# Patient Record
Sex: Female | Born: 1950
Health system: Southern US, Community
[De-identification: ages and names within clinical notes are randomized; demographics above are authoritative.]

## PROBLEM LIST (undated history)

## (undated) DIAGNOSIS — R001 Bradycardia, unspecified: Secondary | ICD-10-CM

## (undated) DIAGNOSIS — E876 Hypokalemia: Secondary | ICD-10-CM

## (undated) DIAGNOSIS — I10 Essential (primary) hypertension: Secondary | ICD-10-CM

## (undated) DIAGNOSIS — Z0000 Encounter for general adult medical examination without abnormal findings: Secondary | ICD-10-CM

## (undated) DIAGNOSIS — E559 Vitamin D deficiency, unspecified: Secondary | ICD-10-CM

## (undated) DIAGNOSIS — M810 Age-related osteoporosis without current pathological fracture: Secondary | ICD-10-CM

## (undated) DIAGNOSIS — D259 Leiomyoma of uterus, unspecified: Secondary | ICD-10-CM

## (undated) DIAGNOSIS — D508 Other iron deficiency anemias: Secondary | ICD-10-CM

## (undated) DIAGNOSIS — D539 Nutritional anemia, unspecified: Secondary | ICD-10-CM

## (undated) DIAGNOSIS — D513 Other dietary vitamin B12 deficiency anemia: Secondary | ICD-10-CM

## (undated) DIAGNOSIS — N950 Postmenopausal bleeding: Secondary | ICD-10-CM

## (undated) DIAGNOSIS — Z1231 Encounter for screening mammogram for malignant neoplasm of breast: Secondary | ICD-10-CM

## (undated) DIAGNOSIS — E785 Hyperlipidemia, unspecified: Secondary | ICD-10-CM

## (undated) DIAGNOSIS — R634 Abnormal weight loss: Secondary | ICD-10-CM

## (undated) HISTORY — DX: Encounter for general adult medical examination without abnormal findings: Z00.00

## (undated) HISTORY — DX: Encounter for screening mammogram for malignant neoplasm of breast: Z12.31

## (undated) HISTORY — DX: Vitamin D deficiency, unspecified: E55.9

## (undated) HISTORY — DX: Nutritional anemia, unspecified: D53.9

## (undated) HISTORY — DX: Postmenopausal bleeding: N95.0

## (undated) HISTORY — DX: Age-related osteoporosis without current pathological fracture: M81.0

## (undated) HISTORY — DX: Hyperlipidemia, unspecified: E78.5

## (undated) HISTORY — DX: Other iron deficiency anemias: D50.8

## (undated) HISTORY — DX: Bradycardia, unspecified: R00.1

## (undated) HISTORY — DX: Essential (primary) hypertension: I10

## (undated) HISTORY — DX: Leiomyoma of uterus, unspecified: D25.9

## (undated) HISTORY — DX: Abnormal weight loss: R63.4

## (undated) HISTORY — DX: Hypokalemia: E87.6

## (undated) HISTORY — DX: Other dietary vitamin B12 deficiency anemia: D51.3

---

## 1999-02-04 ENCOUNTER — Other Ambulatory Visit: Admission: RE | Admit: 1999-02-04 | Discharge: 1999-02-04 | Payer: Self-pay | Admitting: Obstetrics and Gynecology

## 2000-08-06 ENCOUNTER — Other Ambulatory Visit: Admission: RE | Admit: 2000-08-06 | Discharge: 2000-08-06 | Payer: Self-pay | Admitting: Obstetrics and Gynecology

## 2002-10-02 ENCOUNTER — Other Ambulatory Visit: Admission: RE | Admit: 2002-10-02 | Discharge: 2002-10-02 | Payer: Self-pay | Admitting: Obstetrics and Gynecology

## 2003-10-11 ENCOUNTER — Encounter: Admission: RE | Admit: 2003-10-11 | Discharge: 2003-10-11 | Payer: Self-pay | Admitting: Obstetrics and Gynecology

## 2003-10-11 ENCOUNTER — Encounter: Payer: Self-pay | Admitting: Obstetrics and Gynecology

## 2003-10-27 ENCOUNTER — Ambulatory Visit (HOSPITAL_COMMUNITY): Admission: RE | Admit: 2003-10-27 | Discharge: 2003-10-27 | Payer: Self-pay | Admitting: Interventional Radiology

## 2004-03-10 ENCOUNTER — Other Ambulatory Visit: Admission: RE | Admit: 2004-03-10 | Discharge: 2004-03-10 | Payer: Self-pay | Admitting: Obstetrics and Gynecology

## 2004-12-21 HISTORY — PX: BREAST CYST EXCISION: SHX579

## 2005-02-09 ENCOUNTER — Emergency Department (HOSPITAL_COMMUNITY): Admission: EM | Admit: 2005-02-09 | Discharge: 2005-02-09 | Payer: Self-pay | Admitting: Family Medicine

## 2005-05-12 ENCOUNTER — Other Ambulatory Visit: Admission: RE | Admit: 2005-05-12 | Discharge: 2005-05-12 | Payer: Self-pay | Admitting: Obstetrics and Gynecology

## 2006-10-25 ENCOUNTER — Ambulatory Visit: Payer: Self-pay | Admitting: Internal Medicine

## 2006-10-25 LAB — CONVERTED CEMR LAB
ALT: 13 units/L (ref 0–40)
Alkaline Phosphatase: 51 units/L (ref 39–117)
BUN: 14 mg/dL (ref 6–23)
Basophils Relative: 0.9 % (ref 0.0–1.0)
Bilirubin Urine: NEGATIVE
Calcium: 9.4 mg/dL (ref 8.4–10.5)
Chloride: 104 meq/L (ref 96–112)
Cholesterol: 173 mg/dL (ref 0–200)
Eosinophil percent: 3.4 % (ref 0.0–5.0)
Glomerular Filtration Rate, Af Am: 112 mL/min/{1.73_m2}
Glucose, Bld: 91 mg/dL (ref 70–99)
HCT: 40 % (ref 36.0–46.0)
HDL: 61.2 mg/dL (ref >39.0)
Hemoglobin, Urine: NEGATIVE
Hemoglobin: 13.2 g/dL (ref 12.0–15.0)
LDL Cholesterol: 106 mg/dL — ABNORMAL HIGH (ref 0–99)
Leukocytes, UA: NEGATIVE
Lymphocytes Relative: 34.6 % (ref 12.0–46.0)
MCV: 83.2 fL (ref 78.0–100.0)
Monocytes Absolute: 0.4 10*3/uL (ref 0.2–0.7)
Neutrophils Relative %: 48.9 % (ref 43.0–77.0)
Platelets: 235 10*3/uL (ref 150–400)
Potassium: 3.5 meq/L (ref 3.5–5.1)
TSH: 0.62 microintl units/mL (ref 0.35–5.50)
Total Protein: 7.6 g/dL (ref 6.0–8.3)
Triglyceride fasting, serum: 30 mg/dL (ref 0–149)
VLDL: 6 mg/dL (ref 0–40)
WBC: 3.2 10*3/uL — ABNORMAL LOW (ref 4.5–10.5)
pH: 7 (ref 5.0–8.0)

## 2006-11-01 ENCOUNTER — Ambulatory Visit: Payer: Self-pay | Admitting: Internal Medicine

## 2007-05-11 ENCOUNTER — Other Ambulatory Visit: Admission: RE | Admit: 2007-05-11 | Discharge: 2007-05-11 | Payer: Self-pay | Admitting: Obstetrics and Gynecology

## 2008-03-21 ENCOUNTER — Encounter: Payer: Self-pay | Admitting: Internal Medicine

## 2008-04-17 ENCOUNTER — Encounter: Payer: Self-pay | Admitting: Internal Medicine

## 2008-10-02 ENCOUNTER — Ambulatory Visit: Payer: Self-pay | Admitting: Obstetrics and Gynecology

## 2008-10-02 ENCOUNTER — Encounter: Payer: Self-pay | Admitting: Obstetrics and Gynecology

## 2008-10-02 ENCOUNTER — Other Ambulatory Visit: Admission: RE | Admit: 2008-10-02 | Discharge: 2008-10-02 | Payer: Self-pay | Admitting: Obstetrics and Gynecology

## 2009-01-08 ENCOUNTER — Ambulatory Visit: Payer: Self-pay | Admitting: Obstetrics and Gynecology

## 2009-05-24 ENCOUNTER — Emergency Department (HOSPITAL_COMMUNITY): Admission: EM | Admit: 2009-05-24 | Discharge: 2009-05-25 | Payer: Self-pay | Admitting: Emergency Medicine

## 2010-01-22 ENCOUNTER — Encounter: Payer: Self-pay | Admitting: Internal Medicine

## 2010-02-21 IMAGING — CR DG FOOT COMPLETE 3+V*L*
3 series · 3 of 3 positions shown · non-contrast
Comparison: None.

CLINICAL DATA: Lateral left foot pain following a fall.

LEFT FOOT - COMPLETE 3+ VIEW

[t foot ap left]
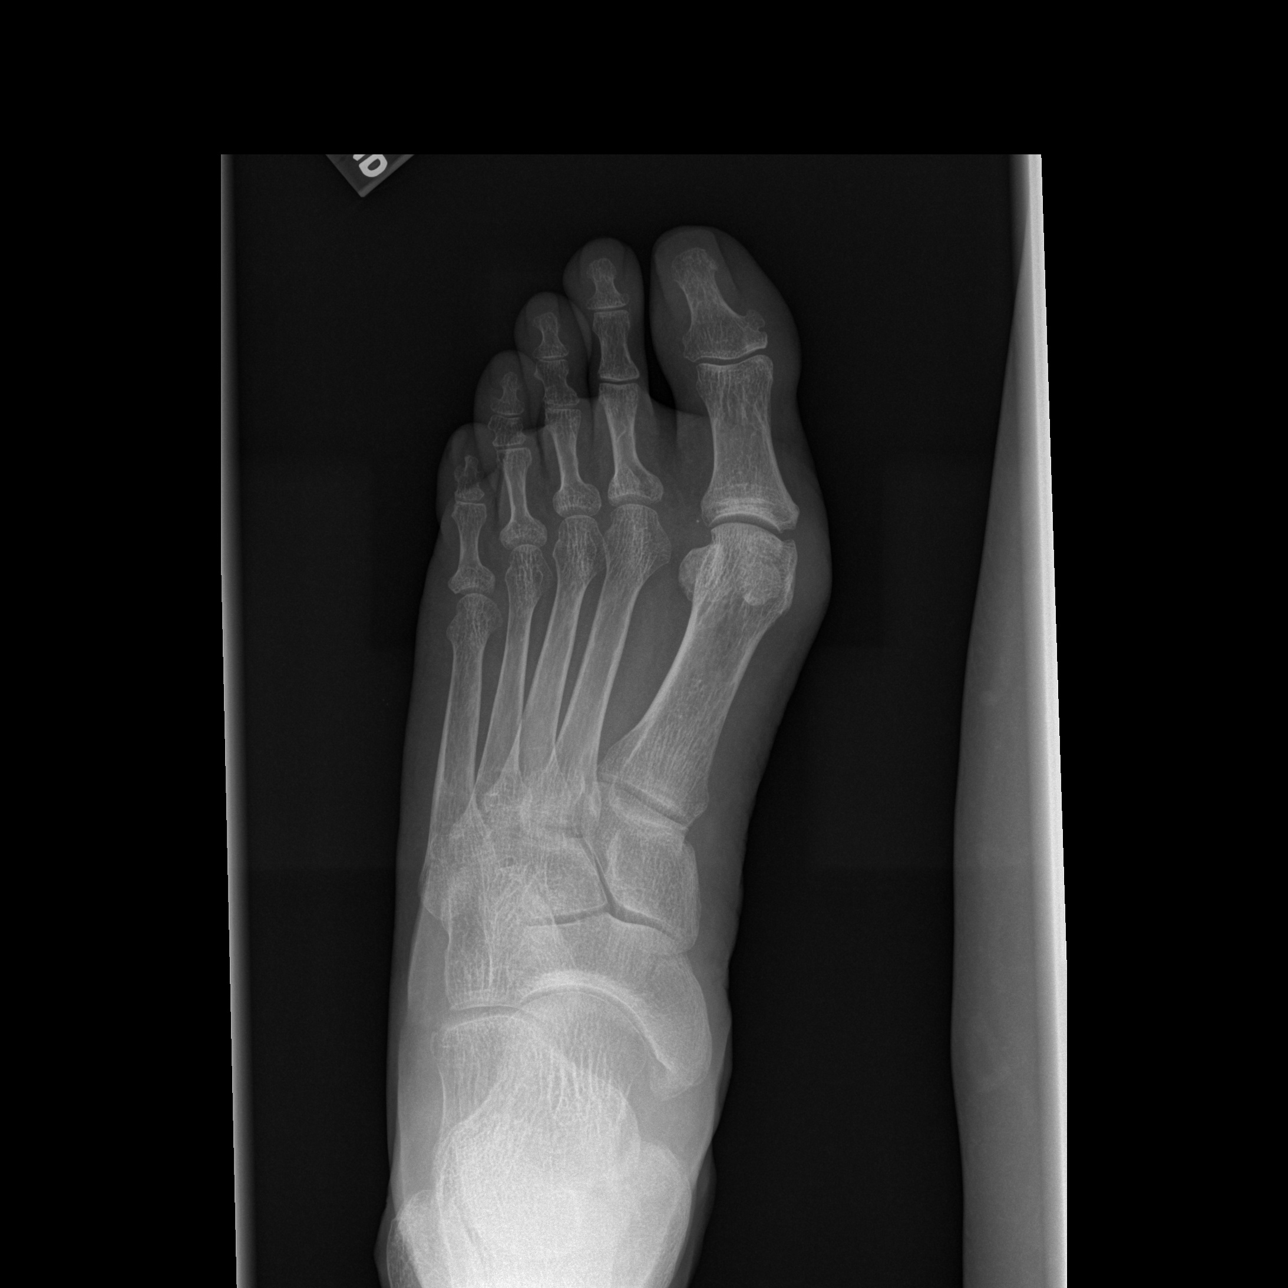

[t foot oblique left]
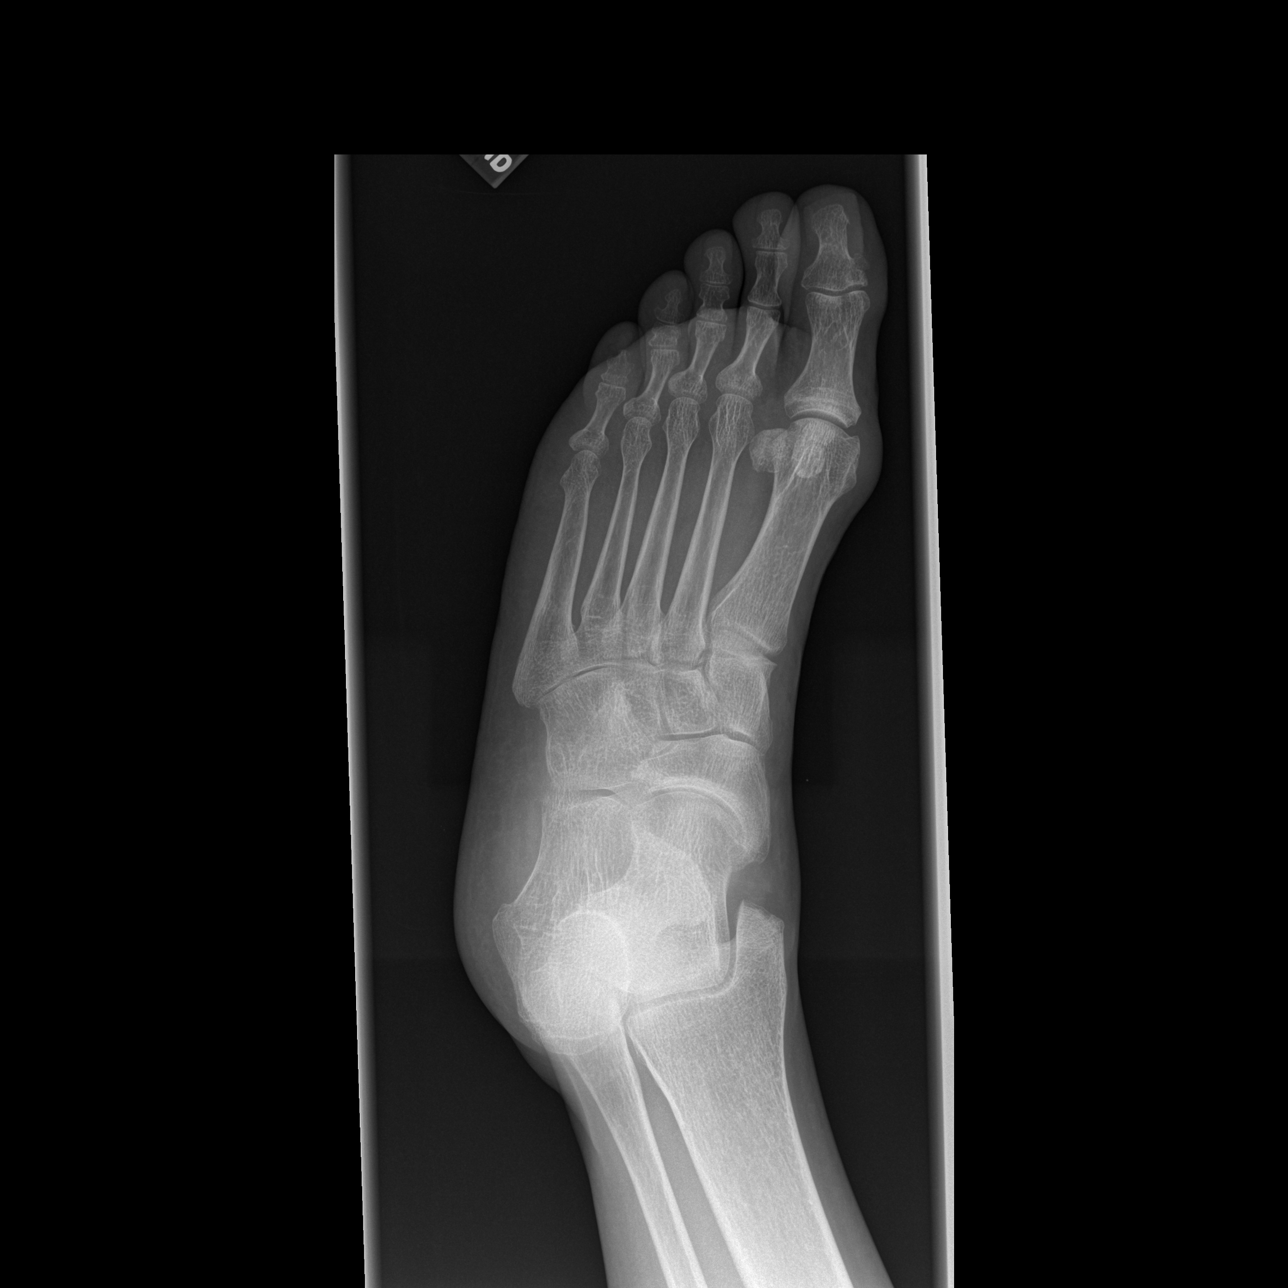

[t foot lat left]
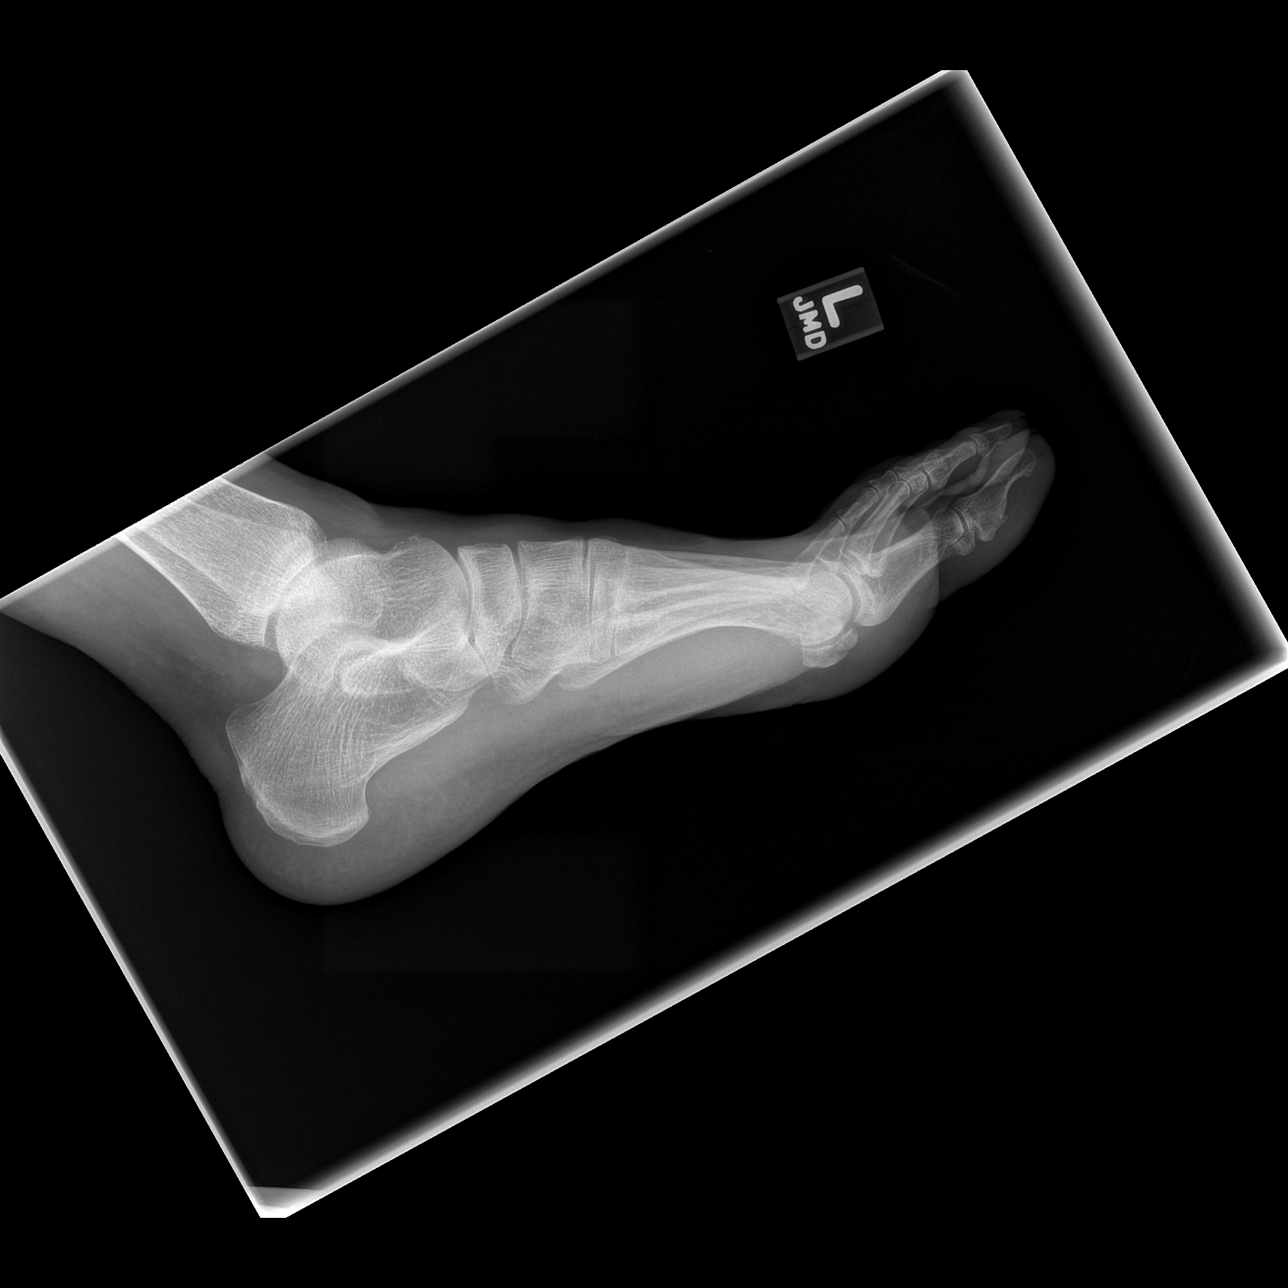

[3 of 3 positions shown; findings below may reference images not displayed]

FINDINGS: Mild hallux valgus deformity and mild spur formation at
the medial aspect of the first MTP joint.  No fracture or
dislocation seen.
IMPRESSION: 1.  No fracture.
2.  Mild hallux valgus deformity and mild degenerative changes of
the first MTP joint.

## 2010-07-02 ENCOUNTER — Emergency Department (HOSPITAL_COMMUNITY): Admission: EM | Admit: 2010-07-02 | Discharge: 2010-07-02 | Payer: Self-pay | Admitting: Family Medicine

## 2010-11-18 ENCOUNTER — Ambulatory Visit: Payer: Self-pay | Admitting: Obstetrics and Gynecology

## 2010-11-18 ENCOUNTER — Other Ambulatory Visit
Admission: RE | Admit: 2010-11-18 | Discharge: 2010-11-18 | Payer: Self-pay | Source: Home / Self Care | Admitting: Obstetrics and Gynecology

## 2011-01-10 ENCOUNTER — Encounter: Payer: Self-pay | Admitting: Interventional Radiology

## 2011-03-03 ENCOUNTER — Other Ambulatory Visit: Payer: Self-pay

## 2011-03-10 ENCOUNTER — Encounter: Payer: Self-pay | Admitting: Internal Medicine

## 2011-05-06 ENCOUNTER — Other Ambulatory Visit: Payer: Self-pay

## 2011-05-06 ENCOUNTER — Other Ambulatory Visit: Payer: Self-pay | Admitting: Internal Medicine

## 2011-05-06 DIAGNOSIS — Z0000 Encounter for general adult medical examination without abnormal findings: Secondary | ICD-10-CM

## 2011-05-08 NOTE — Assessment & Plan Note (Signed)
[facility]                             PRIMARY CARE OFFICE NOTE   NAME:Susan Mccarty, Susan Mccarty                        MRN:          [id number]  DATE:11/01/2006                            DOB:          1951-06-21    Ms. Marcotte is a 60 year old African-American woman who presents for followup  evaluation and exam. She was last seen in the office May 09, 2002. In the  interval, she has been doing very well with no major medical problems.   The patient has been followed by OB/GYN, Dr. Eda Paschal. She does have a  history of fibroid tumors. The patient now is post menopausal and reports  actually that her menorrhagia and discomfort is markedly improved.   HEALTH MAINTENANCE:  The patient had a mammogram August of 2007 which was  normal. The patient had an ophthalmological exam December 2006 which was  unremarkable.   PAST MEDICAL HISTORY:  Surgery:  The patient had an in-office mole excision  with repeated wider excision for a melanocytic nevus. No other surgeries are  reported.   Medical:  1. The patient has a history of anemia secondary to menometrorrhagia. She      has had uterine fibroids.  2. She had suspicious lesion as noted that was excised.  3. She has had mild leukopenia in years past.   FAMILY HISTORY:  Positive for diabetes. Otherwise unremarkable.   SOCIAL HISTORY:  The patient has no children. The patient had worked  previously as a Neurosurgeon. Additional social history was not obtained, as  updated.   REVIEW OF SYSTEMS:  The patient has had no constitutional, ophthalmology,  ENT, cardiovascular, respiratory, GI problems. Genitourinary as noted. No  musculoskeletal or dermatologic problems.   PHYSICAL EXAMINATION:  With the assistance of Stevphen Meuse, F.N.P.  student, temperature is 96.6, blood pressure 112/68, pulse 86, weight 157.  GENERAL APPEARANCE:  A well-nourished, well-groomed woman in no acute  distress. She looks her stated  age in no acute distress.  HEENT:  Normocephalic, atraumatic. EACs and TMs were unremarkable.  Oropharynx with native dentition in good repair. No buccal or palatal  lesions were noted. Posterior pharynx was clear. Conjunctivae and sclerae  were clear. Pupils are equal, round, and reactive to light and  accommodation. Funduscopic exam with handheld instrument was unremarkable.  NECK:  Supple without thyromegaly.  NODES:  No adenopathy was noted in the cervical or supraclavicular regions.  CHEST:  With CVA tenderness.  LUNGS:  Clear with no rales, wheezes, or rhonchi.  CARDIOVASCULAR:  2+ radial pulses. She had no JVD or carotid bruit. She had  a quiet precordium with regular rate and rhythm without murmurs, rubs or  gallops.  BREASTS:  Deferred to gynecology.  ABDOMEN:  Soft. No guarding or rebound. No organosplenomegaly was  appreciated.  PELVIC/RECTAL EXAMS:  Deferred to gynecology.  EXTREMITIES:  Without clubbing, cyanosis, edema or deformity.  NEUROLOGICAL:  Nonfocal.  SKIN:  Clear.   LABORATORY DATA:  Hemoglobin 13.2 g, white count was 3200 with a normal  differential. Cholesterol 173, triglycerides 30, HDL 61.2, LDL 106.  Chemistries were normal with a blood sugar of 91, kidney function with a  creatinine of 0.9 and GFR of 112 mm per minute. Liver functions were normal.  TSH was normal at 0.62. Urinalysis was normal.   ASSESSMENT AND PLAN:  1. Gynecology. The patient with uterine fibroids that currently are      without bleeding or discomfort. Her anemia has resolved. Plan:      Continue follow up with Dr. Eda Paschal.  2. Health maintenance. The patient is up to date with Dr. Eda Paschal. She      is up to date with mammography. She is a candidate for colorectal      carcinoma screening given her age of 27 and will be referred to      gastroenterology for the same. The patient is otherwise asked to return      to see [place] on a p.r.n. basis or in 2 years.   The patient is wished  bon voyage on her trip to [place] coming up in the  near future.     Rosalyn Gess Norins, MD  Electronically Signed    MEN/MedQ  DD: 11/02/2006  DT: 11/02/2006  Job #: [id number]   cc:   America Brown

## 2011-05-13 ENCOUNTER — Encounter: Payer: Self-pay | Admitting: Internal Medicine

## 2011-07-08 ENCOUNTER — Other Ambulatory Visit: Payer: Self-pay

## 2011-07-08 LAB — HM PAP SMEAR: HM PAP: NORMAL

## 2011-07-15 ENCOUNTER — Other Ambulatory Visit: Payer: Self-pay

## 2011-07-15 ENCOUNTER — Encounter: Payer: Self-pay | Admitting: Internal Medicine

## 2011-07-21 ENCOUNTER — Encounter: Payer: Self-pay | Admitting: Internal Medicine

## 2011-09-05 ENCOUNTER — Other Ambulatory Visit: Payer: Self-pay | Admitting: Internal Medicine

## 2011-09-05 DIAGNOSIS — Z0000 Encounter for general adult medical examination without abnormal findings: Secondary | ICD-10-CM

## 2011-09-15 ENCOUNTER — Other Ambulatory Visit: Payer: Self-pay

## 2011-09-16 ENCOUNTER — Other Ambulatory Visit (INDEPENDENT_AMBULATORY_CARE_PROVIDER_SITE_OTHER): Payer: Self-pay

## 2011-09-16 DIAGNOSIS — Z0000 Encounter for general adult medical examination without abnormal findings: Secondary | ICD-10-CM

## 2011-09-16 LAB — URINALYSIS, ROUTINE W REFLEX MICROSCOPIC
Leukocytes, UA: NEGATIVE
Nitrite: NEGATIVE
Specific Gravity, Urine: 1.025 (ref 1.000–1.030)
Urobilinogen, UA: 0.2 (ref 0.0–1.0)

## 2011-09-16 LAB — TSH: TSH: 0.81 u[IU]/mL (ref 0.35–5.50)

## 2011-09-16 LAB — COMPREHENSIVE METABOLIC PANEL
ALT: 13 U/L (ref 0–35)
AST: 21 U/L (ref 0–37)
Albumin: 4.1 g/dL (ref 3.5–5.2)
BUN: 14 mg/dL (ref 6–23)
CO2: 31 mEq/L (ref 19–32)
Calcium: 9.3 mg/dL (ref 8.4–10.5)
Chloride: 104 mEq/L (ref 96–112)
Potassium: 4.4 mEq/L (ref 3.5–5.1)

## 2011-09-16 LAB — CBC WITH DIFFERENTIAL/PLATELET
Eosinophils Relative: 3 % (ref 0.0–5.0)
HCT: 37.8 % (ref 36.0–46.0)
Lymphs Abs: 0.9 10*3/uL (ref 0.7–4.0)
MCV: 83.4 fl (ref 78.0–100.0)
Monocytes Absolute: 0.4 10*3/uL (ref 0.1–1.0)
Platelets: 202 10*3/uL (ref 150.0–400.0)
RDW: 13.4 % (ref 11.5–14.6)
WBC: 2.1 10*3/uL — ABNORMAL LOW (ref 4.5–10.5)

## 2011-09-16 LAB — LIPID PANEL: Total CHOL/HDL Ratio: 3

## 2011-09-22 ENCOUNTER — Ambulatory Visit (INDEPENDENT_AMBULATORY_CARE_PROVIDER_SITE_OTHER): Payer: BC Managed Care – PPO | Admitting: Internal Medicine

## 2011-09-22 VITALS — BP 158/84 | HR 50 | Temp 97.9°F | Wt 148.0 lb

## 2011-09-22 DIAGNOSIS — H409 Unspecified glaucoma: Secondary | ICD-10-CM

## 2011-09-22 DIAGNOSIS — Z136 Encounter for screening for cardiovascular disorders: Secondary | ICD-10-CM

## 2011-09-22 DIAGNOSIS — Z1211 Encounter for screening for malignant neoplasm of colon: Secondary | ICD-10-CM

## 2011-09-22 DIAGNOSIS — Z0000 Encounter for general adult medical examination without abnormal findings: Secondary | ICD-10-CM

## 2011-09-22 NOTE — Patient Instructions (Signed)
Looks like you are headed to be a centurion - in great health. We will work on the colonoscopy. You think about the immunizations. You need another physical exam in 3 years.

## 2011-09-26 ENCOUNTER — Encounter: Payer: Self-pay | Admitting: Internal Medicine

## 2011-09-26 DIAGNOSIS — H409 Unspecified glaucoma: Secondary | ICD-10-CM | POA: Insufficient documentation

## 2011-09-26 DIAGNOSIS — Z0000 Encounter for general adult medical examination without abnormal findings: Secondary | ICD-10-CM

## 2011-09-26 HISTORY — DX: Encounter for general adult medical examination without abnormal findings: Z00.00

## 2011-09-26 NOTE — Progress Notes (Signed)
Subjective:    Patient ID: Susan Mccarty, female    DOB: Nov 22, 1951, 60 y.o.   MRN: [id number]  HPI Susan Mccarty has recently re-enrolled with her insurance and presents for a wellness exam. She is feeling well and has no complaints medically. She does check her BP at home and reports that her BP is usually well controlled.   Past Medical History  Diagnosis Date  . Glaucoma    Past Surgical History  Procedure Date  . None    Family History  Problem Relation Age of Onset  . Diabetes Mother   . Alcohol abuse Mother   . Hypertension Father    History   Social History  . Marital Status: Single    Spouse Name: N/A    Number of Children: 0  . Years of Education: 12   Occupational History  . [employer]    Social History Main Topics  . Smoking status: Never Smoker   . Smokeless tobacco: Never Used  . Alcohol Use: Yes     rare use  . Drug Use: No  . Sexually Active: Yes -- Female partner(s)   Other Topics Concern  . Not on file   Social History Narrative   HSG, no college. Single - in a relationship. No children. Work - Neurosurgeon. Exercises daily. No history of abuse.       Review of Systems Constitutional:  Negative for fever, chills, activity change and unexpected weight change.  HEENT:  Negative for hearing loss, ear pain, congestion, neck stiffness and postnasal drip. Negative for sore throat or swallowing problems. Negative for dental complaints.   Eyes: Negative for vision loss. Blurry vision at night.  Respiratory: Negative for chest tightness and wheezing. Negative for DOE.   Cardiovascular: Negative for chest pain or palpitations. No decreased exercise tolerance Gastrointestinal: No change in bowel habit. No bloating or gas. No reflux or indigestion Genitourinary: Negative for urgency, frequency, flank pain and difficulty urinating.  Musculoskeletal: Negative for myalgias, back pain, arthralgias and gait problem.  Neurological: Negative for dizziness, tremors,  weakness and headaches.  Hematological: Negative for adenopathy.  Psychiatric/Behavioral: Negative for behavioral problems and dysphoric mood.       Objective:   Physical Exam Vitals reviewed. Gen'l: well nourished, well developed AA woman in no distress HEENT - [place]/AT, EACs/TMs normal, oropharynx with native dentition in good condition, no buccal or palatal lesions, posterior pharynx clear, mucous membranes moist. C&S clear, PERRLA, fundi - normal Neck - supple, no thyromegaly Nodes- negative submental, cervical, supraclavicular regions Chest - no deformity, no CVAT Lungs - cleat without rales, wheezes. No increased work of breathing Breast - deferred to gyn Cardiovascular - regular rate and rhythm, quiet precordium, no murmurs, rubs or gallops, 2+ radial, DP and PT pulses Abdomen - BS+ x 4, no HSM, no guarding or rebound or tenderness Pelvic - deferred to gyn Rectal - deferred to gyn Extremities - no clubbing, cyanosis, edema or deformity.  Neuro - A&O x 3, CN II-XII normal, motor strength normal and equal, DTRs 2+ and symmetrical biceps, radial, and patellar tendons. Cerebellar - no tremor, no rigidity, fluid movement and normal gait. Derm - Head, neck, back, abdomen and extremities without suspicious lesions  Lab Results  Component Value Date   WBC 2.1* 09/16/2011   HGB 12.5 09/16/2011   HCT 37.8 09/16/2011   PLT 202.0 09/16/2011   GLUCOSE 88 09/16/2011   CHOL 187 09/16/2011   TRIG 29.0 09/16/2011   HDL 69.00 09/16/2011  LDLCALC 112* 09/16/2011   ALT 13 09/16/2011   AST 21 09/16/2011   NA 140 09/16/2011   K 4.4 09/16/2011   CL 104 09/16/2011   CREATININE 0.6 09/16/2011   BUN 14 09/16/2011   CO2 31 09/16/2011   TSH 0.81 09/16/2011   12 Lead EKG - sinus bradycardia otherwise normal with no sign of ischemia or injury.        Assessment & Plan:

## 2011-09-26 NOTE — Assessment & Plan Note (Signed)
Patient with a very benign medical history. Physical exam, sans pelvic and breast exam, is normal. Lab results reveal a low white blood count, cholesterol values in normal range, normal blood sugar. She is current with gyn for routine well woman care. She has mammograms at [facility] and is current. She is due for colorectal cancer screening and is willing to be referred. She declines flu immunization. She is provided information about flu shots and shingles vaccine and pneumonia vaccine.  In summary - a very nice healthy woman. She has great longevity in her 2nd degree kinship. She will return as needed or in one year for follow-up but will not need labs or a complete exam for 3 years.

## 2011-09-26 NOTE — Assessment & Plan Note (Signed)
Followed by opthalmology and her vision is stable.

## 2011-11-30 ENCOUNTER — Encounter: Payer: Self-pay | Admitting: Gastroenterology

## 2012-03-06 ENCOUNTER — Emergency Department (INDEPENDENT_AMBULATORY_CARE_PROVIDER_SITE_OTHER)
Admission: EM | Admit: 2012-03-06 | Discharge: 2012-03-06 | Disposition: A | Discharge status: Home / Self Care | Payer: Self-pay | Source: Home / Self Care | Attending: Emergency Medicine | Admitting: Emergency Medicine

## 2012-03-06 ENCOUNTER — Encounter (HOSPITAL_COMMUNITY): Payer: Self-pay

## 2012-03-06 DIAGNOSIS — J069 Acute upper respiratory infection, unspecified: Secondary | ICD-10-CM

## 2012-03-06 MED ORDER — GUAIFENESIN-CODEINE 100-10 MG/5ML PO SYRP: 5.0000 mL | ORAL_SOLUTION | Freq: Three times a day (TID) | ORAL | Status: AC | PRN

## 2012-03-06 NOTE — ED Provider Notes (Signed)
History     CSN: [id number]  Arrival date & time 03/06/12  1004   First MD Initiated Contact with Patient 03/06/12 1059      Chief Complaint  Patient presents with  . Influenza    (Consider location/radiation/quality/duration/timing/severity/associated sxs/prior treatment) HPI Comments: Patient presents urgent care complaining of cough, stuffy congested nose with a headache and body aches for 2 days. No shortness of breath been taking Tylenol for her symptoms with mild alleviation of her congestion but feels her cough persist and body aches. Tactile fevers at home  Patient denies any shortness of breath and no gastrointestinal symptoms such as diarrhea or vomiting. Feeling tired  Patient is a 61 y.o. female presenting with flu symptoms. The history is provided by the patient.  Influenza This is a new problem. The current episode started 2 days ago. The problem occurs constantly. The problem has been gradually worsening. Pertinent negatives include no abdominal pain and no shortness of breath. The symptoms are aggravated by nothing. The symptoms are relieved by nothing. She has tried nothing for the symptoms. The treatment provided no relief.    Past Medical History  Diagnosis Date  . Glaucoma   . Glaucoma     Past Surgical History  Procedure Date  . None     Family History  Problem Relation Age of Onset  . Diabetes Mother   . Alcohol abuse Mother   . Hypertension Father     History  Substance Use Topics  . Smoking status: Never Smoker   . Smokeless tobacco: Never Used  . Alcohol Use: Yes     rare use    OB History    Grav Para Term Preterm Abortions TAB SAB Ect Mult Living                  Review of Systems  Constitutional: Positive for fever, appetite change and fatigue.  HENT: Positive for congestion and rhinorrhea.   Respiratory: Positive for cough. Negative for shortness of breath and wheezing.   Gastrointestinal: Negative for nausea, abdominal pain and  diarrhea.  Genitourinary: Negative for dysuria.  Musculoskeletal: Positive for myalgias.  Skin: Negative for rash.  Neurological: Negative for weakness.    Allergies  Review of patient's allergies indicates no known allergies.  Home Medications   Current Outpatient Rx  Name Route Sig Dispense Refill  . DORZOLAMIDE HCL-TIMOLOL MAL 22.3-6.8 MG/ML OP SOLN  1 drop 2 (two) times daily.    Marland Kitchen PSEUDOEPHEDRINE-ACETAMINOPHEN 30-500 MG PO TABS Oral Take 1 tablet by mouth every 4 (four) hours as needed.    . GUAIFENESIN-CODEINE 100-10 MG/5ML PO SYRP Oral Take 5 mLs by mouth 3 (three) times daily as needed for cough. 120 mL 0    BP 142/74  Pulse 68  Temp(Src) 98.4 F (36.9 C) (Oral)  Resp 16  SpO2 99%  Physical Exam  Vitals reviewed. Constitutional: She appears well-developed and well-nourished. No distress.  HENT:  Head: Normocephalic.  Nose: Nose normal.  Mouth/Throat: Uvula is midline and mucous membranes are normal. Posterior oropharyngeal erythema present. No oropharyngeal exudate or posterior oropharyngeal edema.  Eyes: Conjunctivae are normal. No scleral icterus.  Neck: Neck supple. No JVD present.  Cardiovascular: Normal rate and regular rhythm.   No murmur heard. Pulmonary/Chest: No respiratory distress. She has no wheezes. She has no rales. She exhibits no tenderness.  Lymphadenopathy:    She has no cervical adenopathy.  Skin: Skin is warm.    ED Course  Procedures (including critical care  time)  Labs Reviewed - No data to display No results found.   1. Upper respiratory infection       MDM  Patient with upper respiratory symptoms for less than 48 hours normal lung exam comfortable afebrile symptom        Jimmie Molly, MD 03/06/12 1208

## 2012-03-06 NOTE — Discharge Instructions (Signed)
AS. discuss your symptoms and exam were consistent with a virus with the infection take this prescribe suspension as discussed stay well hydrated. If worsening symptoms such as shortness of breath and persistent fevers, persistent bouts of days will need followup with your primary care Dr. or [place] for further evaluation    Antibiotic Nonuse  Your caregiver felt that the infection or problem was not one that would be helped with an antibiotic. Infections may be caused by viruses or bacteria. Only a caregiver can tell which one of these is the likely cause of an illness. A cold is the most common cause of infection in both adults and children. A cold is a virus. Antibiotic treatment will have no effect on a viral infection. Viruses can lead to many lost days of work caring for sick children and many missed days of school. Children may catch as many as 10 "colds" or "flus" per year during which they can be tearful, cranky, and uncomfortable. The goal of treating a virus is aimed at keeping the ill person comfortable. Antibiotics are medications used to help the body fight bacterial infections. There are relatively few types of bacteria that cause infections but there are hundreds of viruses. While both viruses and bacteria cause infection they are very different types of germs. A viral infection will typically go away by itself within 7 to 10 days. Bacterial infections may spread or get worse without antibiotic treatment. Examples of bacterial infections are:  Sore throats (like strep throat or tonsillitis).   Infection in the lung (pneumonia).   Ear and skin infections.  Examples of viral infections are:  Colds or flus.   Most coughs and bronchitis.   Sore throats not caused by Strep.   Runny noses.  It is often best not to take an antibiotic when a viral infection is the cause of the problem. Antibiotics can kill off the helpful bacteria that we have inside our body and allow harmful bacteria to  start growing. Antibiotics can cause side effects such as allergies, nausea, and diarrhea without helping to improve the symptoms of the viral infection. Additionally, repeated uses of antibiotics can cause bacteria inside of our body to become resistant. That resistance can be passed onto harmful bacterial. The next time you have an infection it may be harder to treat if antibiotics are used when they are not needed. Not treating with antibiotics allows our own immune system to develop and take care of infections more efficiently. Also, antibiotics will work better for [place] when they are prescribed for bacterial infections. Treatments for a child that is ill may include:  Give extra fluids throughout the day to stay hydrated.   Get plenty of rest.   Only give your child over-the-counter or prescription medicines for pain, discomfort, or fever as directed by your caregiver.   The use of a cool mist humidifier may help stuffy noses.   Cold medications if suggested by your caregiver.  Your caregiver may decide to start you on an antibiotic if:  The problem you were seen for today continues for a longer length of time than expected.   You develop a secondary bacterial infection.  SEEK MEDICAL CARE IF:  Fever lasts longer than 5 days.   Symptoms continue to get worse after 5 to 7 days or become severe.   Difficulty in breathing develops.   Signs of dehydration develop (poor drinking, rare urinating, dark colored urine).   Changes in behavior or worsening tiredness (listlessness or  lethargy).  Document Released: 02/15/2002 Document Revised: 11/26/2011 Document Reviewed: 08/14/2009 [facility] Patient Information 2012 [place], [place].

## 2012-03-06 NOTE — ED Notes (Signed)
Pt has cough, headache and bodyaches for two days.

## 2013-12-26 ENCOUNTER — Encounter: Payer: Self-pay | Admitting: Internal Medicine

## 2014-05-16 ENCOUNTER — Other Ambulatory Visit (INDEPENDENT_AMBULATORY_CARE_PROVIDER_SITE_OTHER): Payer: No Typology Code available for payment source

## 2014-05-16 ENCOUNTER — Encounter: Payer: Self-pay | Admitting: Physician Assistant

## 2014-05-16 ENCOUNTER — Encounter: Payer: Self-pay | Admitting: Internal Medicine

## 2014-05-16 ENCOUNTER — Ambulatory Visit (INDEPENDENT_AMBULATORY_CARE_PROVIDER_SITE_OTHER): Payer: No Typology Code available for payment source | Admitting: Internal Medicine

## 2014-05-16 VITALS — BP 118/80 | HR 50 | Temp 98.2°F | Resp 16 | Ht 68.5 in | Wt 142.4 lb

## 2014-05-16 DIAGNOSIS — E785 Hyperlipidemia, unspecified: Secondary | ICD-10-CM

## 2014-05-16 DIAGNOSIS — Z0000 Encounter for general adult medical examination without abnormal findings: Secondary | ICD-10-CM

## 2014-05-16 LAB — CBC WITH DIFFERENTIAL/PLATELET
BASOS ABS: 0 10*3/uL (ref 0.0–0.1)
Basophils Relative: 0.8 % (ref 0.0–3.0)
EOS PCT: 3.3 % (ref 0.0–5.0)
Eosinophils Absolute: 0.1 10*3/uL (ref 0.0–0.7)
HEMATOCRIT: 37.6 % (ref 36.0–46.0)
HEMOGLOBIN: 12.4 g/dL (ref 12.0–15.0)
LYMPHS ABS: 1 10*3/uL (ref 0.7–4.0)
LYMPHS PCT: 34.1 % (ref 12.0–46.0)
MCHC: 33 g/dL (ref 30.0–36.0)
MCV: 82.7 fl (ref 78.0–100.0)
MONOS PCT: 13.7 % — AB (ref 3.0–12.0)
Monocytes Absolute: 0.4 10*3/uL (ref 0.1–1.0)
Neutro Abs: 1.4 10*3/uL (ref 1.4–7.7)
Neutrophils Relative %: 48.1 % (ref 43.0–77.0)
PLATELETS: 205 10*3/uL (ref 150.0–400.0)
RBC: 4.55 Mil/uL (ref 3.87–5.11)
RDW: 13.8 % (ref 11.5–15.5)
WBC: 2.9 10*3/uL — AB (ref 4.0–10.5)

## 2014-05-16 LAB — COMPREHENSIVE METABOLIC PANEL
ALT: 13 U/L (ref 0–35)
AST: 19 U/L (ref 0–37)
Albumin: 3.8 g/dL (ref 3.5–5.2)
Alkaline Phosphatase: 53 U/L (ref 39–117)
BILIRUBIN TOTAL: 0.9 mg/dL (ref 0.2–1.2)
BUN: 17 mg/dL (ref 6–23)
CALCIUM: 9.4 mg/dL (ref 8.4–10.5)
CHLORIDE: 104 meq/L (ref 96–112)
CO2: 30 meq/L (ref 19–32)
CREATININE: 0.6 mg/dL (ref 0.4–1.2)
GFR: 120.65 mL/min (ref >60.00)
GLUCOSE: 90 mg/dL (ref 70–99)
Potassium: 4.5 mEq/L (ref 3.5–5.1)
Sodium: 139 mEq/L (ref 135–145)
TOTAL PROTEIN: 7.2 g/dL (ref 6.0–8.3)

## 2014-05-16 LAB — LIPID PANEL
Cholesterol: 182 mg/dL (ref 0–200)
HDL: 62.5 mg/dL (ref >39.00)
LDL Cholesterol: 112 mg/dL — ABNORMAL HIGH (ref 0–99)
TRIGLYCERIDES: 37 mg/dL (ref 0.0–149.0)
Total CHOL/HDL Ratio: 3
VLDL: 7.4 mg/dL (ref 0.0–40.0)

## 2014-05-16 LAB — TSH: TSH: 0.86 u[IU]/mL (ref 0.35–4.50)

## 2014-05-16 NOTE — Progress Notes (Signed)
   Subjective:    Patient ID: Susan Mccarty, female    DOB: 1951/11/22, 63 y.o.   MRN: [id number]  Hyperlipidemia This is a chronic problem. This is a new diagnosis. The problem is controlled. Recent lipid tests were reviewed and are variable. She has no history of chronic renal disease, diabetes, hypothyroidism, liver disease, obesity or nephrotic syndrome. There are no known factors aggravating her hyperlipidemia. Pertinent negatives include no chest pain, focal sensory loss, focal weakness, leg pain, myalgias or shortness of breath. She is currently on no antihyperlipidemic treatment. There are no compliance problems.       Review of Systems  Constitutional: Negative.  Negative for fever, chills, diaphoresis, appetite change and fatigue.  HENT: Negative.   Eyes: Negative.   Respiratory: Negative.  Negative for cough, choking, chest tightness, shortness of breath, wheezing and stridor.   Cardiovascular: Negative for chest pain, palpitations and leg swelling.  Gastrointestinal: Negative.  Negative for nausea, vomiting, abdominal pain, diarrhea, constipation and anal bleeding.  Endocrine: Negative.   Genitourinary: Negative.   Musculoskeletal: Negative.  Negative for arthralgias, back pain, joint swelling, myalgias and neck stiffness.  Skin: Negative.   Allergic/Immunologic: Negative.   Neurological: Negative.  Negative for dizziness, tremors, focal weakness, facial asymmetry, weakness and light-headedness.  Hematological: Negative.  Negative for adenopathy. Does not bruise/bleed easily.  Psychiatric/Behavioral: Negative.        Objective:   Physical Exam  Vitals reviewed. Constitutional: She is oriented to person, place, and time. She appears well-developed and well-nourished. No distress.  HENT:  Head: Normocephalic and atraumatic.  Mouth/Throat: Oropharynx is clear and moist. No oropharyngeal exudate.  Eyes: Conjunctivae are normal. Right eye exhibits no discharge. Left eye  exhibits no discharge. No scleral icterus.  Neck: Normal range of motion. Neck supple. No JVD present. No tracheal deviation present. No thyromegaly present.  Cardiovascular: Normal rate, regular rhythm, normal heart sounds and intact distal pulses.  Exam reveals no gallop and no friction rub.   No murmur heard. Pulmonary/Chest: Effort normal and breath sounds normal. No stridor. No respiratory distress. She has no wheezes. She has no rales. She exhibits no tenderness.  Abdominal: Soft. Bowel sounds are normal. She exhibits no distension and no mass. There is no tenderness. There is no rebound and no guarding.  Musculoskeletal: Normal range of motion. She exhibits no edema and no tenderness.  Lymphadenopathy:    She has no cervical adenopathy.  Neurological: She is oriented to person, place, and time.  Skin: Skin is warm and dry. No rash noted. She is not diaphoretic. No erythema. No pallor.  Psychiatric: She has a normal mood and affect. Her behavior is normal. Judgment and thought content normal.     Lab Results  Component Value Date   WBC 2.1* 09/16/2011   HGB 12.5 09/16/2011   HCT 37.8 09/16/2011   PLT 202.0 09/16/2011   GLUCOSE 88 09/16/2011   CHOL 187 09/16/2011   TRIG 29.0 09/16/2011   HDL 69.00 09/16/2011   LDLCALC 112* 09/16/2011   ALT 13 09/16/2011   AST 21 09/16/2011   NA 140 09/16/2011   K 4.4 09/16/2011   CL 104 09/16/2011   CREATININE 0.6 09/16/2011   BUN 14 09/16/2011   CO2 31 09/16/2011   TSH 0.81 09/16/2011       Assessment & Plan:

## 2014-05-16 NOTE — Progress Notes (Signed)
Pre visit review using our clinic review tool, if applicable. No additional management support is needed unless otherwise documented below in the visit note. 

## 2014-05-16 NOTE — Patient Instructions (Signed)

## 2014-05-17 ENCOUNTER — Encounter: Payer: Self-pay | Admitting: Internal Medicine

## 2014-05-17 DIAGNOSIS — E785 Hyperlipidemia, unspecified: Secondary | ICD-10-CM

## 2014-05-17 HISTORY — DX: Hyperlipidemia, unspecified: E78.5

## 2014-05-17 NOTE — Assessment & Plan Note (Signed)
Exam done Vaccines were reviewed Labs ordered She was referred for a colonoscopy

## 2014-05-17 NOTE — Assessment & Plan Note (Signed)
She has achieved her LDL goal 

## 2014-06-18 ENCOUNTER — Encounter: Payer: Self-pay | Admitting: Internal Medicine

## 2014-06-19 ENCOUNTER — Telehealth: Payer: Self-pay | Admitting: *Deleted

## 2014-06-19 NOTE — Telephone Encounter (Signed)
Left msg on triage stating she saw md bck in May never received lab results. Called pt back inform her md sent lab letter she stated she never received. Gave her md response on labs. Also mail copy of letter to pt wanting it mailed to her shop address @ [address] [place]. Mailed letter...Susan Mccarty

## 2014-09-12 ENCOUNTER — Ambulatory Visit: Payer: No Typology Code available for payment source | Admitting: Internal Medicine

## 2014-09-18 ENCOUNTER — Ambulatory Visit: Payer: No Typology Code available for payment source | Admitting: Internal Medicine

## 2014-11-06 ENCOUNTER — Encounter: Payer: Self-pay | Admitting: Internal Medicine

## 2014-11-06 ENCOUNTER — Other Ambulatory Visit (INDEPENDENT_AMBULATORY_CARE_PROVIDER_SITE_OTHER): Payer: No Typology Code available for payment source

## 2014-11-06 ENCOUNTER — Ambulatory Visit (INDEPENDENT_AMBULATORY_CARE_PROVIDER_SITE_OTHER): Payer: No Typology Code available for payment source | Admitting: Internal Medicine

## 2014-11-06 VITALS — BP 130/74 | HR 57 | Temp 98.6°F | Ht 68.85 in | Wt 141.0 lb

## 2014-11-06 DIAGNOSIS — Z0000 Encounter for general adult medical examination without abnormal findings: Secondary | ICD-10-CM

## 2014-11-06 DIAGNOSIS — E785 Hyperlipidemia, unspecified: Secondary | ICD-10-CM

## 2014-11-06 LAB — COMPREHENSIVE METABOLIC PANEL
ALT: 14 U/L (ref 0–35)
AST: 26 U/L (ref 0–37)
Albumin: 4.2 g/dL (ref 3.5–5.2)
Alkaline Phosphatase: 63 U/L (ref 39–117)
BILIRUBIN TOTAL: 0.8 mg/dL (ref 0.2–1.2)
BUN: 17 mg/dL (ref 6–23)
CALCIUM: 9.4 mg/dL (ref 8.4–10.5)
CHLORIDE: 109 meq/L (ref 96–112)
CO2: 24 meq/L (ref 19–32)
CREATININE: 0.8 mg/dL (ref 0.4–1.2)
GFR: 97.32 mL/min (ref >60.00)
GLUCOSE: 86 mg/dL (ref 70–99)
Potassium: 4.6 mEq/L (ref 3.5–5.1)
Sodium: 142 mEq/L (ref 135–145)
Total Protein: 7.5 g/dL (ref 6.0–8.3)

## 2014-11-06 LAB — LIPID PANEL
Cholesterol: 189 mg/dL (ref 0–200)
HDL: 62.9 mg/dL (ref >39.00)
LDL Cholesterol: 118 mg/dL — ABNORMAL HIGH (ref 0–99)
NONHDL: 126.1
TRIGLYCERIDES: 40 mg/dL (ref 0.0–149.0)
Total CHOL/HDL Ratio: 3
VLDL: 8 mg/dL (ref 0.0–40.0)

## 2014-11-06 LAB — TSH: TSH: 1.3 u[IU]/mL (ref 0.35–4.50)

## 2014-11-06 NOTE — Progress Notes (Signed)
Pre visit review using our clinic review tool, if applicable. No additional management support is needed unless otherwise documented below in the visit note. 

## 2014-11-06 NOTE — Patient Instructions (Signed)

## 2014-11-06 NOTE — Progress Notes (Signed)
   Subjective:    Patient ID: Susan Mccarty, female    DOB: March 08, 1951, 63 y.o.   MRN: [id number]  Hyperlipidemia This is a chronic problem. The problem is controlled. Recent lipid tests were reviewed and are variable. She has no history of chronic renal disease, diabetes, hypothyroidism, liver disease, obesity or nephrotic syndrome. Pertinent negatives include no chest pain, focal sensory loss, focal weakness, leg pain, myalgias or shortness of breath. She is currently on no antihyperlipidemic treatment. The current treatment provides mild improvement of lipids.      Review of Systems  Constitutional: Negative.  Negative for fever, chills, diaphoresis, appetite change and fatigue.  HENT: Negative.   Eyes: Negative.   Respiratory: Negative.  Negative for cough, choking, chest tightness and shortness of breath.   Cardiovascular: Negative.  Negative for chest pain, palpitations and leg swelling.  Gastrointestinal: Negative.  Negative for nausea, vomiting, abdominal pain, diarrhea, constipation and blood in stool.  Endocrine: Negative.   Genitourinary: Negative.   Musculoskeletal: Negative.  Negative for myalgias.  Skin: Negative.   Allergic/Immunologic: Negative.   Neurological: Negative.  Negative for focal weakness.  Hematological: Negative.  Negative for adenopathy. Does not bruise/bleed easily.  Psychiatric/Behavioral: Negative.        Objective:   Physical Exam  Constitutional: She is oriented to person, place, and time. She appears well-developed and well-nourished. No distress.  HENT:  Head: Normocephalic and atraumatic.  Mouth/Throat: Oropharynx is clear and moist. No oropharyngeal exudate.  Eyes: Conjunctivae are normal. Right eye exhibits no discharge. Left eye exhibits no discharge. No scleral icterus.  Neck: Normal range of motion. Neck supple. No JVD present. No tracheal deviation present. No thyromegaly present.  Cardiovascular: Normal rate, regular rhythm, normal heart  sounds and intact distal pulses.  Exam reveals no gallop and no friction rub.   No murmur heard. Pulmonary/Chest: Effort normal and breath sounds normal. No stridor. No respiratory distress. She has no wheezes. She has no rales. She exhibits no tenderness.  Abdominal: Soft. Bowel sounds are normal. She exhibits no distension and no mass. There is no tenderness. There is no rebound and no guarding.  Musculoskeletal: Normal range of motion. She exhibits no edema or tenderness.  Lymphadenopathy:    She has no cervical adenopathy.  Neurological: She is oriented to person, place, and time.  Skin: Skin is warm and dry. No rash noted. She is not diaphoretic. No erythema. No pallor.  Vitals reviewed.    Lab Results  Component Value Date   WBC 2.9* 05/16/2014   HGB 12.4 05/16/2014   HCT 37.6 05/16/2014   PLT 205.0 05/16/2014   GLUCOSE 90 05/16/2014   CHOL 182 05/16/2014   TRIG 37.0 05/16/2014   HDL 62.50 05/16/2014   LDLCALC 112* 05/16/2014   ALT 13 05/16/2014   AST 19 05/16/2014   NA 139 05/16/2014   K 4.5 05/16/2014   CL 104 05/16/2014   CREATININE 0.6 05/16/2014   BUN 17 05/16/2014   CO2 30 05/16/2014   TSH 0.86 05/16/2014       Assessment & Plan:

## 2014-11-07 ENCOUNTER — Encounter: Payer: Self-pay | Admitting: Internal Medicine

## 2014-11-07 NOTE — Assessment & Plan Note (Signed)
She is doing well with her lifestyle modifications Her lipids are well controlled

## 2015-05-23 ENCOUNTER — Telehealth: Payer: Self-pay | Admitting: Internal Medicine

## 2015-05-23 NOTE — Telephone Encounter (Signed)
Error

## 2015-05-27 ENCOUNTER — Other Ambulatory Visit: Payer: Self-pay | Admitting: Internal Medicine

## 2015-05-27 ENCOUNTER — Telehealth: Payer: Self-pay | Admitting: Internal Medicine

## 2015-05-27 DIAGNOSIS — Z0000 Encounter for general adult medical examination without abnormal findings: Secondary | ICD-10-CM | POA: Insufficient documentation

## 2015-05-27 DIAGNOSIS — Z1231 Encounter for screening mammogram for malignant neoplasm of breast: Secondary | ICD-10-CM | POA: Insufficient documentation

## 2015-05-27 DIAGNOSIS — Z1211 Encounter for screening for malignant neoplasm of colon: Secondary | ICD-10-CM

## 2015-05-27 HISTORY — DX: Encounter for screening mammogram for malignant neoplasm of breast: Z12.31

## 2015-05-27 NOTE — Telephone Encounter (Signed)
Sent to [facility]. Once they schedule appt with the patient we will complete [facility] referral.

## 2015-05-27 NOTE — Telephone Encounter (Signed)
done

## 2015-05-27 NOTE — Telephone Encounter (Signed)
Pt request referral for colonoscopy with Dr. Hilarie Fredrickson, [facility] aware of [facility] compass. Pt never see this doctor before and this is for preventative.

## 2015-06-06 ENCOUNTER — Telehealth: Payer: Self-pay | Admitting: Internal Medicine

## 2015-06-06 NOTE — Telephone Encounter (Signed)
Patient states that GI can not get her in for a colonoscopy until August.  Patient is requesting to do colorguard.

## 2015-06-13 NOTE — Telephone Encounter (Signed)
Patient is calling back regarding note below

## 2015-06-16 NOTE — Telephone Encounter (Signed)
Form completed Will get it faxed this week

## 2015-06-17 NOTE — Telephone Encounter (Signed)
Called pt and unable to lvm advising her that the form has been completed and faxed form.

## 2015-07-02 LAB — HM MAMMOGRAPHY: HM MAMMO: NORMAL

## 2015-07-11 ENCOUNTER — Encounter: Payer: Self-pay | Admitting: Internal Medicine

## 2015-07-15 NOTE — Addendum Note (Signed)
Addended by: Janith Lima on: 07/15/2015 08:03 AM   Modules accepted: Miquel Dunn

## 2015-08-06 ENCOUNTER — Encounter: Payer: Self-pay | Admitting: Internal Medicine

## 2015-09-30 ENCOUNTER — Ambulatory Visit (AMBULATORY_SURGERY_CENTER): Payer: Self-pay

## 2015-09-30 VITALS — Ht 68.5 in | Wt 143.0 lb

## 2015-09-30 DIAGNOSIS — Z1211 Encounter for screening for malignant neoplasm of colon: Secondary | ICD-10-CM

## 2015-09-30 MED ORDER — NA SULFATE-K SULFATE-MG SULF 17.5-3.13-1.6 GM/177ML PO SOLN: 1.0000 | Freq: Once | ORAL | Status: DC

## 2015-09-30 NOTE — Progress Notes (Signed)
No egg or soy allergies Not on home 02 No previous anesthesia complications No diet or weight loss meds 

## 2015-10-14 ENCOUNTER — Encounter: Payer: Self-pay | Admitting: Internal Medicine

## 2015-10-14 ENCOUNTER — Ambulatory Visit (AMBULATORY_SURGERY_CENTER): Payer: 59 | Admitting: Internal Medicine

## 2015-10-14 VITALS — BP 127/74 | HR 52 | Temp 97.0°F | Resp 16 | Ht 68.5 in | Wt 143.0 lb

## 2015-10-14 DIAGNOSIS — D123 Benign neoplasm of transverse colon: Secondary | ICD-10-CM

## 2015-10-14 DIAGNOSIS — D125 Benign neoplasm of sigmoid colon: Secondary | ICD-10-CM | POA: Diagnosis not present

## 2015-10-14 DIAGNOSIS — Z1211 Encounter for screening for malignant neoplasm of colon: Secondary | ICD-10-CM

## 2015-10-14 DIAGNOSIS — K635 Polyp of colon: Secondary | ICD-10-CM

## 2015-10-14 MED ORDER — SODIUM CHLORIDE 0.9 % IV SOLN
500.0000 mL | INTRAVENOUS | Status: DC
Start: 2015-10-14 — End: 2015-10-14

## 2015-10-14 NOTE — Op Note (Signed)
[facility]  [address] [place], [address]   COLONOSCOPY PROCEDURE REPORT  PATIENT: Susan Mccarty, Susan Mccarty  MR#: [id number] BIRTHDATE: [birth year] , 10  yrs. old GENDER: female ENDOSCOPIST: Jerene Bears, MD REFERRED CN:[id number] Evalina Field, M.D. PROCEDURE DATE:  10/14/2015 PROCEDURE:   Colonoscopy, screening and Colonoscopy with snare polypectomy First Screening Colonoscopy - Avg.  risk and is 50 yrs.  old or older Yes.  Prior Negative Screening - Now for repeat screening. N/A  History of Adenoma - Now for follow-up colonoscopy & has been > or = to 3 yrs.  N/A  Polyps removed today? Yes ASA CLASS:   Class II INDICATIONS:Screening for colonic neoplasia, Colorectal Neoplasm Risk Assessment for this procedure is average risk, and 1st colonoscopy. MEDICATIONS: Monitored anesthesia care and Propofol 200 mg IV  DESCRIPTION OF PROCEDURE:   After the risks benefits and alternatives of the procedure were thoroughly explained, informed consent was obtained.  The digital rectal exam revealed no rectal mass.   The LB [id number]  endoscope was introduced through the anus and advanced to the cecum, which was identified by both the appendix and ileocecal valve. No adverse events experienced. The quality of the prep was good.  (Suprep was used)  The instrument was then slowly withdrawn as the colon was fully examined. Estimated blood loss is zero unless otherwise noted in this procedure report.   COLON FINDINGS: Three sessile polyps ranging between 3-64mm in size were found in the transverse colon.  Polypectomies were performed with a cold snare.  The resection was complete, the polyp tissue was completely retrieved and sent to histology.   A pedunculated polyp measuring 7 mm in size was found in the sigmoid colon.  A polypectomy was performed using snare cautery.  The resection was complete, the polyp tissue was completely retrieved and sent to histology.   There was mild  diverticulosis noted in the sigmoid colon.  Retroflexed views revealed internal hemorrhoids. The time to cecum = 4.9 Withdrawal time = 15.6   The scope was withdrawn and the procedure completed.   COMPLICATIONS: There were no immediate complications.   ENDOSCOPIC IMPRESSION: 1.   Three sessile polyps ranging between 3-47mm in size were found in the transverse colon; polypectomies were performed with a cold snare 2.   Pedunculated polyp was found in the sigmoid colon; polypectomy was performed using snare cautery 3.   Mild diverticulosis was noted in the sigmoid colon  RECOMMENDATIONS: 1.  Avoid all NSAIDs for the next 2 weeks. 2.  High fiber diet 3.  If the polyps removed today are proven to be adenomatous (pre-cancerous) polyps, you will need a colonoscopy in 3 years. Otherwise you should continue to follow colorectal cancer screening guidelines for "routine risk" patients with a colonoscopy in 10 years.  You will receive a letter within 1-2 weeks with the results of your biopsy as well as final recommendations.  Please call my office if you have not received a letter after 3 weeks.  eSigned:  Jerene Bears, MD 10/14/2015 2:34 PM   cc: Janith Lima, MD and The Patient   PATIENT NAME:  Susan Mccarty, Susan Mccarty MR#: [id number]

## 2015-10-14 NOTE — Progress Notes (Signed)
Called to room to assist during endoscopic procedure.  Patient ID and intended procedure confirmed with present staff. Received instructions for my participation in the procedure from the performing physician.  

## 2015-10-14 NOTE — Patient Instructions (Signed)
YOU HAD AN ENDOSCOPIC PROCEDURE TODAY AT [facility]:   Refer to the procedure report that was given to you for any specific questions about what was found during the examination.  If the procedure report does not answer your questions, please call your gastroenterologist to clarify.  If you requested that your care partner not be given the details of your procedure findings, then the procedure report has been included in a sealed envelope for you to review at your convenience later.  YOU SHOULD EXPECT: Some feelings of bloating in the abdomen. Passage of more gas than usual.  Walking can help get rid of the air that was put into your GI tract during the procedure and reduce the bloating. If you had a lower endoscopy (such as a colonoscopy or flexible sigmoidoscopy) you may notice spotting of blood in your stool or on the toilet paper. If you underwent a bowel prep for your procedure, you may not have a normal bowel movement for a few days.  Please Note:  You might notice some irritation and congestion in your nose or some drainage.  This is from the oxygen used during your procedure.  There is no need for concern and it should clear up in a day or so.  SYMPTOMS TO REPORT IMMEDIATELY:   Following lower endoscopy (colonoscopy or flexible sigmoidoscopy):  Excessive amounts of blood in the stool  Significant tenderness or worsening of abdominal pains  Swelling of the abdomen that is new, acute  Fever of 100F or higher  For urgent or emergent issues, a gastroenterologist can be reached at any hour by calling [phone].   DIET: Your first meal following the procedure should be a small meal and then it is ok to progress to your normal diet. Heavy or fried foods are harder to digest and may make you feel nauseous or bloated.  Likewise, meals heavy in dairy and vegetables can increase bloating.  Drink plenty of fluids but you should avoid alcoholic beverages for 24  hours.  ACTIVITY:  You should plan to take it easy for the rest of today and you should NOT DRIVE or use heavy machinery until tomorrow (because of the sedation medicines used during the test).    FOLLOW UP: Our staff will call the number listed on your records the next business day following your procedure to check on you and address any questions or concerns that you may have regarding the information given to you following your procedure. If we do not reach you, we will leave a message.  However, if you are feeling well and you are not experiencing any problems, there is no need to return our call.  We will assume that you have returned to your regular daily activities without incident.  If any biopsies were taken you will be contacted by phone or by letter within the next 1-3 weeks.  Please call us at [phone] if you have not heard about the biopsies in 3 weeks.    SIGNATURES/CONFIDENTIALITY: You and/or your care partner have signed paperwork which will be entered into your electronic medical record.  These signatures attest to the fact that that the information above on your After Visit Summary has been reviewed and is understood.  Full responsibility of the confidentiality of this discharge information lies with you and/or your care-partner.  Please review polyp, diverticulosis, and high fiber diet handouts provided. Avoid all NSAID'S (aspirin, aspirin products, and anti-inflammatory drugs) for two weeks.

## 2015-10-14 NOTE — Progress Notes (Signed)
  [facility] Post-op Note  Patient: Susan Mccarty  Procedure(s) Performed: colonoscopy  Patient Location: [facility] - Recovery Area  Anesthesia Type: Deep Sedation/Propofol  Level of Consciousness: awake, oriented and patient cooperative  Airway and Oxygen Therapy: Patient Spontanous Breathing  Post-op Pain: none  Post-op Assessment:  Post-op Vital signs reviewed, Patient's Cardiovascular Status Stable, Respiratory Function Stable, Patent Airway, No signs of Nausea or vomiting and Pain level controlled  Post-op Vital Signs: Reviewed and stable  Complications: No apparent anesthesia complications  Venba Zenner E 2:36 PM

## 2015-10-15 ENCOUNTER — Telehealth: Payer: Self-pay | Admitting: *Deleted

## 2015-10-15 NOTE — Telephone Encounter (Signed)
  Follow up Call-  Call back number 10/14/2015  Post procedure Call Back phone  # [phone] hm  Permission to leave phone message Yes     Patient questions:  Do you have a fever, pain , or abdominal swelling? No. Pain Score  0 *  Have you tolerated food without any problems? Yes.    Have you been able to return to your normal activities?yes  Do you have any questions about your discharge instructions: Diet   No. Medications  No. Follow up visit  No.  Do you have questions or concerns about your Care? No.  Actions: * If pain score is 4 or above: No action needed, pain <4.

## 2015-10-23 ENCOUNTER — Encounter: Payer: Self-pay | Admitting: Internal Medicine

## 2015-10-23 LAB — HM COLONOSCOPY

## 2015-10-23 NOTE — Addendum Note (Signed)
Addended by: Janith Lima on: 10/23/2015 12:49 PM   Modules accepted: Miquel Dunn

## 2016-05-26 ENCOUNTER — Ambulatory Visit: Payer: Self-pay | Admitting: Internal Medicine

## 2016-06-02 ENCOUNTER — Encounter: Payer: Self-pay | Admitting: Internal Medicine

## 2016-06-02 ENCOUNTER — Ambulatory Visit (INDEPENDENT_AMBULATORY_CARE_PROVIDER_SITE_OTHER): Payer: BLUE CROSS/BLUE SHIELD | Admitting: Internal Medicine

## 2016-06-02 ENCOUNTER — Other Ambulatory Visit (INDEPENDENT_AMBULATORY_CARE_PROVIDER_SITE_OTHER): Payer: BLUE CROSS/BLUE SHIELD

## 2016-06-02 VITALS — BP 170/96 | Ht 68.5 in | Wt 143.0 lb

## 2016-06-02 DIAGNOSIS — R001 Bradycardia, unspecified: Secondary | ICD-10-CM

## 2016-06-02 DIAGNOSIS — R002 Palpitations: Secondary | ICD-10-CM

## 2016-06-02 DIAGNOSIS — I10 Essential (primary) hypertension: Secondary | ICD-10-CM | POA: Diagnosis not present

## 2016-06-02 HISTORY — DX: Bradycardia, unspecified: R00.1

## 2016-06-02 HISTORY — DX: Essential (primary) hypertension: I10

## 2016-06-02 LAB — COMPREHENSIVE METABOLIC PANEL
ALT: 8 U/L (ref 0–35)
AST: 18 U/L (ref 0–37)
Albumin: 4.2 g/dL (ref 3.5–5.2)
Alkaline Phosphatase: 58 U/L (ref 39–117)
BILIRUBIN TOTAL: 0.8 mg/dL (ref 0.2–1.2)
BUN: 19 mg/dL (ref 6–23)
CO2: 31 meq/L (ref 19–32)
Calcium: 9.5 mg/dL (ref 8.4–10.5)
Chloride: 103 mEq/L (ref 96–112)
Creatinine, Ser: 0.65 mg/dL (ref 0.40–1.20)
GFR: 117.74 mL/min (ref >60.00)
GLUCOSE: 81 mg/dL (ref 70–99)
POTASSIUM: 4.1 meq/L (ref 3.5–5.1)
SODIUM: 139 meq/L (ref 135–145)
TOTAL PROTEIN: 7.6 g/dL (ref 6.0–8.3)

## 2016-06-02 LAB — CBC WITH DIFFERENTIAL/PLATELET
Basophils Absolute: 0 10*3/uL (ref 0.0–0.1)
Basophils Relative: 0.7 % (ref 0.0–3.0)
EOS PCT: 2.2 % (ref 0.0–5.0)
Eosinophils Absolute: 0.1 10*3/uL (ref 0.0–0.7)
HCT: 38.2 % (ref 36.0–46.0)
Hemoglobin: 12.4 g/dL (ref 12.0–15.0)
LYMPHS ABS: 1.1 10*3/uL (ref 0.7–4.0)
Lymphocytes Relative: 34.7 % (ref 12.0–46.0)
MCHC: 32.5 g/dL (ref 30.0–36.0)
MCV: 82.3 fl (ref 78.0–100.0)
MONO ABS: 0.5 10*3/uL (ref 0.1–1.0)
MONOS PCT: 15.8 % — AB (ref 3.0–12.0)
NEUTROS ABS: 1.5 10*3/uL (ref 1.4–7.7)
NEUTROS PCT: 46.6 % (ref 43.0–77.0)
Platelets: 202 10*3/uL (ref 150.0–400.0)
RBC: 4.65 Mil/uL (ref 3.87–5.11)
RDW: 13.4 % (ref 11.5–15.5)
WBC: 3.2 10*3/uL — ABNORMAL LOW (ref 4.0–10.5)

## 2016-06-02 LAB — URINALYSIS, ROUTINE W REFLEX MICROSCOPIC
Bilirubin Urine: NEGATIVE
KETONES UR: NEGATIVE
LEUKOCYTES UA: NEGATIVE
Nitrite: NEGATIVE
PH: 6.5 (ref 5.0–8.0)
SPECIFIC GRAVITY, URINE: 1.02 (ref 1.000–1.030)
Total Protein, Urine: NEGATIVE
Urine Glucose: NEGATIVE
Urobilinogen, UA: 1 (ref 0.0–1.0)
WBC, UA: NONE SEEN (ref 0–?)

## 2016-06-02 LAB — CORTISOL: CORTISOL PLASMA: 9.8 ug/dL

## 2016-06-02 LAB — THYROID PANEL WITH TSH
FREE THYROXINE INDEX: 2.3 (ref 1.4–3.8)
T3 Uptake: 30 % (ref 22–35)
T4, Total: 7.7 ug/dL (ref 4.5–12.0)
TSH: 0.93 m[IU]/L

## 2016-06-02 MED ORDER — AZILSARTAN-CHLORTHALIDONE 40-25 MG PO TABS: 1.0000 | ORAL_TABLET | Freq: Every day | ORAL | Status: DC

## 2016-06-02 NOTE — Progress Notes (Signed)
Subjective:  Patient ID: Susan Mccarty, female    DOB: 1951-07-27  Age: 65 y.o. MRN: [id number]  CC: Hypertension   HPI Susan Mccarty presents for concerns about an elevated blood pressure. She complains that her blood pressure at home has been elevated. She takes decongestants for allergies. She denies headache, blurred vision, chest pain, shortness of breath, palpitations, syncope, edema, abdominal pain, dysuria, or hematuria.  Outpatient Prescriptions Prior to Visit  Medication Sig Dispense Refill  . brimonidine (ALPHAGAN) 0.2 % ophthalmic solution 1 drop 3 (three) times daily.    . Cholecalciferol (VITAMIN D-3) 1000 UNITS CAPS Take 1,000 Units by mouth daily.    . dorzolamide-timolol (COSOPT) 22.3-6.8 MG/ML ophthalmic solution 1 drop 2 (two) times daily.    . Iron-Vitamins (GERITOL) LIQD Take by mouth daily.    Marland Kitchen latanoprost (XALATAN) 0.005 % ophthalmic solution 1 drop at bedtime.    . vitamin A 8000 UNIT capsule Take 8,000 Units by mouth daily.    . vitamin C (ASCORBIC ACID) 500 MG tablet Take 500 mg by mouth daily.    . vitamin E 400 UNIT capsule Take 400 Units by mouth daily.    . pseudoephedrine-acetaminophen (TYLENOL SINUS) 30-500 MG TABS Take 1 tablet by mouth every 4 (four) hours as needed.     No facility-administered medications prior to visit.    ROS Review of Systems  Constitutional: Negative.  Negative for fever, chills, activity change, appetite change, fatigue and unexpected weight change.  HENT: Negative.   Eyes: Negative.  Negative for visual disturbance.  Respiratory: Negative.  Negative for cough, choking, chest tightness, shortness of breath and stridor.   Cardiovascular: Negative.  Negative for chest pain, palpitations and leg swelling.  Gastrointestinal: Negative.  Negative for nausea, vomiting, abdominal pain, diarrhea, constipation and blood in stool.  Endocrine: Negative.   Genitourinary: Negative.  Negative for difficulty urinating.  Musculoskeletal:  Negative.  Negative for myalgias, back pain, joint swelling, arthralgias and neck pain.  Skin: Negative.  Negative for color change and rash.  Allergic/Immunologic: Negative.   Neurological: Negative.  Negative for dizziness, tremors, seizures, facial asymmetry, weakness, light-headedness and headaches.  Hematological: Negative.  Negative for adenopathy. Does not bruise/bleed easily.  Psychiatric/Behavioral: Negative.     Objective:  BP 170/96 mmHg  Ht 5' 8.5" (1.74 m)  Wt 143 lb (64.864 kg)  BMI 21.42 kg/m2  BP Readings from Last 3 Encounters:  06/02/16 170/96  10/14/15 127/74  11/06/14 130/74    Wt Readings from Last 3 Encounters:  06/02/16 143 lb (64.864 kg)  10/14/15 143 lb (64.864 kg)  09/30/15 143 lb (64.864 kg)    Physical Exam  Constitutional: She is oriented to person, place, and time. No distress.  HENT:  Mouth/Throat: Oropharynx is clear and moist. No oropharyngeal exudate.  Eyes: Conjunctivae are normal. Right eye exhibits no discharge. Left eye exhibits no discharge. No scleral icterus.  Neck: Normal range of motion. Neck supple. No JVD present. No tracheal deviation present. No thyromegaly present.  Cardiovascular: Regular rhythm, S1 normal, S2 normal, normal heart sounds and intact distal pulses.  Bradycardia present.  Exam reveals no gallop and no friction rub.   No murmur heard. EKG -  Marked sinus  Bradycardia  BORDERLINE RHYTHM  Pulmonary/Chest: Effort normal and breath sounds normal. No stridor. No respiratory distress. She has no wheezes. She has no rales. She exhibits no tenderness.  Abdominal: Soft. Bowel sounds are normal. She exhibits no distension and no mass. There is no tenderness.  There is no rebound and no guarding.  Musculoskeletal: Normal range of motion. She exhibits no edema or tenderness.  Lymphadenopathy:    She has no cervical adenopathy.  Neurological: She is oriented to person, place, and time.  Skin: Skin is warm and dry. No rash  noted. She is not diaphoretic. No erythema. No pallor.  Vitals reviewed.   Lab Results  Component Value Date   WBC 3.2* 06/02/2016   HGB 12.4 06/02/2016   HCT 38.2 06/02/2016   PLT 202.0 06/02/2016   GLUCOSE 81 06/02/2016   CHOL 189 11/06/2014   TRIG 40.0 11/06/2014   HDL 62.90 11/06/2014   LDLCALC 118* 11/06/2014   ALT 8 06/02/2016   AST 18 06/02/2016   NA 139 06/02/2016   K 4.1 06/02/2016   CL 103 06/02/2016   CREATININE 0.65 06/02/2016   BUN 19 06/02/2016   CO2 31 06/02/2016   TSH 1.30 11/06/2014    No results found.  Assessment & Plan:   Azelie was seen today for hypertension.  Diagnoses and all orders for this visit:  Palpitations- this diagnosis was entered by the CMA, the patient told me she does not experience palpitations -     EKG 12-Lead  Essential hypertension- she has new onset hypertension and bradycardia, her labs are negative for any secondary metabolic causes for hypertension nor any evidence of end organ damage. There is no LVH on EKG. Will avoid beta blockers in light of the bradycardia and will control her blood pressure with a combination of an ARB and thiazide diuretic. -     Comprehensive metabolic panel; Future -     CBC with Differential/Platelet; Future -     Urinalysis, Routine w reflex microscopic (not at [facility]); Future -     Azilsartan-Chlorthalidone 40-25 MG TABS; Take 1 tablet by mouth daily.  Bradycardia, sinus- her labs are negative for any metabolic causes for low heart rate, I will order a 48-hour Holter monitor to see how severe the bradycardia is, will treat the hypertension but avoid beta blockers. -     Comprehensive metabolic panel; Future -     Thyroid Panel With TSH; Future -     Cortisol; Future   I have discontinued Ms. Barberi's pseudoephedrine-acetaminophen. I am also having her start on Azilsartan-Chlorthalidone. Additionally, I am having her maintain her dorzolamide-timolol, vitamin E, vitamin A, Vitamin D-3, vitamin C,  Geritol, latanoprost, and brimonidine.  Meds ordered this encounter  Medications  . Azilsartan-Chlorthalidone 40-25 MG TABS    Sig: Take 1 tablet by mouth daily.    Dispense:  35 tablet    Refill:  0     Follow-up: Return in about 4 weeks (around 06/30/2016).  Scarlette Calico, MD

## 2016-06-02 NOTE — Patient Instructions (Signed)
Hypertension Hypertension, commonly called high blood pressure, is when the force of blood pumping through your arteries is too strong. Your arteries are the blood vessels that carry blood from your heart throughout your body. A blood pressure reading consists of a higher number over a lower number, such as 110/72. The higher number (systolic) is the pressure inside your arteries when your heart pumps. The lower number (diastolic) is the pressure inside your arteries when your heart relaxes. Ideally you want your blood pressure below 120/80. Hypertension forces your heart to work harder to pump blood. Your arteries may become narrow or stiff. Having untreated or uncontrolled hypertension can cause heart attack, stroke, kidney disease, and other problems. RISK FACTORS Some risk factors for high blood pressure are controllable. Others are not.  Risk factors you cannot control include:   Race. You may be at higher risk if you are African American.  Age. Risk increases with age.  Gender. Men are at higher risk than women before age 45 years. After age 65, women are at higher risk than men. Risk factors you can control include:  Not getting enough exercise or physical activity.  Being overweight.  Getting too much fat, sugar, calories, or salt in your diet.  Drinking too much alcohol. SIGNS AND SYMPTOMS Hypertension does not usually cause signs or symptoms. Extremely high blood pressure (hypertensive crisis) may cause headache, anxiety, shortness of breath, and nosebleed. DIAGNOSIS To check if you have hypertension, your health care provider will measure your blood pressure while you are seated, with your arm held at the level of your heart. It should be measured at least twice using the same arm. Certain conditions can cause a difference in blood pressure between your right and left arms. A blood pressure reading that is higher than normal on one occasion does not mean that you need treatment. If  it is not clear whether you have high blood pressure, you may be asked to return on a different day to have your blood pressure checked again. Or, you may be asked to monitor your blood pressure at home for 1 or more weeks. TREATMENT Treating high blood pressure includes making lifestyle changes and possibly taking medicine. Living a healthy lifestyle can help lower high blood pressure. You may need to change some of your habits. Lifestyle changes may include:  Following the DASH diet. This diet is high in fruits, vegetables, and whole grains. It is low in salt, red meat, and added sugars.  Keep your sodium intake below 2,300 mg per day.  Getting at least 30-45 minutes of aerobic exercise at least 4 times per week.  Losing weight if necessary.  Not smoking.  Limiting alcoholic beverages.  Learning ways to reduce stress. Your health care provider may prescribe medicine if lifestyle changes are not enough to get your blood pressure under control, and if one of the following is true:  You are 18-59 years of age and your systolic blood pressure is above 140.  You are 60 years of age or older, and your systolic blood pressure is above 150.  Your diastolic blood pressure is above 90.  You have diabetes, and your systolic blood pressure is over 140 or your diastolic blood pressure is over 90.  You have kidney disease and your blood pressure is above 140/90.  You have heart disease and your blood pressure is above 140/90. Your personal target blood pressure may vary depending on your medical conditions, your age, and other factors. HOME CARE INSTRUCTIONS    Have your blood pressure rechecked as directed by your health care provider.   Take medicines only as directed by your health care provider. Follow the directions carefully. Blood pressure medicines must be taken as prescribed. The medicine does not work as well when you skip doses. Skipping doses also puts you at risk for  problems.  Do not smoke.   Monitor your blood pressure at home as directed by your health care provider. SEEK MEDICAL CARE IF:   You think you are having a reaction to medicines taken.  You have recurrent headaches or feel dizzy.  You have swelling in your ankles.  You have trouble with your vision. SEEK IMMEDIATE MEDICAL CARE IF:  You develop a severe headache or confusion.  You have unusual weakness, numbness, or feel faint.  You have severe chest or abdominal pain.  You vomit repeatedly.  You have trouble breathing. MAKE SURE YOU:   Understand these instructions.  Will watch your condition.  Will get help right away if you are not doing well or get worse.   This information is not intended to replace advice given to you by your health care provider. Make sure you discuss any questions you have with your health care provider.   Document Released: 12/07/2005 Document Revised: 04/23/2015 Document Reviewed: 09/29/2013 Elsevier Interactive Patient Education 2016 [employer]  

## 2016-06-02 NOTE — Progress Notes (Signed)
Pre visit review using our clinic review tool, if applicable. No additional management support is needed unless otherwise documented below in the visit note. 

## 2016-06-03 ENCOUNTER — Encounter: Payer: Self-pay | Admitting: Internal Medicine

## 2016-06-19 ENCOUNTER — Telehealth: Payer: Self-pay | Admitting: Internal Medicine

## 2016-06-19 NOTE — Telephone Encounter (Signed)
Pt stated Dr. Ronnald Ramp gave her med for BP, she stop taking his med due to dizziness. No dizziness since she stop. Please call pt back and make sure she understand how to take her BP and read it.

## 2016-06-22 NOTE — Telephone Encounter (Signed)
Pt is coming back for nurse visit 77/17.

## 2016-06-22 NOTE — Telephone Encounter (Signed)
Try to call pt both #, can not leave vm, will try again later.

## 2016-06-22 NOTE — Telephone Encounter (Signed)
Pt needs an appt and to bring her bp cuff.

## 2016-06-24 ENCOUNTER — Ambulatory Visit: Payer: Self-pay | Admitting: Gynecology

## 2016-06-24 NOTE — Telephone Encounter (Signed)
Noted  

## 2016-06-26 ENCOUNTER — Ambulatory Visit: Payer: BLUE CROSS/BLUE SHIELD

## 2016-06-26 ENCOUNTER — Telehealth: Payer: Self-pay

## 2016-06-26 VITALS — BP 148/78

## 2016-06-26 DIAGNOSIS — I10 Essential (primary) hypertension: Secondary | ICD-10-CM

## 2016-06-26 NOTE — Telephone Encounter (Signed)
Patient came in for bp check using her home bp device vs. Manual cuff here in office---patient also stated that she is very dizzy with her bp med she takes (edarbyclor)---i took manual reading with results being 148/78, but patient was not currently dizzy so i'm not sure if this med is actually dropping bp too low causing dizziness (which is possible side effect, esp for azilsartan)---patient has made appt for follow up on 7/18 with dr Ronnald Ramp, and i have advised patient to ask pharmacist if she can half pill  using a pill cutter to reduce dosage of this med until she sees dr Ronnald Ramp on 7/18---if med is not ok to be halved---patient should call us back to see if something else can be ordered until dr Ronnald Ramp sees patient for follow up visit on 7/18---patient states if she doesn't take pill at all, she feels lightheaded, like her bp reading is too high----routing to dr Ronnald Ramp also---please advise if you want to change med before patient sees you on 7/18 and i will call patient back, thanks

## 2016-06-26 NOTE — Telephone Encounter (Signed)
Go ahead and cut the tablet in half and see if that helps

## 2016-06-29 NOTE — Telephone Encounter (Signed)
Advised patient that dr Ronnald Ramp said it is ok to half the pill, patient had checked with pharmacist to get ok as well, she is going to continue with taking half a pill until she sees dr Ronnald Ramp on 7/18, advised patient to call office if this doesn't work and we can change med again until patient is seen on 7/18

## 2016-07-02 ENCOUNTER — Telehealth: Payer: Self-pay

## 2016-07-02 NOTE — Telephone Encounter (Signed)
Cologuard order has expired. Pt had colonoscopy completed in October of 2016

## 2016-07-07 ENCOUNTER — Ambulatory Visit (INDEPENDENT_AMBULATORY_CARE_PROVIDER_SITE_OTHER): Payer: Self-pay | Admitting: Internal Medicine

## 2016-07-07 ENCOUNTER — Encounter: Payer: Self-pay | Admitting: Internal Medicine

## 2016-07-07 VITALS — BP 104/70 | HR 53 | Temp 98.3°F | Resp 16 | Ht 68.5 in | Wt 137.5 lb

## 2016-07-07 DIAGNOSIS — I10 Essential (primary) hypertension: Secondary | ICD-10-CM

## 2016-07-07 DIAGNOSIS — E785 Hyperlipidemia, unspecified: Secondary | ICD-10-CM

## 2016-07-07 MED ORDER — CHLORTHALIDONE 25 MG PO TABS: 25.0000 mg | ORAL_TABLET | Freq: Every day | ORAL | Status: DC

## 2016-07-07 NOTE — Progress Notes (Signed)
Pre visit review using our clinic review tool, if applicable. No additional management support is needed unless otherwise documented below in the visit note. 

## 2016-07-07 NOTE — Progress Notes (Signed)
Subjective:  Patient ID: Susan Mccarty, female    DOB: Sep 26, 1951  Age: 65 y.o. MRN: [id number]  CC: Hypertension   HPI BRIANNIE LAGOW presents for a BP check. The current combination of an ARB plus a thiazide diuretic has been too powerful for her. She complains that her blood pressure has been too low and she has had a few episodes of lightheadedness and dizziness.  Outpatient Prescriptions Prior to Visit  Medication Sig Dispense Refill  . brimonidine (ALPHAGAN) 0.2 % ophthalmic solution 1 drop 3 (three) times daily.    . Cholecalciferol (VITAMIN D-3) 1000 UNITS CAPS Take 1,000 Units by mouth daily.    . dorzolamide-timolol (COSOPT) 22.3-6.8 MG/ML ophthalmic solution 1 drop 2 (two) times daily.    . Iron-Vitamins (GERITOL) LIQD Take by mouth daily.    Marland Kitchen latanoprost (XALATAN) 0.005 % ophthalmic solution 1 drop at bedtime.    . vitamin A 8000 UNIT capsule Take 8,000 Units by mouth daily.    . vitamin C (ASCORBIC ACID) 500 MG tablet Take 500 mg by mouth daily.    . vitamin E 400 UNIT capsule Take 400 Units by mouth daily.    . Azilsartan-Chlorthalidone 40-25 MG TABS Take 1 tablet by mouth daily. 35 tablet 0   No facility-administered medications prior to visit.    ROS Review of Systems  Constitutional: Negative.  Negative for fever, appetite change, fatigue and unexpected weight change.  HENT: Negative.   Eyes: Negative.  Negative for visual disturbance.  Respiratory: Negative.  Negative for cough, choking, chest tightness, shortness of breath and stridor.   Cardiovascular: Negative.  Negative for chest pain, palpitations and leg swelling.  Gastrointestinal: Negative.  Negative for nausea, vomiting, abdominal pain and diarrhea.  Endocrine: Negative.   Genitourinary: Negative.  Negative for dysuria, urgency, frequency and hematuria.  Musculoskeletal: Negative.  Negative for myalgias, back pain, joint swelling and arthralgias.  Skin: Negative.  Negative for color change and rash.    Allergic/Immunologic: Negative.   Neurological: Positive for dizziness and light-headedness. Negative for facial asymmetry, weakness and numbness.  Hematological: Negative.  Negative for adenopathy.  Psychiatric/Behavioral: Negative.     Objective:  BP 104/70 mmHg  Pulse 53  Temp(Src) 98.3 F (36.8 C) (Oral)  Resp 16  Ht 5' 8.5" (1.74 m)  Wt 137 lb 8 oz (62.37 kg)  BMI 20.60 kg/m2  SpO2 98%  BP Readings from Last 3 Encounters:  07/07/16 104/70  06/26/16 148/78  06/02/16 170/96    Wt Readings from Last 3 Encounters:  07/07/16 137 lb 8 oz (62.37 kg)  06/02/16 143 lb (64.864 kg)  10/14/15 143 lb (64.864 kg)    Physical Exam  Constitutional: She is oriented to person, place, and time. No distress.  HENT:  Mouth/Throat: Oropharynx is clear and moist. No oropharyngeal exudate.  Eyes: Conjunctivae are normal. Right eye exhibits no discharge. Left eye exhibits no discharge. No scleral icterus.  Neck: Normal range of motion. Neck supple. No JVD present. No tracheal deviation present. No thyromegaly present.  Cardiovascular: Normal rate, regular rhythm, normal heart sounds and intact distal pulses.  Exam reveals no gallop and no friction rub.   No murmur heard. Pulmonary/Chest: Effort normal and breath sounds normal. No stridor. No respiratory distress. She has no wheezes. She has no rales. She exhibits no tenderness.  Abdominal: Soft. Bowel sounds are normal. She exhibits no distension and no mass. There is no tenderness. There is no rebound and no guarding.  Musculoskeletal: Normal range of  motion. She exhibits no edema or tenderness.  Lymphadenopathy:    She has no cervical adenopathy.  Neurological: She is oriented to person, place, and time.  Skin: Skin is warm and dry. No rash noted. She is not diaphoretic. No erythema. No pallor.  Vitals reviewed.   Lab Results  Component Value Date   WBC 3.2* 06/02/2016   HGB 12.4 06/02/2016   HCT 38.2 06/02/2016   PLT 202.0  06/02/2016   GLUCOSE 81 06/02/2016   CHOL 189 11/06/2014   TRIG 40.0 11/06/2014   HDL 62.90 11/06/2014   LDLCALC 118* 11/06/2014   ALT 8 06/02/2016   AST 18 06/02/2016   NA 139 06/02/2016   K 4.1 06/02/2016   CL 103 06/02/2016   CREATININE 0.65 06/02/2016   BUN 19 06/02/2016   CO2 31 06/02/2016   TSH 0.93 06/02/2016    No results found.  Assessment & Plan:   Susan Mccarty was seen today for hypertension.  Diagnoses and all orders for this visit:  Essential hypertension- Her blood pressure is over controlled and she is symptomatic, I have asked her to stop taking the ARB but will continue the thiazide diuretic. -     chlorthalidone (HYGROTON) 25 MG tablet; Take 1 tablet (25 mg total) by mouth daily.  Hyperlipidemia with target LDL less than 130   I have discontinued Ms. Coltrane's Azilsartan-Chlorthalidone. I am also having her start on chlorthalidone. Additionally, I am having her maintain her dorzolamide-timolol, vitamin E, vitamin A, Vitamin D-3, vitamin C, Geritol, latanoprost, and brimonidine.  Meds ordered this encounter  Medications  . chlorthalidone (HYGROTON) 25 MG tablet    Sig: Take 1 tablet (25 mg total) by mouth daily.    Dispense:  90 tablet    Refill:  1     Follow-up: Return in about 4 months (around 11/07/2016).  Scarlette Calico, MD

## 2016-07-07 NOTE — Patient Instructions (Signed)
Hypertension Hypertension, commonly called high blood pressure, is when the force of blood pumping through your arteries is too strong. Your arteries are the blood vessels that carry blood from your heart throughout your body. A blood pressure reading consists of a higher number over a lower number, such as 110/72. The higher number (systolic) is the pressure inside your arteries when your heart pumps. The lower number (diastolic) is the pressure inside your arteries when your heart relaxes. Ideally you want your blood pressure below 120/80. Hypertension forces your heart to work harder to pump blood. Your arteries may become narrow or stiff. Having untreated or uncontrolled hypertension can cause heart attack, stroke, kidney disease, and other problems. RISK FACTORS Some risk factors for high blood pressure are controllable. Others are not.  Risk factors you cannot control include:   Race. You may be at higher risk if you are African American.  Age. Risk increases with age.  Gender. Men are at higher risk than women before age 45 years. After age 65, women are at higher risk than men. Risk factors you can control include:  Not getting enough exercise or physical activity.  Being overweight.  Getting too much fat, sugar, calories, or salt in your diet.  Drinking too much alcohol. SIGNS AND SYMPTOMS Hypertension does not usually cause signs or symptoms. Extremely high blood pressure (hypertensive crisis) may cause headache, anxiety, shortness of breath, and nosebleed. DIAGNOSIS To check if you have hypertension, your health care provider will measure your blood pressure while you are seated, with your arm held at the level of your heart. It should be measured at least twice using the same arm. Certain conditions can cause a difference in blood pressure between your right and left arms. A blood pressure reading that is higher than normal on one occasion does not mean that you need treatment. If  it is not clear whether you have high blood pressure, you may be asked to return on a different day to have your blood pressure checked again. Or, you may be asked to monitor your blood pressure at home for 1 or more weeks. TREATMENT Treating high blood pressure includes making lifestyle changes and possibly taking medicine. Living a healthy lifestyle can help lower high blood pressure. You may need to change some of your habits. Lifestyle changes may include:  Following the DASH diet. This diet is high in fruits, vegetables, and whole grains. It is low in salt, red meat, and added sugars.  Keep your sodium intake below 2,300 mg per day.  Getting at least 30-45 minutes of aerobic exercise at least 4 times per week.  Losing weight if necessary.  Not smoking.  Limiting alcoholic beverages.  Learning ways to reduce stress. Your health care provider may prescribe medicine if lifestyle changes are not enough to get your blood pressure under control, and if one of the following is true:  You are 18-59 years of age and your systolic blood pressure is above 140.  You are 60 years of age or older, and your systolic blood pressure is above 150.  Your diastolic blood pressure is above 90.  You have diabetes, and your systolic blood pressure is over 140 or your diastolic blood pressure is over 90.  You have kidney disease and your blood pressure is above 140/90.  You have heart disease and your blood pressure is above 140/90. Your personal target blood pressure may vary depending on your medical conditions, your age, and other factors. HOME CARE INSTRUCTIONS    Have your blood pressure rechecked as directed by your health care provider.   Take medicines only as directed by your health care provider. Follow the directions carefully. Blood pressure medicines must be taken as prescribed. The medicine does not work as well when you skip doses. Skipping doses also puts you at risk for  problems.  Do not smoke.   Monitor your blood pressure at home as directed by your health care provider. SEEK MEDICAL CARE IF:   You think you are having a reaction to medicines taken.  You have recurrent headaches or feel dizzy.  You have swelling in your ankles.  You have trouble with your vision. SEEK IMMEDIATE MEDICAL CARE IF:  You develop a severe headache or confusion.  You have unusual weakness, numbness, or feel faint.  You have severe chest or abdominal pain.  You vomit repeatedly.  You have trouble breathing. MAKE SURE YOU:   Understand these instructions.  Will watch your condition.  Will get help right away if you are not doing well or get worse.   This information is not intended to replace advice given to you by your health care provider. Make sure you discuss any questions you have with your health care provider.   Document Released: 12/07/2005 Document Revised: 04/23/2015 Document Reviewed: 09/29/2013 Elsevier Interactive Patient Education 2016 [employer]  

## 2016-08-05 ENCOUNTER — Telehealth: Payer: Self-pay | Admitting: Internal Medicine

## 2016-08-05 NOTE — Telephone Encounter (Signed)
Pt called concern about her weight, she said she is not gaining any weight since last ov, instead she is losing. Pt also stating she never got the lab result that was done back in June 2017. Please call her back

## 2016-08-06 NOTE — Telephone Encounter (Signed)
Tried to call pt back. No voicemail picked up to leave a message.

## 2016-08-10 NOTE — Telephone Encounter (Signed)
Patient called again about this. And just ask for the nurse to give her a call once she had time. Thank you.

## 2016-08-10 NOTE — Telephone Encounter (Signed)
Triaged pt for any signs of distress. Pt states that she has been eating and she feels fine, no additional stress, CP, SOB or anything else that she could think of.  Offered pt an appt to be seen.  Pt stated that she is going to keep a food and wt diary.   Pt will call back for an appt if she continues to loss wt.

## 2016-09-17 ENCOUNTER — Encounter: Payer: Self-pay | Admitting: Internal Medicine

## 2016-09-17 ENCOUNTER — Ambulatory Visit (INDEPENDENT_AMBULATORY_CARE_PROVIDER_SITE_OTHER): Payer: PPO | Admitting: Internal Medicine

## 2016-09-17 VITALS — BP 158/88 | HR 58 | Temp 98.7°F | Resp 16 | Ht 68.5 in | Wt 135.2 lb

## 2016-09-17 DIAGNOSIS — Z1231 Encounter for screening mammogram for malignant neoplasm of breast: Secondary | ICD-10-CM

## 2016-09-17 DIAGNOSIS — I10 Essential (primary) hypertension: Secondary | ICD-10-CM

## 2016-09-17 DIAGNOSIS — D259 Leiomyoma of uterus, unspecified: Secondary | ICD-10-CM | POA: Diagnosis not present

## 2016-09-17 DIAGNOSIS — R634 Abnormal weight loss: Secondary | ICD-10-CM | POA: Diagnosis not present

## 2016-09-17 HISTORY — DX: Abnormal weight loss: R63.4

## 2016-09-17 HISTORY — DX: Leiomyoma of uterus, unspecified: D25.9

## 2016-09-17 MED ORDER — DRONABINOL 2.5 MG PO CAPS: 2.5000 mg | ORAL_CAPSULE | Freq: Two times a day (BID) | ORAL | 1 refills | Status: DC

## 2016-09-17 NOTE — Progress Notes (Signed)
Subjective:  Patient ID: Susan Mccarty, female    DOB: [birth year]  Age: 65 y.o. MRN: [id number]  CC: Weight Loss   HPI Susan Mccarty presents for Concerns about her weight. For the last 2 years she has felt that she is to then and now returns today and complains that she has lost another 2 pounds over the last 2-3 months. She feels well and thinks she has a good appetite.  She has a history of uterine fibroids and has had a couple of episodes of vaginal spotting lately. Her previous gynecologist has retired and she request to see a new gynecologist.  Outpatient Medications Prior to Visit  Medication Sig Dispense Refill  . brimonidine (ALPHAGAN) 0.2 % ophthalmic solution 1 drop 3 (three) times daily.    . chlorthalidone (HYGROTON) 25 MG tablet Take 1 tablet (25 mg total) by mouth daily. 90 tablet 1  . Cholecalciferol (VITAMIN D-3) 1000 UNITS CAPS Take 1,000 Units by mouth daily.    . dorzolamide-timolol (COSOPT) 22.3-6.8 MG/ML ophthalmic solution 1 drop 2 (two) times daily.    . Iron-Vitamins (GERITOL) LIQD Take by mouth daily.    Marland Kitchen latanoprost (XALATAN) 0.005 % ophthalmic solution 1 drop at bedtime.    . vitamin A 8000 UNIT capsule Take 8,000 Units by mouth daily.    . vitamin C (ASCORBIC ACID) 500 MG tablet Take 500 mg by mouth daily.    . vitamin E 400 UNIT capsule Take 400 Units by mouth daily.     No facility-administered medications prior to visit.     ROS Review of Systems  Constitutional: Positive for unexpected weight change. Negative for activity change, appetite change, chills, fatigue and fever.  HENT: Negative.   Eyes: Negative.   Respiratory: Negative.  Negative for cough, choking, chest tightness, shortness of breath and stridor.   Cardiovascular: Negative.  Negative for chest pain, palpitations and leg swelling.  Gastrointestinal: Negative.  Negative for abdominal pain, constipation, diarrhea, nausea and vomiting.  Endocrine: Negative.   Genitourinary: Positive for  vaginal bleeding. Negative for pelvic pain, urgency, vaginal discharge and vaginal pain.  Musculoskeletal: Negative.  Negative for arthralgias, back pain, joint swelling and myalgias.  Skin: Negative.  Negative for color change and rash.  Allergic/Immunologic: Negative.   Neurological: Negative.   Hematological: Negative.  Negative for adenopathy. Does not bruise/bleed easily.  Psychiatric/Behavioral: Negative.  Negative for decreased concentration, dysphoric mood, sleep disturbance and suicidal ideas. The patient is not nervous/anxious.     Objective:  BP (!) 158/88 (BP Location: Left Arm, Patient Position: Sitting, Cuff Size: Normal)   Pulse (!) 58   Temp 98.7 F (37.1 C) (Oral)   Resp 16   Ht 5' 8.5" (1.74 m)   Wt 135 lb 4 oz (61.3 kg)   SpO2 98%   BMI 20.27 kg/m   BP Readings from Last 3 Encounters:  09/17/16 (!) 158/88  07/07/16 104/70  06/26/16 (!) 148/78    Wt Readings from Last 3 Encounters:  09/17/16 135 lb 4 oz (61.3 kg)  07/07/16 137 lb 8 oz (62.4 kg)  06/02/16 143 lb (64.9 kg)    Physical Exam  Constitutional: She is oriented to person, place, and time. No distress.  HENT:  Mouth/Throat: Oropharynx is clear and moist. No oropharyngeal exudate.  Eyes: Conjunctivae are normal. Right eye exhibits no discharge. Left eye exhibits no discharge. No scleral icterus.  Neck: Normal range of motion. Neck supple. No JVD present. No tracheal deviation present. No thyromegaly present.  Cardiovascular: Normal rate, regular rhythm, normal heart sounds and intact distal pulses.  Exam reveals no gallop and no friction rub.   No murmur heard. Pulmonary/Chest: Effort normal and breath sounds normal. No stridor. No respiratory distress. She has no wheezes. She has no rales. She exhibits no tenderness.  Abdominal: Soft. Bowel sounds are normal. She exhibits no distension and no mass. There is no tenderness. There is no rebound and no guarding.  Musculoskeletal: Normal range of  motion. She exhibits no edema, tenderness or deformity.  Lymphadenopathy:    She has no cervical adenopathy.  Neurological: She is oriented to person, place, and time.  Skin: Skin is warm and dry. No rash noted. She is not diaphoretic. No erythema. No pallor.  Vitals reviewed.   Lab Results  Component Value Date   WBC 3.2 (L) 06/02/2016   HGB 12.4 06/02/2016   HCT 38.2 06/02/2016   PLT 202.0 06/02/2016   GLUCOSE 81 06/02/2016   CHOL 189 11/06/2014   TRIG 40.0 11/06/2014   HDL 62.90 11/06/2014   LDLCALC 118 (H) 11/06/2014   ALT 8 06/02/2016   AST 18 06/02/2016   NA 139 06/02/2016   K 4.1 06/02/2016   CL 103 06/02/2016   CREATININE 0.65 06/02/2016   BUN 19 06/02/2016   CO2 31 06/02/2016   TSH 0.93 06/02/2016    No results found.  Assessment & Plan:   Susan Mccarty was seen today for weight loss.  Diagnoses and all orders for this visit:  Uterine leiomyoma, unspecified location- I've asked her to see gynecology for further evaluation of this. -     Ambulatory referral to Gynecology  Visit for screening mammogram -     MM DIGITAL SCREENING BILATERAL; Future  Weight loss, non-intentional- we'll try Marinol to see if this supports her appetite and helps her gain weight. At this time I do not see any secondary metabolic, infectious, organic, or malignant causes for her to be losing weight. I will recheck her thyroid function, screen for hep C and HIV, and will check an albumin level as well. -     dronabinol (MARINOL) 2.5 MG capsule; Take 1 capsule (2.5 mg total) by mouth 2 (two) times daily before lunch and supper. -     Thyroid Panel With TSH; Future -     Hepatitis C antibody; Future -     HIV antibody; Future -     Comprehensive metabolic panel; Future  Essential hypertension- her blood pressure is adequately well controlled, I will monitor her lites and renal function. -     Comprehensive metabolic panel; Future   I am having Susan Mccarty start on dronabinol. I am also  having her maintain her dorzolamide-timolol, vitamin E, vitamin A, Vitamin D-3, vitamin C, Geritol, latanoprost, brimonidine, and chlorthalidone.  Meds ordered this encounter  Medications  . dronabinol (MARINOL) 2.5 MG capsule    Sig: Take 1 capsule (2.5 mg total) by mouth 2 (two) times daily before lunch and supper.    Dispense:  60 capsule    Refill:  1     Follow-up: Return in about 2 months (around 11/17/2016).  Scarlette Calico, MD

## 2016-09-17 NOTE — Patient Instructions (Signed)
Hypertension Hypertension, commonly called high blood pressure, is when the force of blood pumping through your arteries is too strong. Your arteries are the blood vessels that carry blood from your heart throughout your body. A blood pressure reading consists of a higher number over a lower number, such as 110/72. The higher number (systolic) is the pressure inside your arteries when your heart pumps. The lower number (diastolic) is the pressure inside your arteries when your heart relaxes. Ideally you want your blood pressure below 120/80. Hypertension forces your heart to work harder to pump blood. Your arteries may become narrow or stiff. Having untreated or uncontrolled hypertension can cause heart attack, stroke, kidney disease, and other problems. RISK FACTORS Some risk factors for high blood pressure are controllable. Others are not.  Risk factors you cannot control include:   Race. You may be at higher risk if you are African American.  Age. Risk increases with age.  Gender. Men are at higher risk than women before age 45 years. After age 65, women are at higher risk than men. Risk factors you can control include:  Not getting enough exercise or physical activity.  Being overweight.  Getting too much fat, sugar, calories, or salt in your diet.  Drinking too much alcohol. SIGNS AND SYMPTOMS Hypertension does not usually cause signs or symptoms. Extremely high blood pressure (hypertensive crisis) may cause headache, anxiety, shortness of breath, and nosebleed. DIAGNOSIS To check if you have hypertension, your health care provider will measure your blood pressure while you are seated, with your arm held at the level of your heart. It should be measured at least twice using the same arm. Certain conditions can cause a difference in blood pressure between your right and left arms. A blood pressure reading that is higher than normal on one occasion does not mean that you need treatment. If  it is not clear whether you have high blood pressure, you may be asked to return on a different day to have your blood pressure checked again. Or, you may be asked to monitor your blood pressure at home for 1 or more weeks. TREATMENT Treating high blood pressure includes making lifestyle changes and possibly taking medicine. Living a healthy lifestyle can help lower high blood pressure. You may need to change some of your habits. Lifestyle changes may include:  Following the DASH diet. This diet is high in fruits, vegetables, and whole grains. It is low in salt, red meat, and added sugars.  Keep your sodium intake below 2,300 mg per day.  Getting at least 30-45 minutes of aerobic exercise at least 4 times per week.  Losing weight if necessary.  Not smoking.  Limiting alcoholic beverages.  Learning ways to reduce stress. Your health care provider may prescribe medicine if lifestyle changes are not enough to get your blood pressure under control, and if one of the following is true:  You are 18-59 years of age and your systolic blood pressure is above 140.  You are 60 years of age or older, and your systolic blood pressure is above 150.  Your diastolic blood pressure is above 90.  You have diabetes, and your systolic blood pressure is over 140 or your diastolic blood pressure is over 90.  You have kidney disease and your blood pressure is above 140/90.  You have heart disease and your blood pressure is above 140/90. Your personal target blood pressure may vary depending on your medical conditions, your age, and other factors. HOME CARE INSTRUCTIONS    Have your blood pressure rechecked as directed by your health care provider.   Take medicines only as directed by your health care provider. Follow the directions carefully. Blood pressure medicines must be taken as prescribed. The medicine does not work as well when you skip doses. Skipping doses also puts you at risk for  problems.  Do not smoke.   Monitor your blood pressure at home as directed by your health care provider. SEEK MEDICAL CARE IF:   You think you are having a reaction to medicines taken.  You have recurrent headaches or feel dizzy.  You have swelling in your ankles.  You have trouble with your vision. SEEK IMMEDIATE MEDICAL CARE IF:  You develop a severe headache or confusion.  You have unusual weakness, numbness, or feel faint.  You have severe chest or abdominal pain.  You vomit repeatedly.  You have trouble breathing. MAKE SURE YOU:   Understand these instructions.  Will watch your condition.  Will get help right away if you are not doing well or get worse.   This information is not intended to replace advice given to you by your health care provider. Make sure you discuss any questions you have with your health care provider.   Document Released: 12/07/2005 Document Revised: 04/23/2015 Document Reviewed: 09/29/2013 Elsevier Interactive Patient Education 2016 [employer]  

## 2016-09-17 NOTE — Progress Notes (Signed)
Pre visit review using our clinic review tool, if applicable. No additional management support is needed unless otherwise documented below in the visit note. 

## 2016-09-22 ENCOUNTER — Telehealth: Payer: Self-pay | Admitting: Emergency Medicine

## 2016-09-22 NOTE — Telephone Encounter (Signed)
error 

## 2016-09-25 ENCOUNTER — Telehealth: Payer: Self-pay

## 2016-09-25 NOTE — Telephone Encounter (Signed)
PCP does not have results for labs that were ordered on 09/19/2016. Pt did not go to the lab  Called pt home number and no answer or vm.   Called pt work number and informed pt that labs were still needing to be done. She states understanding and will try to make before they close today. If not, she will be in next week.

## 2016-09-28 ENCOUNTER — Other Ambulatory Visit (INDEPENDENT_AMBULATORY_CARE_PROVIDER_SITE_OTHER): Payer: PPO

## 2016-09-28 DIAGNOSIS — I10 Essential (primary) hypertension: Secondary | ICD-10-CM | POA: Diagnosis not present

## 2016-09-28 DIAGNOSIS — R634 Abnormal weight loss: Secondary | ICD-10-CM

## 2016-09-28 LAB — COMPREHENSIVE METABOLIC PANEL
ALBUMIN: 3.9 g/dL (ref 3.5–5.2)
ALK PHOS: 56 U/L (ref 39–117)
ALT: 8 U/L (ref 0–35)
AST: 19 U/L (ref 0–37)
BILIRUBIN TOTAL: 0.8 mg/dL (ref 0.2–1.2)
BUN: 25 mg/dL — AB (ref 6–23)
CO2: 31 mEq/L (ref 19–32)
CREATININE: 0.8 mg/dL (ref 0.40–1.20)
Calcium: 9.6 mg/dL (ref 8.4–10.5)
Chloride: 102 mEq/L (ref 96–112)
GFR: 92.56 mL/min (ref >60.00)
Glucose, Bld: 80 mg/dL (ref 70–99)
POTASSIUM: 3.2 meq/L — AB (ref 3.5–5.1)
SODIUM: 140 meq/L (ref 135–145)
TOTAL PROTEIN: 7.4 g/dL (ref 6.0–8.3)

## 2016-09-29 ENCOUNTER — Encounter: Payer: Self-pay | Admitting: Gynecology

## 2016-09-29 ENCOUNTER — Encounter: Payer: Self-pay | Admitting: Internal Medicine

## 2016-09-29 ENCOUNTER — Ambulatory Visit (INDEPENDENT_AMBULATORY_CARE_PROVIDER_SITE_OTHER): Payer: PPO | Admitting: Gynecology

## 2016-09-29 VITALS — BP 128/82 | Ht 67.0 in | Wt 135.0 lb

## 2016-09-29 DIAGNOSIS — M8580 Other specified disorders of bone density and structure, unspecified site: Secondary | ICD-10-CM | POA: Insufficient documentation

## 2016-09-29 DIAGNOSIS — Z01411 Encounter for gynecological examination (general) (routine) with abnormal findings: Secondary | ICD-10-CM

## 2016-09-29 DIAGNOSIS — D252 Subserosal leiomyoma of uterus: Secondary | ICD-10-CM

## 2016-09-29 DIAGNOSIS — D250 Submucous leiomyoma of uterus: Secondary | ICD-10-CM | POA: Diagnosis not present

## 2016-09-29 DIAGNOSIS — M899 Disorder of bone, unspecified: Secondary | ICD-10-CM | POA: Diagnosis not present

## 2016-09-29 DIAGNOSIS — N950 Postmenopausal bleeding: Secondary | ICD-10-CM | POA: Diagnosis not present

## 2016-09-29 DIAGNOSIS — N930 Postcoital and contact bleeding: Secondary | ICD-10-CM | POA: Insufficient documentation

## 2016-09-29 DIAGNOSIS — R634 Abnormal weight loss: Secondary | ICD-10-CM | POA: Diagnosis not present

## 2016-09-29 DIAGNOSIS — D251 Intramural leiomyoma of uterus: Secondary | ICD-10-CM | POA: Diagnosis not present

## 2016-09-29 DIAGNOSIS — M8588 Other specified disorders of bone density and structure, other site: Secondary | ICD-10-CM | POA: Diagnosis not present

## 2016-09-29 HISTORY — DX: Postcoital and contact bleeding: N93.0

## 2016-09-29 HISTORY — DX: Abnormal weight loss: R63.4

## 2016-09-29 LAB — HEPATITIS C ANTIBODY: HCV Ab: NEGATIVE

## 2016-09-29 LAB — THYROID PANEL WITH TSH
FREE THYROXINE INDEX: 2.9 (ref 1.4–3.8)
T3 Uptake: 32 % (ref 22–35)
T4, Total: 9.1 ug/dL (ref 4.5–12.0)
TSH: 0.77 m[IU]/L

## 2016-09-29 LAB — HIV ANTIBODY (ROUTINE TESTING W REFLEX): HIV 1&2 Ab, 4th Generation: NONREACTIVE

## 2016-09-29 LAB — HM PAP SMEAR

## 2016-09-29 NOTE — Patient Instructions (Signed)

## 2016-09-29 NOTE — Progress Notes (Signed)
Susan Mccarty 1951-09-21 [id number]   History:    65 y.o.  for annual gyn exam who has not been seen in the office since 2011. Her PCP is Dr. Ronnald Ramp who has been doing her blood work. She had been complaining of weight loss and recently he had checked a TSH, blood sugar, HIV, and hepatitis C and all were normal. He had recently put her on some nutritional supplement. Patient states also that on and off after intercourse she has postcoital bleeding she does not bleed in any other time. She states she was having a menstrual cycle up to about 4 years ago. She does have a long-standing history of fibroid uterus when her old records were reviewed but never had any surgical intervention. The last ultrasound that I have is from 2006 which demonstrated the following:  Uterus measured 10.7 x 7.8 x 7.9 cm with a submucous myoma encroaching in the uterine cavity measuring 4.5 x 3.4 x 4.3 cm and also several intramural and subserosal myomas totaling 5 with her largest one measuring 3.2 x 3.8 cm.  Review of her record also indicated 1996 she had a benign right breast biopsy. She also has informed me that this year she had 3 colon polyps removed which were benign and she is on a 5 year recall. Patient denies any prior history of any abnormal Pap smear and I did not see any when I reviewed her past medical record. She also had a benign endometrial biopsy back in 2007 by my partner who retired. Patient has not been on any hormone replacement therapy. She uses lubricant during intercourse.  Past medical history,surgical history, family history and social history were all reviewed and documented in the EPIC chart.  Gynecologic History No LMP recorded. Patient is postmenopausal. Contraception: post menopausal status Last Pap: 2011. Results were: normal Last mammogram: 2016. Results were: Normal but dense  Obstetric History OB History  Gravida Para Term Preterm AB Living  1 0     1 0  SAB TAB Ectopic Multiple  Live Births               # Outcome Date GA Lbr Len/2nd Weight Sex Delivery Anes PTL Lv  1 AB                ROS: A ROS was performed and pertinent positives and negatives are included in the history.  GENERAL: No fevers or chills. HEENT: No change in vision, no earache, sore throat or sinus congestion. NECK: No pain or stiffness. CARDIOVASCULAR: No chest pain or pressure. No palpitations. PULMONARY: No shortness of breath, cough or wheeze. GASTROINTESTINAL: No abdominal pain, nausea, vomiting or diarrhea, melena or bright red blood per rectum. GENITOURINARY: No urinary frequency, urgency, hesitancy or dysuria. MUSCULOSKELETAL: No joint or muscle pain, no back pain, no recent trauma. DERMATOLOGIC: No rash, no itching, no lesions. ENDOCRINE: No polyuria, polydipsia, no heat or cold intolerance. No recent change in weight. HEMATOLOGICAL: No anemia or easy bruising or bleeding. NEUROLOGIC: No headache, seizures, numbness, tingling or weakness. PSYCHIATRIC: No depression, no loss of interest in normal activity or change in sleep pattern.     Exam: chaperone present  BP 128/82   Ht 5\' 7"  (1.702 m)   Wt 135 lb (61.2 kg)   BMI 21.14 kg/m   Body mass index is 21.14 kg/m.  General appearance : Well developed well nourished female. No acute distress HEENT: Eyes: no retinal hemorrhage or exudates,  Neck supple, trachea midline,  no carotid bruits, no thyroidmegaly Lungs: Clear to auscultation, no rhonchi or wheezes, or rib retractions  Heart: Regular rate and rhythm, no murmurs or gallops Breast:Examined in sitting and supine position were symmetrical in appearance, no palpable masses or tenderness,  no skin retraction, no nipple inversion, no nipple discharge, no skin discoloration, no axillary or supraclavicular lymphadenopathy Abdomen: no palpable masses or tenderness, no rebound or guarding Extremities: no edema or skin discoloration or tenderness  Pelvic:  Bartholin, Urethra, Skene Glands:  Within normal limits             Vagina: No gross lesions or discharge  Cervix: No gross lesions or discharge  Uterus  12 week size irregular mobile,   Adnexa  Without masses or tenderness  Anus and perineum  normal   Rectovaginal  normal sphincter tone without palpated masses or tenderness             Hemoccult PCP provides     Assessment/Plan:  65 y.o. female for annual exam with history of fibroid uterus we'll schedule an ultrasound next week to make sure this continued to decrease in size and her menopausal years. Also because of her postcoital bleeding will look at the endometrial thickness and determine at that point about doing an endometrial biopsy. We did do a Pap smear with HPV screening today. We did discuss the new guidelines. Her PCP has been doing her blood work. Patient declined the flu vaccine today. In November she will need a bone density study. I've also given a requisition for this month to schedule her overdue mammogram. We discussed importance of calcium vitamin D and weightbearing exercises for osteoporosis prevention.   Terrance Mass MD, 9:31 AM 09/29/2016

## 2016-09-30 ENCOUNTER — Encounter: Payer: Self-pay | Admitting: Gynecology

## 2016-09-30 ENCOUNTER — Other Ambulatory Visit: Payer: Self-pay | Admitting: *Deleted

## 2016-09-30 DIAGNOSIS — E559 Vitamin D deficiency, unspecified: Secondary | ICD-10-CM

## 2016-09-30 LAB — VITAMIN D 25 HYDROXY (VIT D DEFICIENCY, FRACTURES): Vit D, 25-Hydroxy: 23 ng/mL — ABNORMAL LOW (ref 30–100)

## 2016-09-30 MED ORDER — VITAMIN D (ERGOCALCIFEROL) 1.25 MG (50000 UNIT) PO CAPS: ORAL_CAPSULE | ORAL | 0 refills | Status: DC

## 2016-10-01 LAB — PAP, TP IMAGING W/ HPV RNA, RFLX HPV TYPE 16,18/45: HPV mRNA, High Risk: NOT DETECTED

## 2016-11-17 ENCOUNTER — Ambulatory Visit: Payer: PPO | Admitting: Internal Medicine

## 2016-11-18 ENCOUNTER — Telehealth: Payer: Self-pay | Admitting: Internal Medicine

## 2016-11-18 NOTE — Telephone Encounter (Signed)
Could not reach patient °

## 2016-11-18 NOTE — Telephone Encounter (Signed)
Call her 

## 2016-11-18 NOTE — Telephone Encounter (Signed)
Patient no showed for follow up on 11/28.  Please advise.

## 2016-12-02 ENCOUNTER — Encounter: Payer: Self-pay | Admitting: Internal Medicine

## 2016-12-02 DIAGNOSIS — Z1231 Encounter for screening mammogram for malignant neoplasm of breast: Secondary | ICD-10-CM | POA: Diagnosis not present

## 2016-12-30 ENCOUNTER — Other Ambulatory Visit: Payer: PPO

## 2016-12-30 DIAGNOSIS — E559 Vitamin D deficiency, unspecified: Secondary | ICD-10-CM | POA: Diagnosis not present

## 2016-12-31 LAB — VITAMIN D 25 HYDROXY (VIT D DEFICIENCY, FRACTURES): Vit D, 25-Hydroxy: 39 ng/mL (ref 30–100)

## 2017-01-12 DIAGNOSIS — H25813 Combined forms of age-related cataract, bilateral: Secondary | ICD-10-CM | POA: Diagnosis not present

## 2017-01-12 DIAGNOSIS — H401112 Primary open-angle glaucoma, right eye, moderate stage: Secondary | ICD-10-CM | POA: Diagnosis not present

## 2017-01-12 DIAGNOSIS — H401123 Primary open-angle glaucoma, left eye, severe stage: Secondary | ICD-10-CM | POA: Diagnosis not present

## 2017-03-18 ENCOUNTER — Other Ambulatory Visit: Payer: Self-pay | Admitting: Internal Medicine

## 2017-03-18 DIAGNOSIS — I10 Essential (primary) hypertension: Secondary | ICD-10-CM

## 2017-04-07 ENCOUNTER — Ambulatory Visit: Payer: PPO | Admitting: Internal Medicine

## 2017-04-23 ENCOUNTER — Other Ambulatory Visit: Payer: Self-pay | Admitting: Internal Medicine

## 2017-04-23 DIAGNOSIS — I10 Essential (primary) hypertension: Secondary | ICD-10-CM

## 2017-05-05 ENCOUNTER — Encounter: Payer: Self-pay | Admitting: Gynecology

## 2017-05-11 ENCOUNTER — Encounter: Payer: Self-pay | Admitting: Internal Medicine

## 2017-05-11 ENCOUNTER — Ambulatory Visit (INDEPENDENT_AMBULATORY_CARE_PROVIDER_SITE_OTHER): Payer: PPO | Admitting: Internal Medicine

## 2017-05-11 ENCOUNTER — Other Ambulatory Visit (INDEPENDENT_AMBULATORY_CARE_PROVIDER_SITE_OTHER): Payer: PPO

## 2017-05-11 VITALS — BP 120/60 | HR 58 | Temp 98.5°F | Resp 16 | Ht 67.0 in | Wt 134.0 lb

## 2017-05-11 DIAGNOSIS — E876 Hypokalemia: Secondary | ICD-10-CM

## 2017-05-11 DIAGNOSIS — I10 Essential (primary) hypertension: Secondary | ICD-10-CM

## 2017-05-11 DIAGNOSIS — E785 Hyperlipidemia, unspecified: Secondary | ICD-10-CM

## 2017-05-11 HISTORY — DX: Hypokalemia: E87.6

## 2017-05-11 LAB — MAGNESIUM: Magnesium: 2.2 mg/dL (ref 1.5–2.5)

## 2017-05-11 LAB — BASIC METABOLIC PANEL
BUN: 22 mg/dL (ref 6–23)
CO2: 31 mEq/L (ref 19–32)
Calcium: 9.6 mg/dL (ref 8.4–10.5)
Chloride: 101 mEq/L (ref 96–112)
Creatinine, Ser: 0.85 mg/dL (ref 0.40–1.20)
GFR: 86.14 mL/min (ref >60.00)
Glucose, Bld: 76 mg/dL (ref 70–99)
Potassium: 3.8 mEq/L (ref 3.5–5.1)
Sodium: 139 mEq/L (ref 135–145)

## 2017-05-11 LAB — LIPID PANEL
Cholesterol: 188 mg/dL (ref 0–200)
HDL: 58.2 mg/dL (ref >39.00)
LDL CALC: 119 mg/dL — AB (ref 0–99)
NonHDL: 129.88
TRIGLYCERIDES: 54 mg/dL (ref 0.0–149.0)
Total CHOL/HDL Ratio: 3
VLDL: 10.8 mg/dL (ref 0.0–40.0)

## 2017-05-11 MED ORDER — CHLORTHALIDONE 25 MG PO TABS: 12.5000 mg | ORAL_TABLET | Freq: Every day | ORAL | 1 refills | Status: DC

## 2017-05-11 MED ORDER — CHLORTHALIDONE 25 MG PO TABS
12.5000 mg | ORAL_TABLET | Freq: Every day | ORAL | 1 refills | Status: DC
Start: 2017-05-11 — End: 2017-05-11

## 2017-05-11 NOTE — Patient Instructions (Signed)

## 2017-05-11 NOTE — Progress Notes (Signed)
Subjective:  Patient ID: Susan Mccarty, female    DOB: 1951/11/21  Age: 66 y.o. MRN: [id number]  CC: Hypertension   HPI Susan Mccarty presents for a BP check - Her BP has been well controled, she feels well and offers no complaints.  Outpatient Medications Prior to Visit  Medication Sig Dispense Refill  . brimonidine (ALPHAGAN) 0.2 % ophthalmic solution 1 drop 3 (three) times daily.    . Cholecalciferol (VITAMIN D-3) 1000 UNITS CAPS Take 1,000 Units by mouth daily.    . dorzolamide-timolol (COSOPT) 22.3-6.8 MG/ML ophthalmic solution 1 drop 2 (two) times daily.    . Iron-Vitamins (GERITOL) LIQD Take by mouth daily.    Marland Kitchen latanoprost (XALATAN) 0.005 % ophthalmic solution 1 drop at bedtime.    . vitamin A 8000 UNIT capsule Take 8,000 Units by mouth daily.    . vitamin C (ASCORBIC ACID) 500 MG tablet Take 500 mg by mouth daily.    . Vitamin D, Ergocalciferol, (DRISDOL) 50000 units CAPS capsule Take one tablet by mouth weekly for 12 weeks, after completing 12 weeks call office to have vitamin d level rechecked 12 capsule 0  . vitamin E 400 UNIT capsule Take 400 Units by mouth daily.    . chlorthalidone (HYGROTON) 25 MG tablet Take 1 tablet (25 mg total) by mouth daily. Must keep 05/11/17 appt for future refills 30 tablet 0  . dronabinol (MARINOL) 2.5 MG capsule Take 1 capsule (2.5 mg total) by mouth 2 (two) times daily before lunch and supper. 60 capsule 1   No facility-administered medications prior to visit.     ROS Review of Systems  Constitutional: Negative for activity change, diaphoresis and fatigue.  HENT: Negative.   Eyes: Negative for visual disturbance.  Respiratory: Negative for cough, chest tightness, shortness of breath and wheezing.   Cardiovascular: Negative for chest pain, palpitations and leg swelling.  Gastrointestinal: Negative for abdominal pain, constipation, diarrhea, nausea and vomiting.  Endocrine: Negative.   Genitourinary: Negative.  Negative for difficulty  urinating, dysuria, frequency and urgency.  Musculoskeletal: Negative for back pain and neck pain.  Skin: Negative.   Allergic/Immunologic: Negative.   Neurological: Negative.  Negative for dizziness, weakness and headaches.  Hematological: Negative for adenopathy. Does not bruise/bleed easily.  Psychiatric/Behavioral: Negative.     Objective:  BP 120/60 (BP Location: Left Arm, Patient Position: Sitting, Cuff Size: Normal)   Pulse (!) 58   Temp 98.5 F (36.9 C) (Oral)   Resp 16   Ht 5\' 7"  (1.702 m)   Wt 134 lb (60.8 kg)   SpO2 98%   BMI 20.99 kg/m   BP Readings from Last 3 Encounters:  05/11/17 120/60  09/29/16 128/82  09/17/16 (!) 158/88    Wt Readings from Last 3 Encounters:  05/11/17 134 lb (60.8 kg)  09/29/16 135 lb (61.2 kg)  09/17/16 135 lb 4 oz (61.3 kg)    Physical Exam  Constitutional: She is oriented to person, place, and time. No distress.  HENT:  Mouth/Throat: Oropharynx is clear and moist. No oropharyngeal exudate.  Eyes: Right eye exhibits no discharge. Left eye exhibits no discharge. No scleral icterus.  Neck: Normal range of motion. Neck supple. No thyromegaly present.  Cardiovascular: Normal rate, regular rhythm, normal heart sounds and intact distal pulses.  Exam reveals no gallop and no friction rub.   No murmur heard. Pulmonary/Chest: Effort normal and breath sounds normal. No respiratory distress. She has no wheezes. She has no rales. She exhibits no tenderness.  Abdominal: Soft. Bowel sounds are normal. She exhibits no distension and no mass. There is no tenderness. There is no rebound and no guarding.  Musculoskeletal: Normal range of motion. She exhibits no edema, tenderness or deformity.  Neurological: She is oriented to person, place, and time.  Skin: Skin is warm and dry. No rash noted. She is not diaphoretic. No erythema. No pallor.  Vitals reviewed.   Lab Results  Component Value Date   WBC 3.2 (L) 06/02/2016   HGB 12.4 06/02/2016    HCT 38.2 06/02/2016   PLT 202.0 06/02/2016   GLUCOSE 76 05/11/2017   CHOL 188 05/11/2017   TRIG 54.0 05/11/2017   HDL 58.20 05/11/2017   LDLCALC 119 (H) 05/11/2017   ALT 8 09/28/2016   AST 19 09/28/2016   NA 139 05/11/2017   K 3.8 05/11/2017   CL 101 05/11/2017   CREATININE 0.85 05/11/2017   BUN 22 05/11/2017   CO2 31 05/11/2017   TSH 0.77 09/28/2016    No results found.  Assessment & Plan:   Susan Mccarty was seen today for hypertension.  Diagnoses and all orders for this visit:  Hyperlipidemia with target LDL less than 130- Her Framingham risk was only 4% so I do not recommend that she start taking a statin for cardiovascular risk reduction. -     Lipid panel; Future  Essential hypertension- her blood pressure is adequately well controlled, electrolytes and renal function are normal. -     Basic metabolic panel; Future -     Magnesium; Future -     Discontinue: chlorthalidone (HYGROTON) 25 MG tablet; Take 0.5 tablets (12.5 mg total) by mouth daily. Must keep 05/11/17 appt for future refills -     chlorthalidone (HYGROTON) 25 MG tablet; Take 0.5 tablets (12.5 mg total) by mouth daily.  Hypokalemia- her potassium level is normal now, in light of chronic thiazide diuretic therapy I will continue to monitor her for electrolyte abnormalities. -     Basic metabolic panel; Future -     Magnesium; Future   I have discontinued [name]'s dronabinol and chlorthalidone. I have also changed her chlorthalidone. Additionally, I am having her maintain her dorzolamide-timolol, vitamin E, vitamin A, Vitamin D-3, vitamin C, Geritol, latanoprost, brimonidine, and Vitamin D (Ergocalciferol).  Meds ordered this encounter  Medications  . DISCONTD: chlorthalidone (HYGROTON) 25 MG tablet    Sig: Take 0.5 tablets (12.5 mg total) by mouth daily. Must keep 05/11/17 appt for future refills    Dispense:  45 tablet    Refill:  1  . chlorthalidone (HYGROTON) 25 MG tablet    Sig: Take 0.5 tablets (12.5 mg  total) by mouth daily.    Dispense:  45 tablet    Refill:  1     Follow-up: Return in about 6 months (around 11/11/2017).  Scarlette Calico, MD

## 2017-05-26 ENCOUNTER — Other Ambulatory Visit: Payer: Self-pay | Admitting: Internal Medicine

## 2017-05-26 DIAGNOSIS — I10 Essential (primary) hypertension: Secondary | ICD-10-CM

## 2017-06-07 ENCOUNTER — Telehealth: Payer: Self-pay | Admitting: Internal Medicine

## 2017-06-07 NOTE — Telephone Encounter (Signed)
Pt called checking on the results from her labs that were done on 05/11/2017. She would like a call with these results.

## 2017-06-07 NOTE — Telephone Encounter (Signed)
Lab Results  Component Value Date   WBC 3.2 (L) 06/02/2016   HGB 12.4 06/02/2016   HCT 38.2 06/02/2016   PLT 202.0 06/02/2016   GLUCOSE 76 05/11/2017   CHOL 188 05/11/2017   TRIG 54.0 05/11/2017   HDL 58.20 05/11/2017   LDLCALC 119 (H) 05/11/2017   ALT 8 09/28/2016   AST 19 09/28/2016   NA 139 05/11/2017   K 3.8 05/11/2017   CL 101 05/11/2017   CREATININE 0.85 05/11/2017   BUN 22 05/11/2017   CO2 31 05/11/2017   TSH 0.77 09/28/2016    Her labs were all okay I don't see any concerns

## 2017-06-07 NOTE — Telephone Encounter (Signed)
Tried to call mobile but no number to leave a message.

## 2017-06-16 NOTE — Telephone Encounter (Signed)
Not able to leave a message. 

## 2017-06-17 DIAGNOSIS — H401123 Primary open-angle glaucoma, left eye, severe stage: Secondary | ICD-10-CM | POA: Diagnosis not present

## 2017-06-17 DIAGNOSIS — H25813 Combined forms of age-related cataract, bilateral: Secondary | ICD-10-CM | POA: Diagnosis not present

## 2017-06-17 DIAGNOSIS — H401112 Primary open-angle glaucoma, right eye, moderate stage: Secondary | ICD-10-CM | POA: Diagnosis not present

## 2017-07-25 ENCOUNTER — Encounter (HOSPITAL_COMMUNITY): Payer: Self-pay | Admitting: *Deleted

## 2017-07-25 ENCOUNTER — Ambulatory Visit (HOSPITAL_COMMUNITY)
Admission: EM | Admit: 2017-07-25 | Discharge: 2017-07-25 | Disposition: A | Discharge status: Home / Self Care | Payer: PPO | Attending: Family Medicine | Admitting: Family Medicine

## 2017-07-25 DIAGNOSIS — J029 Acute pharyngitis, unspecified: Secondary | ICD-10-CM | POA: Diagnosis not present

## 2017-07-25 LAB — POCT RAPID STREP A: Streptococcus, Group A Screen (Direct): NEGATIVE

## 2017-07-25 MED ORDER — AZITHROMYCIN 250 MG PO TABS: 250.0000 mg | ORAL_TABLET | Freq: Every day | ORAL | 0 refills | Status: DC

## 2017-07-25 MED ORDER — LIDOCAINE VISCOUS 2 % MT SOLN: 20.0000 mL | OROMUCOSAL | 0 refills | Status: DC | PRN

## 2017-07-25 NOTE — ED Provider Notes (Signed)
  [facility]   [id number] 07/25/17 Arrival Time: 1908  ASSESSMENT & PLAN:  1. Pharyngitis, unspecified etiology     Meds ordered this encounter  Medications  . azithromycin (ZITHROMAX) 250 MG tablet    Sig: Take 1 tablet (250 mg total) by mouth daily. Take first 2 tablets together, then 1 every day until finished.    Dispense:  6 tablet    Refill:  0    Order Specific Question:   Supervising Provider    Answer:   Sherlene Shams [[id number]]  . lidocaine (XYLOCAINE) 2 % solution    Sig: Use as directed 20 mLs in the mouth or throat as needed for mouth pain.    Dispense:  100 mL    Refill:  0    Order Specific Question:   Supervising Provider    Answer:   Sherlene Shams [[id number]]    Reviewed expectations re: course of current medical issues. Questions answered. Outlined signs and symptoms indicating need for more acute intervention. Patient verbalized understanding. After Visit Summary given.   SUBJECTIVE:  Susan Mccarty is a 66 y.o. female who presents with complaint of sore throat for 3 days.  She states it is difficult to swallow.  She Has been taking otc meds which are not helping.  ROS: As per HPI.   OBJECTIVE:  Vitals:   07/25/17 1956  BP: (!) 155/78  Pulse: 72  Resp: 18  Temp: 99.4 F (37.4 C)  TempSrc: Oral  SpO2: 100%     General appearance: alert; no distress HEENT: normocephalic; atraumatic; conjunctivae normal; TMs normal; nasal mucosa normal; opx erythematous and tonsils 2 plus Neck: supple Lungs: clear to auscultation bilaterally Heart: regular rate and rhythm Abdomen: soft, non-tender; bowel sounds normal; no masses or organomegaly; no guarding or rebound tenderness Back: no CVA tenderness Extremities: no cyanosis or edema; symmetrical with no gross deformities Skin: warm and dry Neurologic: normal symmetric reflexes; normal gait Psychological:  alert and cooperative; normal mood and affect    Labs Reviewed  CULTURE, GROUP A STREP  [facility])  POCT RAPID STREP A    No results found.  No Known Allergies  PMHx, SurgHx, SocialHx, Medications, and Allergies were reviewed in the Visit Navigator and updated as appropriate.      Lysbeth Penner, FNP 07/28/17 1300

## 2017-07-25 NOTE — ED Triage Notes (Signed)
Pt  Reports   Several  Days  Of  sorethroat   With  Pain   When  She  Swallows   Symptoms  Not  releived  By  otc  meds      Pt  Sitting   Upright on  The   Exam table   Speaking  In  Complete   sentances

## 2017-07-27 LAB — CULTURE, GROUP A STREP (THRC)

## 2017-08-17 ENCOUNTER — Other Ambulatory Visit (INDEPENDENT_AMBULATORY_CARE_PROVIDER_SITE_OTHER): Payer: PPO

## 2017-08-17 ENCOUNTER — Encounter: Payer: Self-pay | Admitting: Internal Medicine

## 2017-08-17 ENCOUNTER — Telehealth: Payer: Self-pay | Admitting: Internal Medicine

## 2017-08-17 ENCOUNTER — Ambulatory Visit (INDEPENDENT_AMBULATORY_CARE_PROVIDER_SITE_OTHER): Payer: PPO | Admitting: Internal Medicine

## 2017-08-17 VITALS — BP 142/80 | HR 44 | Temp 97.8°F | Resp 16 | Ht 67.0 in | Wt 133.0 lb

## 2017-08-17 DIAGNOSIS — D539 Nutritional anemia, unspecified: Secondary | ICD-10-CM | POA: Insufficient documentation

## 2017-08-17 DIAGNOSIS — T502X5A Adverse effect of carbonic-anhydrase inhibitors, benzothiadiazides and other diuretics, initial encounter: Secondary | ICD-10-CM

## 2017-08-17 DIAGNOSIS — R001 Bradycardia, unspecified: Secondary | ICD-10-CM | POA: Diagnosis not present

## 2017-08-17 DIAGNOSIS — I10 Essential (primary) hypertension: Secondary | ICD-10-CM

## 2017-08-17 DIAGNOSIS — E876 Hypokalemia: Secondary | ICD-10-CM

## 2017-08-17 DIAGNOSIS — Z0000 Encounter for general adult medical examination without abnormal findings: Secondary | ICD-10-CM | POA: Diagnosis not present

## 2017-08-17 HISTORY — DX: Bradycardia, unspecified: R00.1

## 2017-08-17 HISTORY — DX: Nutritional anemia, unspecified: D53.9

## 2017-08-17 LAB — CBC WITH DIFFERENTIAL/PLATELET
BASOS PCT: 1 % (ref 0.0–3.0)
Basophils Absolute: 0 10*3/uL (ref 0.0–0.1)
EOS PCT: 3.7 % (ref 0.0–5.0)
Eosinophils Absolute: 0.1 10*3/uL (ref 0.0–0.7)
HEMATOCRIT: 35.3 % — AB (ref 36.0–46.0)
HEMOGLOBIN: 11.7 g/dL — AB (ref 12.0–15.0)
LYMPHS PCT: 28.4 % (ref 12.0–46.0)
Lymphs Abs: 0.9 10*3/uL (ref 0.7–4.0)
MCHC: 33.1 g/dL (ref 30.0–36.0)
MCV: 82.4 fl (ref 78.0–100.0)
Monocytes Absolute: 0.4 10*3/uL (ref 0.1–1.0)
Monocytes Relative: 13.3 % — ABNORMAL HIGH (ref 3.0–12.0)
NEUTROS ABS: 1.7 10*3/uL (ref 1.4–7.7)
Neutrophils Relative %: 53.6 % (ref 43.0–77.0)
PLATELETS: 200 10*3/uL (ref 150.0–400.0)
RBC: 4.28 Mil/uL (ref 3.87–5.11)
RDW: 13.7 % (ref 11.5–15.5)
WBC: 3.3 10*3/uL — AB (ref 4.0–10.5)

## 2017-08-17 LAB — BASIC METABOLIC PANEL
BUN: 21 mg/dL (ref 6–23)
CALCIUM: 9.7 mg/dL (ref 8.4–10.5)
CO2: 32 mEq/L (ref 19–32)
CREATININE: 0.82 mg/dL (ref 0.40–1.20)
Chloride: 101 mEq/L (ref 96–112)
GFR: 89.71 mL/min (ref >60.00)
GLUCOSE: 93 mg/dL (ref 70–99)
Potassium: 3.4 mEq/L — ABNORMAL LOW (ref 3.5–5.1)
Sodium: 139 mEq/L (ref 135–145)

## 2017-08-17 MED ORDER — POTASSIUM CHLORIDE CRYS ER 20 MEQ PO TBCR: 20.0000 meq | EXTENDED_RELEASE_TABLET | Freq: Two times a day (BID) | ORAL | 1 refills | Status: DC

## 2017-08-17 NOTE — Telephone Encounter (Signed)
Pt came back by the front desk asking if she can continue exercising until appt is set up w/ cardiology?  Please call her to let her know.

## 2017-08-17 NOTE — Progress Notes (Signed)
Subjective:  Patient ID: Susan Mccarty, female    DOB: [birth year]  Age: 66 y.o. MRN: [id number]  CC: Annual Exam and Hypertension   HPI Susan Mccarty presents for a Welcome to Medicare Visit and f/up on HTN. She feels well and offers no complaints. She works out of the gentleman has had no recent episodes of dizziness, lightheadedness, near-syncope, palpitations, fatigue, or shortness of breath.  Past Medical History:  Diagnosis Date  . Glaucoma   . Glaucoma   . Vitamin D deficiency    Past Surgical History:  Procedure Laterality Date  . BREAST CYST EXCISION  2006   right     reports that she has never smoked. She has never used smokeless tobacco. She reports that she drinks alcohol. She reports that she does not use drugs. family history includes Alcohol abuse in her brother and mother; Breast cancer in her cousin; Diabetes in her mother; Hypertension in her father. No Known Allergies  Outpatient Medications Prior to Visit  Medication Sig Dispense Refill  . brimonidine (ALPHAGAN) 0.2 % ophthalmic solution 1 drop 3 (three) times daily.    . chlorthalidone (HYGROTON) 25 MG tablet TAKE 1 TABLET BY MOUTH EVERY DAY MUST KEEP APPT 30 tablet 3  . Cholecalciferol (VITAMIN D-3) 1000 UNITS CAPS Take 1,000 Units by mouth daily.    . dorzolamide-timolol (COSOPT) 22.3-6.8 MG/ML ophthalmic solution 1 drop 2 (two) times daily.    . Iron-Vitamins (GERITOL) LIQD Take by mouth daily.    Marland Kitchen latanoprost (XALATAN) 0.005 % ophthalmic solution 1 drop at bedtime.    . vitamin A 8000 UNIT capsule Take 8,000 Units by mouth daily.    . vitamin C (ASCORBIC ACID) 500 MG tablet Take 500 mg by mouth daily.    . Vitamin D, Ergocalciferol, (DRISDOL) 50000 units CAPS capsule Take one tablet by mouth weekly for 12 weeks, after completing 12 weeks call office to have vitamin d level rechecked 12 capsule 0  . vitamin E 400 UNIT capsule Take 400 Units by mouth daily.    Marland Kitchen azithromycin (ZITHROMAX) 250 MG tablet Take  1 tablet (250 mg total) by mouth daily. Take first 2 tablets together, then 1 every day until finished. 6 tablet 0  . lidocaine (XYLOCAINE) 2 % solution Use as directed 20 mLs in the mouth or throat as needed for mouth pain. 100 mL 0   No facility-administered medications prior to visit.     ROS Review of Systems  Constitutional: Negative for activity change, appetite change, diaphoresis, fatigue and unexpected weight change.  HENT: Negative.  Negative for trouble swallowing.   Eyes: Negative for visual disturbance.  Respiratory: Negative.  Negative for cough, chest tightness, shortness of breath and wheezing.   Cardiovascular: Negative for chest pain, palpitations and leg swelling.  Gastrointestinal: Negative.  Negative for abdominal pain, blood in stool, constipation, diarrhea, nausea and vomiting.  Endocrine: Negative.   Genitourinary: Negative.  Negative for difficulty urinating.  Musculoskeletal: Negative.  Negative for back pain and myalgias.  Skin: Negative.   Allergic/Immunologic: Negative.   Neurological: Negative.  Negative for dizziness, syncope, speech difficulty, weakness, light-headedness, numbness and headaches.  Hematological: Negative for adenopathy. Does not bruise/bleed easily.  Psychiatric/Behavioral: Negative.     Objective:  BP (!) 142/80 (BP Location: Left Arm, Patient Position: Sitting, Cuff Size: Normal)   Pulse (!) 44   Temp 97.8 F (36.6 C) (Oral)   Resp 16   Ht 5\' 7"  (1.702 m)   Wt 133 lb (60.3  kg)   SpO2 99%   BMI 20.83 kg/m   BP Readings from Last 3 Encounters:  08/17/17 (!) 142/80  07/25/17 (!) 155/78  05/11/17 120/60    Wt Readings from Last 3 Encounters:  08/17/17 133 lb (60.3 kg)  05/11/17 134 lb (60.8 kg)  09/29/16 135 lb (61.2 kg)    Physical Exam  Constitutional: She is oriented to person, place, and time. No distress.  HENT:  Mouth/Throat: Oropharynx is clear and moist. No oropharyngeal exudate.  Eyes: Conjunctivae are normal.  Right eye exhibits no discharge. Left eye exhibits no discharge. No scleral icterus.  Neck: Normal range of motion. Neck supple. No JVD present. No thyromegaly present.  Cardiovascular: Regular rhythm, S1 normal, S2 normal and normal heart sounds.  Bradycardia present.  Exam reveals no gallop and no friction rub.   No murmur heard. EKG- Marked sinus  Bradycardia  BORDERLINE RHYTHM- her HR is much lower then previously  Pulmonary/Chest: Effort normal and breath sounds normal. No respiratory distress. She has no wheezes. She has no rales. She exhibits no tenderness.  Abdominal: Soft. Bowel sounds are normal. She exhibits no distension and no mass. There is no tenderness. There is no rebound and no guarding.  Musculoskeletal: Normal range of motion. She exhibits no edema, tenderness or deformity.  Lymphadenopathy:    She has no cervical adenopathy.  Neurological: She is alert and oriented to person, place, and time.  Skin: Skin is warm and dry. No rash noted. She is not diaphoretic. No erythema. No pallor.  Psychiatric: She has a normal mood and affect. Her behavior is normal. Judgment and thought content normal.    Visual Acuity Screening   Right eye Left eye Both eyes  Without correction: 20/25 20/25 20/25   With correction:     Hearing Screening Comments: Patient passed whisper test   Lab Results  Component Value Date   WBC 3.3 (L) 08/17/2017   HGB 11.7 (L) 08/17/2017   HCT 35.3 (L) 08/17/2017   PLT 200.0 08/17/2017   GLUCOSE 93 08/17/2017   CHOL 188 05/11/2017   TRIG 54.0 05/11/2017   HDL 58.20 05/11/2017   LDLCALC 119 (H) 05/11/2017   ALT 8 09/28/2016   AST 19 09/28/2016   NA 139 08/17/2017   K 3.4 (L) 08/17/2017   CL 101 08/17/2017   CREATININE 0.82 08/17/2017   BUN 21 08/17/2017   CO2 32 08/17/2017   TSH 1.03 08/17/2017    No results found.  Assessment & Plan:   Susan Mccarty was seen today for annual exam and hypertension.  Diagnoses and all orders for this  visit:  Essential hypertension- her blood pressure is well controlled. She has developed hypokalemia. Renal function is normal. -     CBC with Differential/Platelet; Future -     Basic metabolic panel; Future -     Thyroid Panel With TSH; Future -     Magnesium; Future  Routine health maintenance  Bradycardia- heart rate is down to 40. In the summer of 2017 she had a heart rate of about 58 and I ordered a 48-hour Holter monitor which she never did. Forcefully, she is asymptomatic with respect to this. Have asked her to see cardiology to see if this needs to be evaluated further and were treated. -     EKG 12-Lead -     Ambulatory referral to Cardiology  Deficiency anemia- she has developed a N/N anemia, will screen for vit deficiences -     Vitamin B12; Future -  IBC panel; Future -     Folate; Future -     Ferritin; Future -     Vitamin B1; Future  Diuretic-induced hypokalemia- will start K+ replacement therapy -     Magnesium; Future -     potassium chloride SA (K-DUR,KLOR-CON) 20 MEQ tablet; Take 1 tablet (20 mEq total) by mouth 2 (two) times daily.   I have discontinued Ms. Hooley's azithromycin and lidocaine. I am also having her start on potassium chloride SA. Additionally, I am having her maintain her dorzolamide-timolol, vitamin E, vitamin A, Vitamin D-3, vitamin C, Geritol, latanoprost, brimonidine, Vitamin D (Ergocalciferol), and chlorthalidone.  Meds ordered this encounter  Medications  . potassium chloride SA (K-DUR,KLOR-CON) 20 MEQ tablet    Sig: Take 1 tablet (20 mEq total) by mouth 2 (two) times daily.    Dispense:  180 tablet    Refill:  1   See AVS for instructions about healthy living and anticipatory guidance.   Follow-up: Return in about 3 months (around 11/17/2017).  Scarlette Calico, MD

## 2017-08-17 NOTE — Assessment & Plan Note (Signed)
SHE REFUSED ALL VACCINES TODAY  The written screening recommendations is given to patient and attached in the patent instructions or AVS.   The patient is here for annual Medicare wellness examination and management of other chronic and acute problems.   The risk factors are reflected in the social history.  The roster of all physicians providing medical care to patient - is listed in the Snapshot section of the chart.  Activities of daily living:  The patient is 100% inedpendent in all ADLs: dressing, toileting, feeding as well as independent mobility  Home safety : The patient has smoke detectors in the home. They wear seatbelts.No firearms at home ( firearms are present in the home, kept in a safe fashion). There is no violence in the home.   There is no risks for hepatitis, STDs or HIV. There is no   history of blood transfusion. They have no travel history to infectious disease endemic areas of the world.  The patient has (has not) seen their dentist in the last six month. They have (not) seen their eye doctor in the last year. They deny (admit to) any hearing difficulty and have not had audiologic testing in the last year.  They do not  have excessive sun exposure. Discussed the need for sun protection: hats, long sleeves and use of sunscreen if there is significant sun exposure.   Diet: the importance of a healthy diet is discussed. They do have a healthy (unhealthy-high fat/fast food) diet.  The patient has a regular exercise program.  The benefits of regular aerobic exercise were discussed.  Depression screen: there are no signs or vegative symptoms of depression- irritability, change in appetite, anhedonia, sadness/tearfullness.  Cognitive assessment: the patient manages all their financial and personal affairs and is actively engaged. They could relate day,date,year and events; recalled 3/3 objects at 3 minutes; performed clock-face test normally.  The following portions of the  patient's history were reviewed and updated as appropriate: allergies, current medications, past family history, past medical history,  past surgical history, past social history  and problem list.  Vision, hearing, body mass index were assessed and reviewed.   During the course of the visit the patient was educated and counseled about appropriate screening and preventive services including : fall prevention , diabetes screening, nutrition counseling, colorectal cancer screening, and recommended immunizations.

## 2017-08-17 NOTE — Patient Instructions (Signed)

## 2017-08-17 NOTE — Telephone Encounter (Signed)
She should refrain from exercise until seen by cardiology

## 2017-08-18 LAB — THYROID PANEL WITH TSH
Free Thyroxine Index: 3.2 (ref 1.4–3.8)
T3 Uptake: 31 % (ref 22–35)
T4 TOTAL: 10.3 ug/dL (ref 5.1–11.9)
TSH: 1.03 mIU/L

## 2017-08-18 NOTE — Telephone Encounter (Signed)
Pt informed to refrain from exercise

## 2017-08-19 ENCOUNTER — Other Ambulatory Visit (INDEPENDENT_AMBULATORY_CARE_PROVIDER_SITE_OTHER): Payer: PPO

## 2017-08-19 ENCOUNTER — Other Ambulatory Visit: Payer: Self-pay | Admitting: Internal Medicine

## 2017-08-19 DIAGNOSIS — E876 Hypokalemia: Secondary | ICD-10-CM | POA: Diagnosis not present

## 2017-08-19 DIAGNOSIS — I10 Essential (primary) hypertension: Secondary | ICD-10-CM

## 2017-08-19 DIAGNOSIS — D513 Other dietary vitamin B12 deficiency anemia: Secondary | ICD-10-CM | POA: Insufficient documentation

## 2017-08-19 DIAGNOSIS — D508 Other iron deficiency anemias: Secondary | ICD-10-CM

## 2017-08-19 DIAGNOSIS — T502X5A Adverse effect of carbonic-anhydrase inhibitors, benzothiadiazides and other diuretics, initial encounter: Secondary | ICD-10-CM

## 2017-08-19 DIAGNOSIS — D539 Nutritional anemia, unspecified: Secondary | ICD-10-CM | POA: Diagnosis not present

## 2017-08-19 HISTORY — DX: Other iron deficiency anemias: D50.8

## 2017-08-19 HISTORY — DX: Other dietary vitamin B12 deficiency anemia: D51.3

## 2017-08-19 LAB — MAGNESIUM: Magnesium: 1.9 mg/dL (ref 1.5–2.5)

## 2017-08-19 LAB — IBC PANEL
Iron: 63 ug/dL (ref 42–145)
SATURATION RATIOS: 18.7 % — AB (ref 20.0–50.0)
TRANSFERRIN: 241 mg/dL (ref 212.0–360.0)

## 2017-08-19 LAB — FERRITIN: Ferritin: 282.9 ng/mL (ref 10.0–291.0)

## 2017-08-19 LAB — VITAMIN B12: VITAMIN B 12: 273 pg/mL (ref 211–911)

## 2017-08-19 LAB — FOLATE: FOLATE: 21.8 ng/mL (ref >5.9)

## 2017-08-19 MED ORDER — FERROUS SULFATE 220 (44 FE) MG/5ML PO ELIX: 220.0000 mg | ORAL_SOLUTION | Freq: Two times a day (BID) | ORAL | 5 refills | Status: DC

## 2017-08-19 MED ORDER — CYANOCOBALAMIN 2000 MCG PO TABS: 2000.0000 ug | ORAL_TABLET | Freq: Every day | ORAL | 3 refills | Status: DC

## 2017-08-20 ENCOUNTER — Other Ambulatory Visit: Payer: Self-pay

## 2017-08-20 DIAGNOSIS — D508 Other iron deficiency anemias: Secondary | ICD-10-CM

## 2017-08-20 DIAGNOSIS — D513 Other dietary vitamin B12 deficiency anemia: Secondary | ICD-10-CM

## 2017-08-20 MED ORDER — CYANOCOBALAMIN 2000 MCG PO TABS: 2000.0000 ug | ORAL_TABLET | Freq: Every day | ORAL | 3 refills | Status: AC

## 2017-08-20 MED ORDER — FERROUS SULFATE 220 (44 FE) MG/5ML PO ELIX: 220.0000 mg | ORAL_SOLUTION | Freq: Two times a day (BID) | ORAL | 5 refills | Status: DC

## 2017-08-23 LAB — VITAMIN B1: VITAMIN B1 (THIAMINE): 20 nmol/L (ref 8–30)

## 2017-08-26 ENCOUNTER — Encounter (INDEPENDENT_AMBULATORY_CARE_PROVIDER_SITE_OTHER): Payer: Self-pay

## 2017-08-26 ENCOUNTER — Telehealth: Payer: Self-pay | Admitting: Physician Assistant

## 2017-08-26 ENCOUNTER — Ambulatory Visit (INDEPENDENT_AMBULATORY_CARE_PROVIDER_SITE_OTHER): Payer: PPO | Admitting: Physician Assistant

## 2017-08-26 ENCOUNTER — Encounter: Payer: Self-pay | Admitting: Physician Assistant

## 2017-08-26 VITALS — BP 122/60 | HR 48 | Ht 68.5 in | Wt 133.8 lb

## 2017-08-26 DIAGNOSIS — R001 Bradycardia, unspecified: Secondary | ICD-10-CM | POA: Diagnosis not present

## 2017-08-26 DIAGNOSIS — E785 Hyperlipidemia, unspecified: Secondary | ICD-10-CM

## 2017-08-26 DIAGNOSIS — H409 Unspecified glaucoma: Secondary | ICD-10-CM

## 2017-08-26 DIAGNOSIS — I10 Essential (primary) hypertension: Secondary | ICD-10-CM

## 2017-08-26 NOTE — Telephone Encounter (Signed)
New Message   pt verbalized that she is calling for rn   She had appointment today and she was instructed to stop one of her medications pt    verbalized that her ophthalmologist wants a fax that stated the reason why pt needs to discontinue the medication cosopt  Please fax to the attention of Doyce Para at [phone]

## 2017-08-26 NOTE — Telephone Encounter (Signed)
Faxing to Dr. Zenia Resides office @ [phone] pt's ov note from today per Estella Husk, PA.

## 2017-08-26 NOTE — Telephone Encounter (Signed)
Please send my note from today to Dr. Katy Fitch asking to stop timolol for bradycardia.

## 2017-08-26 NOTE — Patient Instructions (Signed)
Medication Instructions:  Your physician has recommended you make the following change in your medication:  1. Call your eye doctor and let know to stop COSOPT eye drops.     Labwork: -None  Testing/Procedures: Your physician has requested that you have an echocardiogram. Echocardiography is a painless test that uses sound waves to create images of your heart. It provides your doctor with information about the size and shape of your heart and how well your heart's chambers and valves are working. This procedure takes approximately one hour. There are no restrictions for this procedure.    Follow-Up: Your physician recommends that you keep your scheduled  follow-up appointment with Estella Husk, PA.   Any Other Special Instructions Will Be Listed Below (If Applicable).     If you need a refill on your cardiac medications before your next appointment, please call your pharmacy.

## 2017-08-26 NOTE — Progress Notes (Signed)
Cardiology Office Note    Date:  08/26/2017   ID:  Susan Mccarty, Susan Mccarty 1951/09/22, MRN [id number]  PCP:  Susan Lima, MD  Cardiologist:   Chief Complaint  Patient presents with  . New Patient (Initial Visit)    History of Present Illness:    Susan Mccarty is a 66 y.o. female who is being seen today for the evaluation of Bradycardia at the request of Susan Lima, MD.  Patient has a history of hypertension well controlled on chlorthalidone. She had a routine office visit 08/17/17 with Dr. Ronnald Mccarty and was found to be bradycardic at 40 bpm which was new for her. She was asymptomatic. Her heart rate was 58 in 2017 and a 48 hour Holter monitor was ordered but she never did it. She was hypokalemic and potassium started. Labs stable including thyroid. She is on timolol eyedrops.  Patient exercises regularly. Denies chest pain. She has dyspnea on exertion when exercising in the heat but otherwise not. Occasional dizziness when gets up from sewing machine. 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 She is very active. No family history of significant cardiac problems. She thinks her grandmother may have had something but she's not sure. She's never smoked. No HLD. Lipid profile in may cholesterol 188 LDL was slightly elevated at 119 HDL 58 triglycerides 54.   Past Medical History:  Diagnosis Date  . Glaucoma   . Glaucoma   . Vitamin D deficiency     Past Surgical History:  Procedure Laterality Date  . BREAST CYST EXCISION  2006   right     Current Medications: Current Meds  Medication Sig  . brimonidine (ALPHAGAN) 0.2 % ophthalmic solution Place 1 drop into both eyes 2 (two) times daily.   . chlorthalidone (HYGROTON) 25 MG tablet TAKE 1 TABLET BY MOUTH EVERY DAY MUST KEEP APPT  . Cholecalciferol (VITAMIN D-3) 1000 UNITS CAPS Take 1,000 Units by mouth daily.  . Cholecalciferol (VITAMIN D3) 2000 units capsule Take 2,000 Units by mouth daily.  . cyanocobalamin 2000 MCG tablet Take 1  tablet (2,000 mcg total) by mouth daily.  . ferrous sulfate 220 (44 Fe) MG/5ML solution Take 5 mLs (220 mg total) by mouth 2 (two) times daily with a meal.  . Iron-Vitamins (GERITOL) LIQD Take by mouth daily.  Marland Kitchen latanoprost (XALATAN) 0.005 % ophthalmic solution Place 1 drop into both eyes at bedtime.   . potassium chloride SA (K-DUR,KLOR-CON) 20 MEQ tablet Take 1 tablet (20 mEq total) by mouth 2 (two) times daily.  . vitamin A 8000 UNIT capsule Take 8,000 Units by mouth daily.  . vitamin C (ASCORBIC ACID) 500 MG tablet Take 500 mg by mouth daily.  . vitamin E 400 UNIT capsule Take 400 Units by mouth daily.  . [DISCONTINUED] dorzolamide-timolol (COSOPT) 22.3-6.8 MG/ML ophthalmic solution Place 1 drop into both eyes 2 (two) times daily.      Allergies:   Patient has no known allergies.   Social History   Social History  . Marital status: Single    Spouse name: N/A  . Number of children: 0  . Years of education: 12   Occupational History  . [employer]   Social History Main Topics  . Smoking status: Never Smoker  . Smokeless tobacco: Never Used  . Alcohol use 0.0 oz/week     Comment: rare use  . Drug use: No  . Sexual activity: Yes    Partners: Male   Other Topics Concern  .  None   Social History Narrative   HSG, no college. Single - in a relationship. No children. 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     Family History:  The patient's family history includes Alcohol abuse in her brother and mother; Breast cancer in her cousin; Diabetes in her mother; Hypertension in her father.   ROS:   Please see the history of present illness.    Review of Systems  Constitution: Negative.  HENT: Negative.   Eyes: Negative.   Cardiovascular: Negative.   Respiratory: Negative.   Hematologic/Lymphatic: Negative.   Musculoskeletal: Negative.  Negative for joint pain.  Gastrointestinal: Negative.   Genitourinary: Negative.   Neurological: Negative.     All other systems reviewed and are negative.   PHYSICAL EXAM:   VS:  BP 122/60 (BP Location: Left Arm, Patient Position: Sitting, Cuff Size: Normal)   Pulse (!) 48   Ht 5' 8.5" (1.74 m)   Wt 133 lb 12.8 oz (60.7 kg)   SpO2 99%   BMI 20.05 kg/m   Physical Exam  GEN: Well nourished, well developed, in no acute distress  HEENT: normal  Neck: no JVD, carotid bruits, or masses Cardiac:RRR; no murmurs, rubs, or gallops  Respiratory:  clear to auscultation bilaterally, normal work of breathing GI: soft, nontender, nondistended, + BS Ext: without cyanosis, clubbing, or edema, Good distal pulses bilaterally Neuro:  Alert and Oriented x 3 Psych: euthymic mood, full affect  Wt Readings from Last 3 Encounters:  08/26/17 133 lb 12.8 oz (60.7 kg)  08/17/17 133 lb (60.3 kg)  05/11/17 134 lb (60.8 kg)      Studies/Labs Reviewed:   EKG:  EKG is  ordered today.  The ekg ordered today demonstrates Sinus bradycardia 49 bpm  Recent Labs: 09/28/2016: ALT 8 08/17/2017: BUN 21; Creatinine, Ser 0.82; Hemoglobin 11.7; Platelets 200.0; Potassium 3.4; Sodium 139; TSH 1.03 08/19/2017: Magnesium 1.9   Lipid Panel    Component Value Date/Time   CHOL 188 05/11/2017 1026   TRIG 54.0 05/11/2017 1026   TRIG 30 10/25/2006 1012   HDL 58.20 05/11/2017 1026   CHOLHDL 3 05/11/2017 1026   VLDL 10.8 05/11/2017 1026   LDLCALC 119 (H) 05/11/2017 1026    Additional studies/ records that were reviewed today include:      ASSESSMENT:    1. Bradycardia   2. Essential hypertension   3. Hyperlipidemia with target LDL less than 130   4. Glaucoma, unspecified glaucoma type, unspecified laterality      PLAN:  In order of problems listed above:  Bradycardia with heart rate down to 40 on EKG at Susan Mccarty office. Patient asymptomatic. She exercises regularly. She has been on timolol eyedrops for glaucoma for many years. Recommend she call Dr. Katy Mccarty and see if there is another eyedrop she could use for  glaucoma. I will see her back in follow-up in several weeks after stopping timolol see what her heart rate is. Check 2-D echo for LV function. Discussed with Dr. Meda Mccarty who concurs.  Essential hypertension controlled on chlorthalidone  Hyperlipidemia with LDL 117. Trying diet control. Exercises regularly.  Block, recommend checking with Dr. Katy Mccarty change her atenolol to another eyedrop that does not have rate lowering side effects.    Medication Adjustments/Labs and Tests Ordered: Current medicines are reviewed at length with the patient today.  Concerns regarding medicines are outlined above.  Medication changes, Labs and Tests ordered today are listed in the Patient Instructions below. There are no Patient  Instructions on file for this visit.   Sumner Boast, PA-C  08/26/2017 11:56 AM    [facility] [place], [place], [place]  [address] Phone: [phone]; Fax: [phone]

## 2017-09-07 ENCOUNTER — Other Ambulatory Visit: Payer: Self-pay

## 2017-09-07 ENCOUNTER — Ambulatory Visit (HOSPITAL_COMMUNITY): Payer: PPO | Attending: Cardiology

## 2017-09-07 DIAGNOSIS — I10 Essential (primary) hypertension: Secondary | ICD-10-CM

## 2017-09-07 DIAGNOSIS — R001 Bradycardia, unspecified: Secondary | ICD-10-CM | POA: Diagnosis not present

## 2017-09-07 DIAGNOSIS — I071 Rheumatic tricuspid insufficiency: Secondary | ICD-10-CM | POA: Insufficient documentation

## 2017-09-07 DIAGNOSIS — E785 Hyperlipidemia, unspecified: Secondary | ICD-10-CM | POA: Diagnosis not present

## 2017-09-14 ENCOUNTER — Encounter (INDEPENDENT_AMBULATORY_CARE_PROVIDER_SITE_OTHER): Payer: Self-pay

## 2017-09-14 ENCOUNTER — Encounter: Payer: Self-pay | Admitting: Physician Assistant

## 2017-09-14 ENCOUNTER — Ambulatory Visit (INDEPENDENT_AMBULATORY_CARE_PROVIDER_SITE_OTHER): Payer: PPO | Admitting: Physician Assistant

## 2017-09-14 VITALS — BP 122/76 | HR 54 | Resp 16 | Ht 68.0 in | Wt 138.1 lb

## 2017-09-14 DIAGNOSIS — E785 Hyperlipidemia, unspecified: Secondary | ICD-10-CM

## 2017-09-14 DIAGNOSIS — I10 Essential (primary) hypertension: Secondary | ICD-10-CM | POA: Diagnosis not present

## 2017-09-14 DIAGNOSIS — R001 Bradycardia, unspecified: Secondary | ICD-10-CM

## 2017-09-14 NOTE — Progress Notes (Signed)
Cardiology Office Note    Date:  09/14/2017   ID:  Susan Mccarty, [name] 1951/01/24, MRN [id number]  PCP:  Janith Lima, MD  Cardiologist: Dr. Meda Coffee  No chief complaint on file.   History of Present Illness:  Susan Mccarty is a 66 y.o. female who I saw as new patient 08/26/17 for bradycardia with heart rates down to 40 bpm. She was asymptomatic. She exercises regularly and has some dyspnea on exertion when exercising in the heat but otherwise she has no symptoms. The only medication that could be lowering her heart rate was timolol for glaucoma and we asked her ophthalmologist to change this medication. Patient also had hypertension controlled with chlorthalidone and HLD diet controlled. 2-D echo 09/07/17 showed normal LVEF 55-60% with grade 1 DD mild TR and mildly elevated pulmonary pressure.  Patient comes in today for follow-up. She is feeling better off the timolol. She says her heart rates have been in the 50s at home when she checks her blood pressure. She denies any dizziness or presyncope.   Past Medical History:  Diagnosis Date  . Bradycardia 08/17/2017  . Bradycardia, sinus 06/02/2016  . Deficiency anemia 08/17/2017  . Essential hypertension 06/02/2016  . Fibroid, uterine 09/17/2016  . Glaucoma   . Glaucoma   . Hyperlipidemia with target LDL less than 130 05/17/2014   Framingham risk score is 4%  . Hypokalemia 05/11/2017  . Iron deficiency anemia secondary to inadequate dietary iron intake 08/19/2017  . Loss of weight 09/29/2016  . Osteopenia 09/29/2016  . Other dietary vitamin B12 deficiency anemia 08/19/2017  . Postmenopausal postcoital bleeding 09/29/2016  . Routine health maintenance 09/26/2011  . Visit for screening mammogram 05/27/2015  . Vitamin D deficiency   . Weight loss, non-intentional 09/17/2016    Past Surgical History:  Procedure Laterality Date  . BREAST CYST EXCISION  2006   right     Current Medications: Current Meds  Medication Sig  . brimonidine  (ALPHAGAN) 0.2 % ophthalmic solution Place 1 drop into both eyes 2 (two) times daily.   . chlorthalidone (HYGROTON) 25 MG tablet TAKE 1 TABLET BY MOUTH EVERY DAY MUST KEEP APPT  . Cholecalciferol (VITAMIN D-3) 1000 UNITS CAPS Take 1,000 Units by mouth daily.  . Cholecalciferol (VITAMIN D3) 2000 units capsule Take 2,000 Units by mouth daily.  . cyanocobalamin 2000 MCG tablet Take 1 tablet (2,000 mcg total) by mouth daily.  . ferrous sulfate 220 (44 Fe) MG/5ML solution Take 5 mLs (220 mg total) by mouth 2 (two) times daily with a meal.  . Iron-Vitamins (GERITOL) LIQD Take by mouth daily.  Marland Kitchen latanoprost (XALATAN) 0.005 % ophthalmic solution Place 1 drop into both eyes at bedtime.   . potassium chloride SA (K-DUR,KLOR-CON) 20 MEQ tablet Take 1 tablet (20 mEq total) by mouth 2 (two) times daily.  . vitamin A 8000 UNIT capsule Take 8,000 Units by mouth daily.  . vitamin C (ASCORBIC ACID) 500 MG tablet Take 500 mg by mouth daily.  . vitamin E 400 UNIT capsule Take 400 Units by mouth daily.     Allergies:   Patient has no known allergies.   Social History   Social History  . Marital status: Single    Spouse name: N/A  . Number of children: 0  . Years of education: 12   Occupational History  . [employer]   Social History Main Topics  . Smoking status: Never Smoker  . Smokeless tobacco: Never Used  .  Alcohol use 0.0 oz/week     Comment: rare use  . Drug use: No  . Sexual activity: Yes    Partners: Male   Other Topics Concern  . None   Social History Narrative   HSG, no college. Single - in a relationship. No children. 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     Family History:  The patient's family history includes Alcohol abuse in her brother and mother; Breast cancer in her cousin; Diabetes in her mother; Hypertension in her father.   ROS:   Please see the history of present illness.    Review of Systems  Constitution: Negative.  HENT:  Negative.   Eyes: Negative.   Cardiovascular: Positive for dyspnea on exertion.  Respiratory: Negative.   Hematologic/Lymphatic: Negative.   Musculoskeletal: Positive for joint swelling. Negative for joint pain.  Gastrointestinal: Negative.   Genitourinary: Negative.   Neurological: Negative.    All other systems reviewed and are negative.   PHYSICAL EXAM:   VS:  BP 122/76   Pulse (!) 54   Resp 16   Ht 5\' 8"  (1.727 m)   Wt 138 lb 1.9 oz (62.7 kg)   SpO2 98%   BMI 21.00 kg/m   Physical Exam  GEN: Well nourished, well developed, in no acute distress  Neck: no JVD, carotid bruits, or masses Cardiac:RRR; no murmurs, rubs, or gallops  Respiratory:  clear to auscultation bilaterally, normal work of breathing GI: soft, nontender, nondistended, + BS Ext: without cyanosis, clubbing, or edema, Good distal pulses bilaterally Neuro:  Alert and Oriented x 3 Psych: euthymic mood, full affect  Wt Readings from Last 3 Encounters:  09/14/17 138 lb 1.9 oz (62.7 kg)  08/26/17 133 lb 12.8 oz (60.7 kg)  08/17/17 133 lb (60.3 kg)      Studies/Labs Reviewed:   EKG:  EKG is  ordered today.  The ekg ordered today demonstrates Sinus bradycardia at 52 bpm  Recent Labs: 09/28/2016: ALT 8 08/17/2017: BUN 21; Creatinine, Ser 0.82; Hemoglobin 11.7; Platelets 200.0; Potassium 3.4; Sodium 139; TSH 1.03 08/19/2017: Magnesium 1.9   Lipid Panel    Component Value Date/Time   CHOL 188 05/11/2017 1026   TRIG 54.0 05/11/2017 1026   TRIG 30 10/25/2006 1012   HDL 58.20 05/11/2017 1026   CHOLHDL 3 05/11/2017 1026   VLDL 10.8 05/11/2017 1026   LDLCALC 119 (H) 05/11/2017 1026    Additional studies/ records that were reviewed today include:  2-D echo 09/07/17  Study Conclusions   - Left ventricle: The cavity size was normal. Wall thickness was   normal. Systolic function was normal. The estimated ejection   fraction was in the range of 55% to 60%. Wall motion was normal;   there were no regional  wall motion abnormalities. Doppler   parameters are consistent with abnormal left ventricular   relaxation (grade 1 diastolic dysfunction). - Pulmonary arteries: Systolic pressure was mildly increased.   Impressions:   - Normal LV systolic function; mild diastolic dysfunction; mild TR   with mildly elevated pulmonary pressure.     ASSESSMENT:    1. Bradycardia, sinus   2. Essential hypertension   3. Hyperlipidemia with target LDL less than 130      PLAN:  In order of problems listed above:  Bradycardia, asymptomaticHeart rate up in the 50s since timolol stopped. 2-D echo with normal LV EF mild diastolic dysfunction with mildly elevated pulmonary pressure. We'll follow-up with Dr. Meda Coffee in 6 months or sooner  if she is symptomatic.  Essential HTN controlled on chlorthalidone  Hyperlipidemia diet controlled    Medication Adjustments/Labs and Tests Ordered: Current medicines are reviewed at length with the patient today.  Concerns regarding medicines are outlined above.  Medication changes, Labs and Tests ordered today are listed in the Patient Instructions below. There are no Patient Instructions on file for this visit.   Sumner Boast, PA-C  09/14/2017 11:35 AM    [facility] [place], [place], [place]  [address] Phone: [phone]; Fax: [phone]

## 2017-09-14 NOTE — Patient Instructions (Signed)
Medication Instructions:  Your physician recommends that you continue on your current medications as directed. Please refer to the Current Medication list given to you today.   Labwork: None ordered  Testing/Procedures: None ordered  Follow-Up: Your physician wants you to follow-up in: 6 MONTHS WITH DR. NELSON.   You will receive a reminder letter in the mail two months in advance. If you don't receive a letter, please call our office to schedule the follow-up appointment.   Any Other Special Instructions Will Be Listed Below (If Applicable).     If you need a refill on your cardiac medications before your next appointment, please call your pharmacy.   

## 2017-09-26 ENCOUNTER — Ambulatory Visit (HOSPITAL_COMMUNITY)
Admission: EM | Admit: 2017-09-26 | Discharge: 2017-09-26 | Disposition: A | Discharge status: Home / Self Care | Payer: PPO | Attending: Internal Medicine | Admitting: Internal Medicine

## 2017-09-26 ENCOUNTER — Encounter (HOSPITAL_COMMUNITY): Payer: Self-pay | Admitting: Family Medicine

## 2017-09-26 DIAGNOSIS — M25511 Pain in right shoulder: Secondary | ICD-10-CM | POA: Diagnosis not present

## 2017-09-26 MED ORDER — NAPROXEN 500 MG PO TABS: 500.0000 mg | ORAL_TABLET | Freq: Two times a day (BID) | ORAL | 0 refills | Status: AC

## 2017-09-26 NOTE — ED Provider Notes (Signed)
[facility]    CSN: [id number] Arrival date & time: 09/26/17  1700     History   Chief Complaint Chief Complaint  Patient presents with  . Shoulder Pain    HPI Susan Mccarty is a 66 y.o. female.   66 year old female with history of sinus bradycardia, glaucoma, osteopenia comes in for 1 month of intermittent right shoulder pain. Patient describes it as an aching pain around the shoulder, denies any aggravating or alleviating factor. She has tried hot soaks/heating pads, massages without relief. She sews a lot, which requires repetative turning of the right shoulder. Some numbness and tingling of the shoulder when pain is present. Denies radiation of pain, injury, neck pain. Not currently in pain.      Past Medical History:  Diagnosis Date  . Bradycardia 08/17/2017  . Bradycardia, sinus 06/02/2016  . Deficiency anemia 08/17/2017  . Essential hypertension 06/02/2016  . Fibroid, uterine 09/17/2016  . Glaucoma   . Glaucoma   . Hyperlipidemia with target LDL less than 130 05/17/2014   Framingham risk score is 4%  . Hypokalemia 05/11/2017  . Iron deficiency anemia secondary to inadequate dietary iron intake 08/19/2017  . Loss of weight 09/29/2016  . Osteopenia 09/29/2016  . Other dietary vitamin B12 deficiency anemia 08/19/2017  . Postmenopausal postcoital bleeding 09/29/2016  . Routine health maintenance 09/26/2011  . Visit for screening mammogram 05/27/2015  . Vitamin D deficiency   . Weight loss, non-intentional 09/17/2016    Patient Active Problem List   Diagnosis Date Noted  . Other dietary vitamin B12 deficiency anemia 08/19/2017  . Iron deficiency anemia secondary to inadequate dietary iron intake 08/19/2017  . Deficiency anemia 08/17/2017  . Bradycardia 08/17/2017  . Osteopenia 09/29/2016  . Fibroid, uterine 09/17/2016  . Weight loss, non-intentional 09/17/2016  . Essential hypertension 06/02/2016  . Bradycardia, sinus 06/02/2016  . Visit for screening  mammogram 05/27/2015  . Hyperlipidemia with target LDL less than 130 05/17/2014  . Glaucoma 09/26/2011  . Routine health maintenance 09/26/2011    Past Surgical History:  Procedure Laterality Date  . BREAST CYST EXCISION  2006   right     OB History    Gravida Para Term Preterm AB Living   1 0     1 0   SAB TAB Ectopic Multiple Live Births                   Home Medications    Prior to Admission medications   Medication Sig Start Date End Date Taking? Authorizing Provider  brimonidine (ALPHAGAN) 0.2 % ophthalmic solution Place 1 drop into both eyes 2 (two) times daily.     [provider]  chlorthalidone (HYGROTON) 25 MG tablet TAKE 1 TABLET BY MOUTH EVERY DAY MUST KEEP APPT 05/26/17   Janith Lima, MD  Cholecalciferol (VITAMIN D-3) 1000 UNITS CAPS Take 1,000 Units by mouth daily.    [provider]  Cholecalciferol (VITAMIN D3) 2000 units capsule Take 2,000 Units by mouth daily.    [provider]  cyanocobalamin 2000 MCG tablet Take 1 tablet (2,000 mcg total) by mouth daily. 08/20/17   Janith Lima, MD  ferrous sulfate 220 (44 Fe) MG/5ML solution Take 5 mLs (220 mg total) by mouth 2 (two) times daily with a meal. 08/20/17   Janith Lima, MD  Iron-Vitamins (GERITOL) LIQD Take by mouth daily.    [provider]  latanoprost (XALATAN) 0.005 % ophthalmic solution Place 1 drop  into both eyes at bedtime.     [provider]  naproxen (NAPROSYN) 500 MG tablet Take 1 tablet (500 mg total) by mouth 2 (two) times daily. 09/26/17 10/06/17  Tasia Catchings, Demarkus Remmel V, PA-C  potassium chloride SA (K-DUR,KLOR-CON) 20 MEQ tablet Take 1 tablet (20 mEq total) by mouth 2 (two) times daily. 08/17/17   Janith Lima, MD  vitamin A 8000 UNIT capsule Take 8,000 Units by mouth daily.    [provider]  vitamin C (ASCORBIC ACID) 500 MG tablet Take 500 mg by mouth daily.    [provider]  vitamin E 400 UNIT capsule Take 400 Units by mouth daily.     [provider]    Family History Family History  Problem Relation Age of Onset  . Diabetes Mother   . Alcohol abuse Mother   . Hypertension Father   . Alcohol abuse Brother   . Breast cancer Cousin   . Stroke Neg Hx   . Cancer Neg Hx   . COPD Neg Hx   . Early death Neg Hx   . Hyperlipidemia Neg Hx   . Colon cancer Neg Hx   . Esophageal cancer Neg Hx   . Rectal cancer Neg Hx   . Stomach cancer Neg Hx     Social History Social History  Substance Use Topics  . Smoking status: Never Smoker  . Smokeless tobacco: Never Used  . Alcohol use 0.0 oz/week     Comment: rare use     Allergies   Patient has no known allergies.   Review of Systems Review of Systems  Reason unable to perform ROS: See HPI as above.     Physical Exam Triage Vital Signs ED Triage Vitals [09/26/17 1737]  Enc Vitals Group     BP 128/75     Pulse Rate (!) 53     Resp 18     Temp 98.3 F (36.8 C)     Temp src      SpO2 99 %     Weight      Height      Head Circumference      Peak Flow      Pain Score 8     Pain Loc      Pain Edu?      Excl. in [place]?    No data found.   Updated Vital Signs BP 128/75   Pulse (!) 53   Temp 98.3 F (36.8 C)   Resp 18   SpO2 99%   Physical Exam  Constitutional: She is oriented to person, place, and time. She appears well-developed and well-nourished. No distress.  HENT:  Head: Normocephalic and atraumatic.  Eyes: Pupils are equal, round, and reactive to light. Conjunctivae are normal.  Cardiovascular: Regular rhythm and normal heart sounds.  Bradycardia present.  Exam reveals no gallop and no friction rub.   No murmur heard. Pulmonary/Chest: Effort normal and breath sounds normal. She has no wheezes. She has no rales.  Musculoskeletal:  No tenderness on palpation of the spinous processes, neck, upper back, shoulders. Full range of motion of shoulder, neck, elbow. Strength normal and equal bilaterally. Sensation intact and equal  bilaterally.  Radial pulses 2+ and equal bilaterally. Capillary refill unable to assess due to nail polish.  Neurological: She is alert and oriented to person, place, and time.  Skin: Skin is warm and dry.     UC Treatments / Results  Labs (all labs ordered are listed,  but only abnormal results are displayed) Labs Reviewed - No data to display  EKG  EKG Interpretation None       Radiology No results found.  Procedures Procedures (including critical care time)  Medications Ordered in UC Medications - No data to display   Initial Impression / Assessment and Plan / UC Course  I have reviewed the triage vital signs and the nursing notes.  Pertinent labs & imaging results that were available during my care of the patient were reviewed by me and considered in my medical decision making (see chart for details).    Was not able to reproduce pain during exam. Discussed with patient pain could be due to inflammation/arthritis. Start NSAIDs. Ice compress. Follow up with PCP for further evaluation and referrals needed if symptoms do not improve.   Final Clinical Impressions(s) / UC Diagnoses   Final diagnoses:  Acute pain of right shoulder    New Prescriptions New Prescriptions   NAPROXEN (NAPROSYN) 500 MG TABLET    Take 1 tablet (500 mg total) by mouth 2 (two) times daily.      Ok Edwards, PA-C 09/26/17 [phone]

## 2017-09-26 NOTE — Discharge Instructions (Signed)
Intermittent right shoulder pain could be due to inflammation/arthritis. Start naproxen as directed. If using over the counter medicines, you can take ibuprofen 600mg  three times a day OR naproxen 440mg  twice a day for 10 days. Ice compress with elevation. Follow up with PCP for further evaluation and referrals needed if symptoms does not resolve or worsens.

## 2017-09-26 NOTE — ED Notes (Addendum)
Patient discharged by provider Cathlean Sauer, PA

## 2017-09-26 NOTE — ED Triage Notes (Signed)
Pt here for intermittent right shoulder pain x 1 month and worsening. Denies any specific injury

## 2017-10-07 ENCOUNTER — Other Ambulatory Visit: Payer: Self-pay | Admitting: Internal Medicine

## 2017-10-07 DIAGNOSIS — I10 Essential (primary) hypertension: Secondary | ICD-10-CM

## 2017-11-02 ENCOUNTER — Ambulatory Visit: Payer: PPO | Admitting: Gynecology

## 2017-11-02 ENCOUNTER — Encounter: Payer: Self-pay | Admitting: Gynecology

## 2017-11-02 VITALS — BP 122/80 | Ht 68.0 in | Wt 138.0 lb

## 2017-11-02 DIAGNOSIS — Z13820 Encounter for screening for osteoporosis: Secondary | ICD-10-CM | POA: Diagnosis not present

## 2017-11-02 DIAGNOSIS — N952 Postmenopausal atrophic vaginitis: Secondary | ICD-10-CM

## 2017-11-02 DIAGNOSIS — N930 Postcoital and contact bleeding: Secondary | ICD-10-CM

## 2017-11-02 DIAGNOSIS — Z01411 Encounter for gynecological examination (general) (routine) with abnormal findings: Secondary | ICD-10-CM

## 2017-11-02 DIAGNOSIS — D259 Leiomyoma of uterus, unspecified: Secondary | ICD-10-CM | POA: Diagnosis not present

## 2017-11-02 DIAGNOSIS — N951 Menopausal and female climacteric states: Secondary | ICD-10-CM

## 2017-11-02 NOTE — Patient Instructions (Signed)
Follow-up for bone density as scheduled.  Call if you do any further bleeding.  Follow-up in 1 year for annual exam.

## 2017-11-02 NOTE — Progress Notes (Signed)
Susan Mccarty 22-Feb-1951 [id number]        66 y.o.  G1P0010 for annual gynecologic exam.  Former patient of Dr. Toney Rakes.  Several issues noted below.  Past medical history,surgical history, problem list, medications, allergies, family history and social history were all reviewed and documented as reviewed in the EPIC chart.  ROS:  Performed with pertinent positives and negatives included in the history, assessment and plan.   Additional significant findings : None   Exam: Caryn Bee assistant Vitals:   11/02/17 0920  BP: 122/80  Weight: 138 lb (62.6 kg)  Height: 5\' 8"  (1.727 m)   Body mass index is 20.98 kg/m.  General appearance:  Normal affect, orientation and appearance. Skin: Grossly normal HEENT: Without gross lesions.  No cervical or supraclavicular adenopathy. Thyroid normal.  Lungs:  Clear without wheezing, rales or rhonchi Cardiac: RR, without RMG Abdominal:  Soft, nontender, without masses, guarding, rebound, organomegaly or hernia Breasts:  Examined lying and sitting without masses, retractions, discharge or axillary adenopathy. Pelvic:  Ext, BUS, Vagina: With atrophic changes  Cervix: With atrophic changes  Uterus: Nodular but normal size, midline and mobile, nontender   Adnexa: Without masses or tenderness    Anus and perineum: Normal   Rectovaginal: Normal sphincter tone without palpated masses or tenderness.    Assessment/Plan:  66 y.o. G39P0010 female for annual gynecologic exam.   1. History of leiomyoma.  Uterus is grossly normal in size with some nodularity consistent with her history.  I reviewed leiomyoma with her and the natural history.  She is asymptomatic from this and will continue to monitor with annual exams. 2. History of postcoital spotting last year.  Dr. Toney Rakes ordered an ultrasound to recheck her leiomyoma and assess endometrial echo.  Patient never followed up for this.  She no longer is having any bleeding after her visit with Dr.  Toney Rakes.  I reviewed the issues as to whether to proceed with the ultrasound now as she is no longer having the symptoms.  The risks of missed pathology to include endometrial polyps, hyperplasia or endometrial cancer was discussed with her.  The option to proceed with ultrasound now was discussed and offered.  At this point the patient declines and prefers observation.  She clearly understands the issues and risks of missed pathology.  She will call if she has any further spotting. 3. Postmenopausal/atrophic genital changes/menopausal symptoms.  Patient continues to have some hot flashes and sweats.  Seems to be getting better over time.  Options for management to include observation, OTC products and HRT discussed.  At this point the patient is not interested in HRT but will monitor her symptoms and follow-up if she wants to rediscuss this issue. 4. Pap smear/HPV 2017.  No Pap smear done today.  No history of significant abnormal Pap smears.  Current screening guidelines reviewed with her and will readdress on an annual basis. 5. Mammography coming due in December and I reminded her to schedule this.  Breast exam normal today. 6. Colonoscopy 2016.  Repeat at their recommended interval. 7. DEXA 2010.  Recommend DEXA now as patient is at risk given thin status and age.  Patient will schedule in follow-up for this. 8. Health maintenance.  No routine lab work done as patient reports this done elsewhere.  Follow-up for bone density.  Follow-up for annual exam in 1 year.  Additional time in excess of her routine gynecologic exam was spent in direct face to face counseling and coordination of  care in regards to her history of postcoital spotting and management plan as well as her menopausal symptoms.    Anastasio Auerbach MD, 9:47 AM 11/02/2017

## 2017-11-18 DIAGNOSIS — H401112 Primary open-angle glaucoma, right eye, moderate stage: Secondary | ICD-10-CM | POA: Diagnosis not present

## 2017-11-18 DIAGNOSIS — H25813 Combined forms of age-related cataract, bilateral: Secondary | ICD-10-CM | POA: Diagnosis not present

## 2017-11-18 DIAGNOSIS — H401123 Primary open-angle glaucoma, left eye, severe stage: Secondary | ICD-10-CM | POA: Diagnosis not present

## 2017-12-02 ENCOUNTER — Other Ambulatory Visit: Payer: Self-pay | Admitting: Internal Medicine

## 2017-12-02 DIAGNOSIS — I10 Essential (primary) hypertension: Secondary | ICD-10-CM

## 2018-01-21 DIAGNOSIS — M810 Age-related osteoporosis without current pathological fracture: Secondary | ICD-10-CM

## 2018-01-21 HISTORY — DX: Age-related osteoporosis without current pathological fracture: M81.0

## 2018-02-01 ENCOUNTER — Other Ambulatory Visit: Payer: Self-pay | Admitting: Gynecology

## 2018-02-01 ENCOUNTER — Ambulatory Visit (INDEPENDENT_AMBULATORY_CARE_PROVIDER_SITE_OTHER): Payer: PPO

## 2018-02-01 DIAGNOSIS — M810 Age-related osteoporosis without current pathological fracture: Secondary | ICD-10-CM

## 2018-02-01 DIAGNOSIS — M8589 Other specified disorders of bone density and structure, multiple sites: Secondary | ICD-10-CM

## 2018-02-02 ENCOUNTER — Telehealth: Payer: Self-pay | Admitting: Gynecology

## 2018-02-02 ENCOUNTER — Encounter: Payer: Self-pay | Admitting: Gynecology

## 2018-02-02 NOTE — Telephone Encounter (Signed)
Pt informed

## 2018-02-02 NOTE — Telephone Encounter (Signed)
Tell patient her bone density shows osteoporosis.  Recommend office visit to discuss treatment options.  She is at an increased risk of fracture.

## 2018-03-16 ENCOUNTER — Encounter: Payer: Self-pay | Admitting: Gynecology

## 2018-03-16 ENCOUNTER — Ambulatory Visit: Payer: PPO | Admitting: Gynecology

## 2018-03-16 VITALS — BP 120/74

## 2018-03-16 DIAGNOSIS — M810 Age-related osteoporosis without current pathological fracture: Secondary | ICD-10-CM | POA: Diagnosis not present

## 2018-03-16 LAB — HM DEXA SCAN: HM Dexa Scan: -2.5

## 2018-03-16 MED ORDER — ALENDRONATE SODIUM 70 MG PO TABS: 70.0000 mg | ORAL_TABLET | ORAL | 11 refills | Status: DC

## 2018-03-16 NOTE — Patient Instructions (Signed)
Alendronate; Cholecalciferol tablets  What is this medicine?  ALENDRONATE; CHOLECALCIFEROL (a LEN droe nate; KOL e cal SIF er ol) has two medicines to help reduce calcium loss from bones and to increase the production of normal, healthy bone in patients with osteoporosis.  This medicine may be used for other purposes; ask your health care provider or pharmacist if you have questions.  COMMON BRAND NAME(S): Fosamax Plus D  What should I tell my health care provider before I take this medicine?  They need to know if you have any of these conditions:  - dental disease  -kidney disease  -low level or high level of blood calcium  -problems sitting or standing for 30 minutes  -problems swallowing  -stomach, intestine, or esophagus problems like acid reflux or GERD  -vitamin D toxicity  -an unusual or allergic reaction to alendronate, cholecalciferol, medicines, foods, lactose, dyes, or preservatives  -pregnant or trying to get pregnant  -breast-feeding  How should I use this medicine?  You must take this medicine exactly as directed or you will lower the amount of medicine you absorb into your body or you may cause yourself harm. Take your dose by mouth first thing in the morning, after you are up for the day. Do not eat or drink anything before you take this medicine. Swallow your medicine with a full glass (6 to 8 fluid ounces) of plain water. Do not take this tablet with any with any other drink. Follow the directions on the prescription label. After taking this medicine, do not eat breakfast, drink, or take any medicines or vitamins for at least 30 minutes. Stand or sit up for at least 30 minutes after you take this medicine; do not lie down. Take the medicine on the same day every week. Do not take your medicine more often than directed. Do not stop taking except on your doctor's advice.  Talk to your pediatrician regarding the use of this medicine in children. Special care may be needed.   Overdosage: If you think you have taken too much of this medicine contact a poison control center or emergency room at once.  NOTE: This medicine is only for you. Do not share this medicine with others.  What if I miss a dose?  If you miss a dose, take the dose on the morning after you remember. Take your next dose on your regular chosen day of the week, but do not take 2 tablets on the same day. Do not take double or extra doses.  What may interact with this medicine?  -antacids  -aspirin and aspirin like drugs  -calcium supplements, especially calcium with vitamin D  -cholestyramine or colestipol  -cimetidine, ranitidine, or other medicines used to decrease stomach acid  -corticosteroids  -iron supplements  -magnesium supplements  -mineral oil  -NSAIDs, medicines for pain and inflammation, like ibuprofen or naproxen  -orlistat  -phenobarbital  -phenytoin  -phosphorous supplements  -primidone  -steroid medicines like prednisone or cortisone  -thiazide diuretics  -vitamins with minerals, especially with vitamin D  This list may not describe all possible interactions. Give your health care provider a list of all the medicines, herbs, non-prescription drugs, or dietary supplements you use. Also tell them if you smoke, drink alcohol, or use illegal drugs. Some items may interact with your medicine.  What should I watch for while using this medicine?  Visit your doctor or health care professional for regular checkups. It may be some time before you see the benefit from   this medicine. Do not stop taking your medicine unless your doctor tells you to. Your doctor may order blood tests or other tests to see how you are doing.  You should make sure that you get enough calcium and vitamin D while you are taking this medicine. Discuss the foods you eat and the vitamins you take with your health care professional.  If you have pain when swallowing, difficulty swallowing, heartburn, or  stomach pain, immediately call your doctor or health care professional.  If you are taking an antacid, a mineral supplement like calcium or iron, or a vitamin with minerals, wait to take them at least 30 minutes after you take this medicine. Do not take them at same time.  This medicine can make you more sensitive to the sun. If you get a rash while taking this medicine, sunlight may cause the rash to get worse. Keep out of the sun. If you cannot avoid being in the sun, wear protective clothing and use sunscreen. Do not use sun lamps or tanning beds/booths.  Some people who take this medicine have severe bone, joint, and/or muscle pain. This medicine may also increase your risk for a broken thigh bone. Tell your doctor right away if you have pain in your upper leg or groin. Tell your doctor if you have any pain that does not go away or that gets worse.  What side effects may I notice from receiving this medicine?  Side effects that you should report to your doctor or health care professional as soon as possible:  -allergic reactions like skin rash, itching or hives, swelling of the face, lips, or tongue  -bone, muscle, or joint pain  -changes in vision  -heartburn or chest pain  -pain or difficulty swallowing  -redness, blistering, peeling or loosening of the skin, including inside the mouth  -stomach pain  -unusual bleeding or bruising  -unusually weak or tired  -vomiting  Side effects that usually do not require medical attention (report to your doctor or health care professional if they continue or are bothersome):  -diarrhea or constipation  -headache  -nausea  -stomach gas or fullness  This list may not describe all possible side effects. Call your doctor for medical advice about side effects. You may report side effects to FDA at 1-800-FDA-1088.  Where should I keep my medicine?  Keep out of the reach of children.  Store at room temperature between 20 and 25 degrees C (68 and 77 degrees  F). Protect the medicine from moisture and light. Throw away any unused medicine after the expiration date.  NOTE: This sheet is a summary. It may not cover all possible information. If you have questions about this medicine, talk to your doctor, pharmacist, or health care provider.  © 2018 [employer]vier/Gold Standard (2011-06-05 08:57:31)

## 2018-03-16 NOTE — Progress Notes (Signed)
    Susan Mccarty [birth year] [id number]        66 y.o.  G1P0010 presents to discuss her most recent bone density that shows osteoporosis.  Past medical history,surgical history, problem list, medications, allergies, family history and social history were all reviewed and documented in the EPIC chart.  Directed ROS with pertinent positives and negatives documented in the history of present illness/assessment and plan.  Exam: Vitals:   03/16/18 0830  BP: 120/74   General appearance:  Normal  Assessment/Plan:  67 y.o. G1P0010 with DEXA showing T score -2.5.  All other measurements within the -2 to --2.4 range.  Comparison to her prior DEXA at the same facility 2010 showed a 18-19% loss at all measured sites.  The patient and I reviewed her bone density report in detail and I provided her a copy of this to have.  We discussed osteoporosis and the risks of progression to worsening osteoporosis as well as the increased risk of fracture not only now but worsening risk as she ages.  We discussed the sequela of fracture to include increased risk of death following hip fracture particularly.  She is thin which also contributes to her risk.  She does walk on a regular basis and is active.  She eats a regular diet and takes extra vitamin D.  Her last vitamin D level was in the 30 range last year.  We reviewed behavior modification to include increasing weightbearing exercise such as walking more vigorously, calcium intake and vitamin D supplementation.  I recommended we check a vitamin D level today as she will have that done before leaving the office.  My recommendation would be to consider medication at this time.  We discussed the various medications that are available to include bisphosphate's such as alendronate, Actonel/Boniva and Reclast.  We discussed Prolia as well as Evista.  At this point do not feel she is a candidate for Forteo.  The pros and cons of each choice as well as the risks versus benefits  reviewed to include GERD, osteonecrosis of the jaw, atypical fractures, rashes, infections and thrombosis.  At this point the patient wants to go ahead and start alendronate weekly.  We reviewed how to take the medication and the potential side effects that she should call for.  She will go ahead and start this now and let me know if she has any issues.  Assuming she does well then we will plan on repeating the bone density in 2 years.  Greater than 50% of our 25-minute visit was spent in direct face to face counseling and coordination of care with the patient.    Anastasio Auerbach MD, 9:06 AM 03/16/2018

## 2018-03-17 ENCOUNTER — Other Ambulatory Visit: Payer: Self-pay | Admitting: *Deleted

## 2018-03-17 DIAGNOSIS — E559 Vitamin D deficiency, unspecified: Secondary | ICD-10-CM

## 2018-03-17 LAB — VITAMIN D 25 HYDROXY (VIT D DEFICIENCY, FRACTURES): VIT D 25 HYDROXY: 25 ng/mL — AB (ref 30–100)

## 2018-04-09 ENCOUNTER — Other Ambulatory Visit: Payer: Self-pay | Admitting: Internal Medicine

## 2018-06-14 ENCOUNTER — Ambulatory Visit: Payer: PPO | Admitting: Internal Medicine

## 2018-07-12 ENCOUNTER — Encounter: Payer: Self-pay | Admitting: *Deleted

## 2018-07-12 DIAGNOSIS — H25813 Combined forms of age-related cataract, bilateral: Secondary | ICD-10-CM | POA: Diagnosis not present

## 2018-07-12 DIAGNOSIS — H401123 Primary open-angle glaucoma, left eye, severe stage: Secondary | ICD-10-CM | POA: Diagnosis not present

## 2018-07-29 ENCOUNTER — Encounter: Payer: Self-pay | Admitting: Internal Medicine

## 2018-08-31 ENCOUNTER — Ambulatory Visit: Payer: PPO

## 2018-08-31 DIAGNOSIS — H401121 Primary open-angle glaucoma, left eye, mild stage: Secondary | ICD-10-CM | POA: Diagnosis not present

## 2018-08-31 DIAGNOSIS — H401112 Primary open-angle glaucoma, right eye, moderate stage: Secondary | ICD-10-CM | POA: Diagnosis not present

## 2018-09-02 ENCOUNTER — Telehealth: Payer: Self-pay | Admitting: Internal Medicine

## 2018-09-02 ENCOUNTER — Other Ambulatory Visit: Payer: Self-pay | Admitting: Internal Medicine

## 2018-09-02 DIAGNOSIS — I10 Essential (primary) hypertension: Secondary | ICD-10-CM

## 2018-09-02 MED ORDER — CHLORTHALIDONE 25 MG PO TABS: 25.0000 mg | ORAL_TABLET | Freq: Every day | ORAL | 0 refills | Status: DC

## 2018-09-02 NOTE — Telephone Encounter (Signed)
We sent medication this morning.

## 2018-09-02 NOTE — Telephone Encounter (Signed)
Copied from [place] [phone]. Topic: Quick Communication - Rx Refill/Question >> Sep 02, 2018  9:50 AM Margot Ables wrote: Medication: chlorthalidone (HYGROTON) 25 MG tablet  - pt states RX refill was denied - she has appt 09/07/18 for AWV with nurse & has scheduled OV 09/28/18 with Dr. Ronnald Ramp - pt is requesting refill until next appt with Dr. Ronnald Ramp.  Has the patient contacted their pharmacy? yes - no refills and states RX denied  Preferred Pharmacy (with phone number or street name): [facility], [place] [phone] (Phone) [phone] (Fax)

## 2018-09-05 NOTE — Progress Notes (Addendum)
Subjective:   Susan Mccarty is a 67 y.o. female who presents for Medicare Annual (Subsequent) preventive examination.  Review of Systems:  No ROS.  Medicare Wellness Visit. Additional risk factors are reflected in the social history.  Cardiac Risk Factors include: advanced age (>86men, >40 women);dyslipidemia Sleep patterns: feels rested on waking, gets up 1 times nightly to void and sleeps 7 hours nightly.    Home Safety/Smoke Alarms: Feels safe in home. Smoke alarms in place.  Living environment; residence and Firearm Safety: 1-story house/ trailer, no firearms. Lives alone, no needs for DME, good support system Seat Belt Safety/Bike Helmet: Wears seat belt.      Objective:     Vitals: BP (!) 108/58   Pulse (!) 52   Resp 17   Ht 5\' 8"  (1.727 m)   Wt 140 lb (63.5 kg)   SpO2 100%   BMI 21.29 kg/m   Body mass index is 21.29 kg/m.  Advanced Directives 09/07/2018 09/30/2015  Does Patient Have a Medical Advance Directive? No No  Would patient like information on creating a medical advance directive? Yes (ED - Information included in AVS) -    Tobacco Social History   Tobacco Use  Smoking Status Never Smoker  Smokeless Tobacco Never Used     Counseling given: Not Answered  Past Medical History:  Diagnosis Date  . Bradycardia 08/17/2017  . Bradycardia, sinus 06/02/2016  . Deficiency anemia 08/17/2017  . Essential hypertension 06/02/2016  . Fibroid, uterine 09/17/2016  . Glaucoma   . Hyperlipidemia with target LDL less than 130 05/17/2014   Framingham risk score is 4%  . Hypokalemia 05/11/2017  . Iron deficiency anemia secondary to inadequate dietary iron intake 08/19/2017  . Loss of weight 09/29/2016  . Osteoporosis 01/2018   T score -2.5  . Other dietary vitamin B12 deficiency anemia 08/19/2017  . Postmenopausal postcoital bleeding 09/29/2016  . Routine health maintenance 09/26/2011  . Visit for screening mammogram 05/27/2015  . Vitamin D deficiency   . Weight loss,  non-intentional 09/17/2016   Past Surgical History:  Procedure Laterality Date  . BREAST CYST EXCISION  2006   right    Family History  Problem Relation Age of Onset  . Diabetes Mother   . Alcohol abuse Mother   . Hypertension Father   . Alcohol abuse Brother   . Diabetes Sister   . Hypertension Sister   . Breast cancer Cousin   . Stroke Neg Hx   . Cancer Neg Hx   . COPD Neg Hx   . Early death Neg Hx   . Hyperlipidemia Neg Hx   . Colon cancer Neg Hx   . Esophageal cancer Neg Hx   . Rectal cancer Neg Hx   . Stomach cancer Neg Hx    Social History   Socioeconomic History  . Marital status: Single    Spouse name: Not on file  . Number of children: 0  . Years of education: 57  . Highest education level: Not on file  Occupational History  . Occupation: SEAMSTRESS    Employer: [employer]  . Financial resource strain: Not hard at all  . Food insecurity:    Worry: Never true    Inability: Never true  . Transportation needs:    Medical: No    Non-medical: No  Tobacco Use  . Smoking status: Never Smoker  . Smokeless tobacco: Never Used  Substance and Sexual Activity  . Alcohol use: Yes  Alcohol/week: 0.0 standard drinks    Comment: rare use  . Drug use: No  . Sexual activity: Yes    Partners: Male    Comment: 1st intercourse 67 yo-Fewer than 5 partners  Lifestyle  . Physical activity:    Days per week: 2 days    Minutes per session: 40 min  . Stress: Not at all  Relationships  . Social connections:    Talks on phone: Not on file    Gets together: Not on file    Attends religious service: Not on file    Active member of club or organization: Not on file    Attends meetings of clubs or organizations: Not on file    Relationship status: Not on file  Other Topics Concern  . Not on file  Social History Narrative   HSG, no college. Single - in a relationship. No children. 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     Outpatient Encounter Medications as of 09/07/2018  Medication Sig  . alendronate (FOSAMAX) 70 MG tablet Take 1 tablet (70 mg total) by mouth every 7 (seven) days. Take with a full glass of water on an empty stomach.  . brimonidine (ALPHAGAN) 0.2 % ophthalmic solution Place 1 drop into both eyes 2 (two) times daily.   . Brinzolamide-Brimonidine (SIMBRINZA) 1-0.2 % SUSP Apply to eye.  . chlorthalidone (HYGROTON) 25 MG tablet Take 1 tablet (25 mg total) by mouth daily.  . Cholecalciferol (VITAMIN D3) 2000 units capsule Take 2,000 Units by mouth daily.  . cyanocobalamin 2000 MCG tablet Take 1 tablet (2,000 mcg total) by mouth daily.  . ferrous sulfate 220 (44 Fe) MG/5ML solution Take 5 mLs (220 mg total) by mouth 2 (two) times daily with a meal.  . Iron-Vitamins (GERITOL) LIQD Take by mouth daily.  Marland Kitchen latanoprost (XALATAN) 0.005 % ophthalmic solution Place 1 drop into both eyes at bedtime.   . potassium chloride SA (K-DUR,KLOR-CON) 20 MEQ tablet Take 1 tablet (20 mEq total) by mouth 2 (two) times daily.  . vitamin A 8000 UNIT capsule Take 8,000 Units by mouth daily.  . vitamin C (ASCORBIC ACID) 500 MG tablet Take 500 mg by mouth daily.  . vitamin E 400 UNIT capsule Take 400 Units by mouth daily.   No facility-administered encounter medications on file as of 09/07/2018.     Activities of Daily Living In your present state of health, do you have any difficulty performing the following activities: 09/07/2018  Hearing? N  Vision? N  Difficulty concentrating or making decisions? N  Walking or climbing stairs? N  Dressing or bathing? N  Doing errands, shopping? N  Preparing Food and eating ? N  Using the Toilet? N  In the past six months, have you accidently leaked urine? N  Do you have problems with loss of bowel control? N  Managing your Medications? N  Managing your Finances? N  Housekeeping or managing your Housekeeping? N  Some recent data might be hidden    Patient Care  Team: Janith Lima, MD as PCP - General (Internal Medicine) Lorelle Gibbs, MD (Radiology) Phineas Real Belinda Block, MD as Consulting Physician (Gynecology)    Assessment:   This is a routine wellness examination for Susan Mccarty. Physical assessment deferred to PCP.   Exercise Activities and Dietary recommendations Current Exercise Habits: Home exercise routine, Type of exercise: walking;strength training/weights;calisthenics, Time (Minutes): 45, Frequency (Times/Week): 3, Weekly Exercise (Minutes/Week): 135, Intensity: Mild, Exercise limited by: None identified  Diet (  meal preparation, eat out, water intake, caffeinated beverages, dairy products, fruits and vegetables): in general, a "healthy" diet  , well balanced   Reviewed heart healthy diet. Encouraged patient to increase daily water and healthy fluid intake. Discussed drinking high protein smoothies to assist with nutrition and weight gain.  Goals    . Patient Stated     Work towards working less hours to do things just for me.       Fall Risk Fall Risk  09/07/2018 05/11/2017  Falls in the past year? No No    Depression Screen PHQ 2/9 Scores 09/07/2018 05/11/2017 05/11/2017  PHQ - 2 Score 0 0 0  PHQ- 9 Score - 3 -     Cognitive Function MMSE - Mini Mental State Exam 09/07/2018  Orientation to time 5  Orientation to Place 5  Registration 3  Attention/ Calculation 5  Recall 3  Language- name 2 objects 2  Language- repeat 1  Language- follow 3 step command 3  Language- read & follow direction 1  Write a sentence 1  Copy design 1  Total score 30        Immunization History  Administered Date(s) Administered  . Tdap 09/04/2010    Screening Tests Health Maintenance  Topic Date Due  . PNA vac Low Risk Adult (1 of 2 - PCV13) 09/04/2016  . INFLUENZA VACCINE  07/21/2018  . COLONOSCOPY  10/22/2018  . MAMMOGRAM  12/02/2018  . TETANUS/TDAP  09/04/2020  . DEXA SCAN  Completed  . Hepatitis C Screening  Completed       Plan:     Want to discuss pneumonia and flu vaccinations with PCP at upcoming visit 09/28/18, education regarding vaccines was provided to patient today during AWV.   Continue doing brain stimulating activities (puzzles, reading, adult coloring books, staying active) to keep memory sharp.   Continue to eat heart healthy diet (full of fruits, vegetables, whole grains, lean protein, water--limit salt, fat, and sugar intake) and increase physical activity as tolerated.  I have personally reviewed and noted the following in the patient's chart:   . Medical and social history . Use of alcohol, tobacco or illicit drugs  . Current medications and supplements . Functional ability and status . Nutritional status . Physical activity . Advanced directives . List of other physicians . Vitals . Screenings to include cognitive, depression, and falls . Referrals and appointments  In addition, I have reviewed and discussed with patient certain preventive protocols, quality metrics, and best practice recommendations. A written personalized care plan for preventive services as well as general preventive health recommendations were provided to patient.     Michiel Cowboy, RN  09/07/2018   Medical screening examination/treatment/procedure(s) were performed by non-physician practitioner and as supervising physician I was immediately available for consultation/collaboration. I agree with above. Scarlette Calico, MD

## 2018-09-07 ENCOUNTER — Ambulatory Visit (INDEPENDENT_AMBULATORY_CARE_PROVIDER_SITE_OTHER): Payer: PPO | Admitting: *Deleted

## 2018-09-07 VITALS — BP 108/58 | HR 52 | Resp 17 | Ht 68.0 in | Wt 140.0 lb

## 2018-09-07 DIAGNOSIS — Z0000 Encounter for general adult medical examination without abnormal findings: Secondary | ICD-10-CM

## 2018-09-07 NOTE — Patient Instructions (Addendum)
Continue doing brain stimulating activities (puzzles, reading, adult coloring books, staying active) to keep memory sharp.   Continue to eat heart healthy diet (full of fruits, vegetables, whole grains, lean protein, water--limit salt, fat, and sugar intake) and increase physical activity as tolerated.   Susan Mccarty , Thank you for taking time to come for your Medicare Wellness Visit. I appreciate your ongoing commitment to your health goals. Please review the following plan we discussed and let me know if I can assist you in the future.   These are the goals we discussed: Goals    . Patient Stated     Work towards working less hours to do things just for me.       This is a list of the screening recommended for you and due dates:  Health Maintenance  Topic Date Due  . Pneumonia vaccines (1 of 2 - PCV13) 09/04/2016  . Flu Shot  07/21/2018  . Colon Cancer Screening  10/22/2018  . Mammogram  12/02/2018  . Tetanus Vaccine  09/04/2020  . DEXA scan (bone density measurement)  Completed  .  Hepatitis C: One time screening is recommended by Center for Disease Control  (CDC) for  adults born from [birth year] through 1965.   Completed    Protein Content in Foods Generally, most healthy people need around 50 grams of protein each day. Depending on your overall health, you may need more or less protein in your diet. Talk to your health care provider or dietitian about how much protein you need. See the following list for the protein content of some common foods. High-protein foods High-protein foods contain 4 grams (4 g) or more of protein per serving. They include:  Beef, ground sirloin (cooked) - 3 oz have 24 g of protein.  Cheese (hard) - 1 oz has 7 g of protein.  Chicken breast, boneless and skinless (cooked) - 3 oz have 13.4 g of protein.  Cottage cheese - 1/2 cup has 13.4 g of protein.  Egg - 1 egg has 6 g of protein.  Fish, filet (cooked) - 1 oz has 6-7 g of protein.  Garbanzo beans  (canned or cooked) - 1/2 cup has 6-7 g of protein.  Kidney beans (canned or cooked) - 1/2 cup has 6-7 g of protein.  Lamb (cooked) - 3 oz has 24 g of protein.  Milk - 1 cup (8 oz) has 8 g of protein.  Nuts (peanuts, pistachios, almonds) - 1 oz has 6 g of protein.  Peanut butter - 1 oz has 7-8 g of protein.  Pork tenderloin (cooked) - 3 oz has 18.4 g of protein.  Pumpkin seeds - 1 oz has 8.5 g of protein.  Soybeans (roasted) - 1 oz has 8 g of protein.  Soybeans (cooked) - 1/2 cup has 11 g of protein.  Soy milk - 1 cup (8 oz) has 5-10 g of protein.  Soy or vegetable patty - 1 patty has 11 g of protein.  Sunflower seeds - 1 oz has 5.5 g of protein.  Tofu (firm) - 1/2 cup has 20 g of protein.  Tuna (canned in water) - 3 oz has 20 g of protein.  Yogurt - 6 oz has 8 g of protein.  Low-protein foods Low-protein foods contain 3 grams (3 g) or less of protein per serving. They include:  Beets (raw or cooked) - 1/2 cup has 1.5 g of protein.  Bran cereal - 1/2 cup has 2-3 g of protein.  Bread - 1 slice has 2.5 g of protein.  Broccoli (raw or cooked) - 1/2 cup has 2 g of protein.  Collard greens (raw or cooked) - 1/2 cup has 2 g of protein.  Corn (fresh or cooked) - 1/2 cup has 2 g of protein.  Cream cheese - 1 oz has 2 g of protein.  Creamer (half-and-half) - 1 oz has 1 g of protein.  Flour tortilla - 1 tortilla has 2.5 g of protein  Frozen yogurt - 1/2 cup has 3 g of protein.  Fruit or vegetable juice - 1/2 cup has 1 g of protein.  Green beans (raw or cooked) - 1/2 cup has 1 g of protein.  Green peas (canned) - 1/2 cup has 3.5 g of protein.  Muffins - 1 small muffin (2 oz) has 3 g of protein.  Oatmeal (cooked) - 1/2 cup has 3 g of protein.  Potato (baked with skin) - 1 medium potato has 3 g of protein.  Rice (cooked) - 1/2 cup has 2.5-3.5 g of protein.  Sour cream - 1/2 cup has 2.5 g of protein.  Spinach (cooked) - 1/2 cup has 3 g of protein.  Squash  (cooked) - 1/2 cup has 1.5 g of protein.  Actual amounts of protein may be different depending on processing. Talk with your health care provider or dietitian about what foods are recommended for you. This information is not intended to replace advice given to you by your health care provider. Make sure you discuss any questions you have with your health care provider. Document Released: 03/07/2016 Document Revised: 08/17/2016 Document Reviewed: 08/17/2016 Elsevier Interactive Patient Education  2018 [place] Maintenance, Female Adopting a healthy lifestyle and getting preventive care can go a long way to promote health and wellness. Talk with your health care provider about what schedule of regular examinations is right for you. This is a good chance for you to check in with your provider about disease prevention and staying healthy. In between checkups, there are plenty of things you can do on your own. Experts have done a lot of research about which lifestyle changes and preventive measures are most likely to keep you healthy. Ask your health care provider for more information. Weight and diet Eat a healthy diet  Be sure to include plenty of vegetables, fruits, low-fat dairy products, and lean protein.  Do not eat a lot of foods high in solid fats, added sugars, or salt.  Get regular exercise. This is one of the most important things you can do for your health. ? Most adults should exercise for at least 150 minutes each week. The exercise should increase your heart rate and make you sweat (moderate-intensity exercise). ? Most adults should also do strengthening exercises at least twice a week. This is in addition to the moderate-intensity exercise.  Maintain a healthy weight  Body mass index (BMI) is a measurement that can be used to identify possible weight problems. It estimates body fat based on height and weight. Your health care provider can help determine your BMI and  help you achieve or maintain a healthy weight.  For females 26 years of age and older: ? A BMI below 18.5 is considered underweight. ? A BMI of 18.5 to 24.9 is normal. ? A BMI of 25 to 29.9 is considered overweight. ? A BMI of 30 and above is considered obese.  Watch levels of cholesterol and blood lipids  You should start having your blood  tested for lipids and cholesterol at 67 years of age, then have this test every 5 years.  You may need to have your cholesterol levels checked more often if: ? Your lipid or cholesterol levels are high. ? You are older than 67 years of age. ? You are at high risk for heart disease.  Cancer screening Lung Cancer  Lung cancer screening is recommended for adults 57-69 years old who are at high risk for lung cancer because of a history of smoking.  A yearly low-dose CT scan of the lungs is recommended for people who: ? Currently smoke. ? Have quit within the past 15 years. ? Have at least a 30-pack-year history of smoking. A pack year is smoking an average of one pack of cigarettes a day for 1 year.  Yearly screening should continue until it has been 15 years since you quit.  Yearly screening should stop if you develop a health problem that would prevent you from having lung cancer treatment.  Breast Cancer  Practice breast self-awareness. This means understanding how your breasts normally appear and feel.  It also means doing regular breast self-exams. Let your health care provider know about any changes, no matter how small.  If you are in your 20s or 30s, you should have a clinical breast exam (CBE) by a health care provider every 1-3 years as part of a regular health exam.  If you are 13 or older, have a CBE every year. Also consider having a breast X-ray (mammogram) every year.  If you have a family history of breast cancer, talk to your health care provider about genetic screening.  If you are at high risk for breast cancer, talk to  your health care provider about having an MRI and a mammogram every year.  Breast cancer gene (BRCA) assessment is recommended for women who have family members with BRCA-related cancers. BRCA-related cancers include: ? Breast. ? Ovarian. ? Tubal. ? Peritoneal cancers.  Results of the assessment will determine the need for genetic counseling and BRCA1 and BRCA2 testing.  Cervical Cancer Your health care provider may recommend that you be screened regularly for cancer of the pelvic organs (ovaries, uterus, and vagina). This screening involves a pelvic examination, including checking for microscopic changes to the surface of your cervix (Pap test). You may be encouraged to have this screening done every 3 years, beginning at age 26.  For women ages 6-65, health care providers may recommend pelvic exams and Pap testing every 3 years, or they may recommend the Pap and pelvic exam, combined with testing for human papilloma virus (HPV), every 5 years. Some types of HPV increase your risk of cervical cancer. Testing for HPV may also be done on women of any age with unclear Pap test results.  Other health care providers may not recommend any screening for nonpregnant women who are considered low risk for pelvic cancer and who do not have symptoms. Ask your health care provider if a screening pelvic exam is right for you.  If you have had past treatment for cervical cancer or a condition that could lead to cancer, you need Pap tests and screening for cancer for at least 20 years after your treatment. If Pap tests have been discontinued, your risk factors (such as having a new sexual partner) need to be reassessed to determine if screening should resume. Some women have medical problems that increase the chance of getting cervical cancer. In these cases, your health care provider may recommend  more frequent screening and Pap tests.  Colorectal Cancer  This type of cancer can be detected and often  prevented.  Routine colorectal cancer screening usually begins at 67 years of age and continues through 67 years of age.  Your health care provider may recommend screening at an earlier age if you have risk factors for colon cancer.  Your health care provider may also recommend using home test kits to check for hidden blood in the stool.  A small camera at the end of a tube can be used to examine your colon directly (sigmoidoscopy or colonoscopy). This is done to check for the earliest forms of colorectal cancer.  Routine screening usually begins at age 75.  Direct examination of the colon should be repeated every 5-10 years through 67 years of age. However, you may need to be screened more often if early forms of precancerous polyps or small growths are found.  Skin Cancer  Check your skin from head to toe regularly.  Tell your health care provider about any new moles or changes in moles, especially if there is a change in a mole's shape or color.  Also tell your health care provider if you have a mole that is larger than the size of a pencil eraser.  Always use sunscreen. Apply sunscreen liberally and repeatedly throughout the day.  Protect yourself by wearing long sleeves, pants, a wide-brimmed hat, and sunglasses whenever you are outside.  Heart disease, diabetes, and high blood pressure  High blood pressure causes heart disease and increases the risk of stroke. High blood pressure is more likely to develop in: ? People who have blood pressure in the high end of the normal range (130-139/85-89 mm Hg). ? People who are overweight or obese. ? People who are African American.  If you are 26-59 years of age, have your blood pressure checked every 3-5 years. If you are 6 years of age or older, have your blood pressure checked every year. You should have your blood pressure measured twice-once when you are at a hospital or clinic, and once when you are not at a hospital or clinic.  Record the average of the two measurements. To check your blood pressure when you are not at a hospital or clinic, you can use: ? An automated blood pressure machine at a pharmacy. ? A home blood pressure monitor.  If you are between 48 years and 22 years old, ask your health care provider if you should take aspirin to prevent strokes.  Have regular diabetes screenings. This involves taking a blood sample to check your fasting blood sugar level. ? If you are at a normal weight and have a low risk for diabetes, have this test once every three years after 67 years of age. ? If you are overweight and have a high risk for diabetes, consider being tested at a younger age or more often. Preventing infection Hepatitis B  If you have a higher risk for hepatitis B, you should be screened for this virus. You are considered at high risk for hepatitis B if: ? You were born in a country where hepatitis B is common. Ask your health care provider which countries are considered high risk. ? Your parents were born in a high-risk country, and you have not been immunized against hepatitis B (hepatitis B vaccine). ? You have HIV or AIDS. ? You use needles to inject street drugs. ? You live with someone who has hepatitis B. ? You have had sex  with someone who has hepatitis B. ? You get hemodialysis treatment. ? You take certain medicines for conditions, including cancer, organ transplantation, and autoimmune conditions.  Hepatitis C  Blood testing is recommended for: ? Everyone born from [birth year] through 1965. ? Anyone with known risk factors for hepatitis C.  Sexually transmitted infections (STIs)  You should be screened for sexually transmitted infections (STIs) including gonorrhea and chlamydia if: ? You are sexually active and are younger than 67 years of age. ? You are older than 67 years of age and your health care provider tells you that you are at risk for this type of infection. ? Your sexual  activity has changed since you were last screened and you are at an increased risk for chlamydia or gonorrhea. Ask your health care provider if you are at risk.  If you do not have HIV, but are at risk, it may be recommended that you take a prescription medicine daily to prevent HIV infection. This is called pre-exposure prophylaxis (PrEP). You are considered at risk if: ? You are sexually active and do not regularly use condoms or know the HIV status of your partner(s). ? You take drugs by injection. ? You are sexually active with a partner who has HIV.  Talk with your health care provider about whether you are at high risk of being infected with HIV. If you choose to begin PrEP, you should first be tested for HIV. You should then be tested every 3 months for as long as you are taking PrEP. Pregnancy  If you are premenopausal and you may become pregnant, ask your health care provider about preconception counseling.  If you may become pregnant, take 400 to 800 micrograms (mcg) of folic acid every day.  If you want to prevent pregnancy, talk to your health care provider about birth control (contraception). Osteoporosis and menopause  Osteoporosis is a disease in which the bones lose minerals and strength with aging. This can result in serious bone fractures. Your risk for osteoporosis can be identified using a bone density scan.  If you are 81 years of age or older, or if you are at risk for osteoporosis and fractures, ask your health care provider if you should be screened.  Ask your health care provider whether you should take a calcium or vitamin D supplement to lower your risk for osteoporosis.  Menopause may have certain physical symptoms and risks.  Hormone replacement therapy may reduce some of these symptoms and risks. Talk to your health care provider about whether hormone replacement therapy is right for you. Follow these instructions at home:  Schedule regular health, dental,  and eye exams.  Stay current with your immunizations.  Do not use any tobacco products including cigarettes, chewing tobacco, or electronic cigarettes.  If you are pregnant, do not drink alcohol.  If you are breastfeeding, limit how much and how often you drink alcohol.  Limit alcohol intake to no more than 1 drink per day for nonpregnant women. One drink equals 12 ounces of beer, 5 ounces of Lesia Monica, or 1 ounces of hard liquor.  Do not use street drugs.  Do not share needles.  Ask your health care provider for help if you need support or information about quitting drugs.  Tell your health care provider if you often feel depressed.  Tell your health care provider if you have ever been abused or do not feel safe at home. This information is not intended to replace advice given to you  by your health care provider. Make sure you discuss any questions you have with your health care provider. Document Released: 06/22/2011 Document Revised: 05/14/2016 Document Reviewed: 09/10/2015 Elsevier Interactive Patient Education  [employer].

## 2018-09-28 ENCOUNTER — Ambulatory Visit (INDEPENDENT_AMBULATORY_CARE_PROVIDER_SITE_OTHER): Payer: PPO | Admitting: Internal Medicine

## 2018-09-28 ENCOUNTER — Encounter: Payer: Self-pay | Admitting: Internal Medicine

## 2018-09-28 ENCOUNTER — Other Ambulatory Visit (INDEPENDENT_AMBULATORY_CARE_PROVIDER_SITE_OTHER): Payer: PPO

## 2018-09-28 VITALS — BP 144/76 | HR 54 | Temp 98.3°F | Resp 16 | Ht 68.0 in | Wt 138.5 lb

## 2018-09-28 DIAGNOSIS — D513 Other dietary vitamin B12 deficiency anemia: Secondary | ICD-10-CM | POA: Diagnosis not present

## 2018-09-28 DIAGNOSIS — D508 Other iron deficiency anemias: Secondary | ICD-10-CM

## 2018-09-28 DIAGNOSIS — R0609 Other forms of dyspnea: Secondary | ICD-10-CM | POA: Diagnosis not present

## 2018-09-28 DIAGNOSIS — E785 Hyperlipidemia, unspecified: Secondary | ICD-10-CM

## 2018-09-28 DIAGNOSIS — E559 Vitamin D deficiency, unspecified: Secondary | ICD-10-CM | POA: Diagnosis not present

## 2018-09-28 DIAGNOSIS — Z0000 Encounter for general adult medical examination without abnormal findings: Secondary | ICD-10-CM

## 2018-09-28 DIAGNOSIS — I10 Essential (primary) hypertension: Secondary | ICD-10-CM | POA: Diagnosis not present

## 2018-09-28 DIAGNOSIS — Z1231 Encounter for screening mammogram for malignant neoplasm of breast: Secondary | ICD-10-CM

## 2018-09-28 DIAGNOSIS — R001 Bradycardia, unspecified: Secondary | ICD-10-CM | POA: Diagnosis not present

## 2018-09-28 DIAGNOSIS — L309 Dermatitis, unspecified: Secondary | ICD-10-CM

## 2018-09-28 DIAGNOSIS — M8580 Other specified disorders of bone density and structure, unspecified site: Secondary | ICD-10-CM

## 2018-09-28 LAB — URINALYSIS, ROUTINE W REFLEX MICROSCOPIC
BILIRUBIN URINE: NEGATIVE
KETONES UR: NEGATIVE
NITRITE: NEGATIVE
Specific Gravity, Urine: 1.02 (ref 1.000–1.030)
TOTAL PROTEIN, URINE-UPE24: NEGATIVE
URINE GLUCOSE: NEGATIVE
Urobilinogen, UA: 0.2 (ref 0.0–1.0)
pH: 6.5 (ref 5.0–8.0)

## 2018-09-28 LAB — COMPREHENSIVE METABOLIC PANEL
ALBUMIN: 4 g/dL (ref 3.5–5.2)
ALT: 8 U/L (ref 0–35)
AST: 20 U/L (ref 0–37)
Alkaline Phosphatase: 49 U/L (ref 39–117)
BUN: 23 mg/dL (ref 6–23)
CHLORIDE: 100 meq/L (ref 96–112)
CO2: 31 meq/L (ref 19–32)
Calcium: 9.4 mg/dL (ref 8.4–10.5)
Creatinine, Ser: 0.86 mg/dL (ref 0.40–1.20)
GFR: 84.63 mL/min (ref >60.00)
GLUCOSE: 96 mg/dL (ref 70–99)
Potassium: 3.5 mEq/L (ref 3.5–5.1)
SODIUM: 138 meq/L (ref 135–145)
Total Bilirubin: 0.7 mg/dL (ref 0.2–1.2)
Total Protein: 7.5 g/dL (ref 6.0–8.3)

## 2018-09-28 LAB — CBC WITH DIFFERENTIAL/PLATELET
BASOS ABS: 0 10*3/uL (ref 0.0–0.1)
BASOS PCT: 0.6 % (ref 0.0–3.0)
EOS ABS: 0.1 10*3/uL (ref 0.0–0.7)
Eosinophils Relative: 2.2 % (ref 0.0–5.0)
HEMATOCRIT: 36 % (ref 36.0–46.0)
Hemoglobin: 11.8 g/dL — ABNORMAL LOW (ref 12.0–15.0)
LYMPHS ABS: 0.9 10*3/uL (ref 0.7–4.0)
LYMPHS PCT: 25.2 % (ref 12.0–46.0)
MCHC: 32.9 g/dL (ref 30.0–36.0)
MCV: 82.2 fl (ref 78.0–100.0)
MONO ABS: 0.4 10*3/uL (ref 0.1–1.0)
Monocytes Relative: 11.3 % (ref 3.0–12.0)
NEUTROS PCT: 60.7 % (ref 43.0–77.0)
Neutro Abs: 2.1 10*3/uL (ref 1.4–7.7)
PLATELETS: 198 10*3/uL (ref 150.0–400.0)
RBC: 4.38 Mil/uL (ref 3.87–5.11)
RDW: 13.1 % (ref 11.5–15.5)
WBC: 3.4 10*3/uL — ABNORMAL LOW (ref 4.0–10.5)

## 2018-09-28 LAB — LIPID PANEL
Cholesterol: 175 mg/dL (ref 0–200)
HDL: 64.9 mg/dL (ref >39.00)
LDL Cholesterol: 102 mg/dL — ABNORMAL HIGH (ref 0–99)
NonHDL: 109.61
Total CHOL/HDL Ratio: 3
Triglycerides: 38 mg/dL (ref 0.0–149.0)
VLDL: 7.6 mg/dL (ref 0.0–40.0)

## 2018-09-28 LAB — VITAMIN B12: VITAMIN B 12: 400 pg/mL (ref 211–911)

## 2018-09-28 LAB — VITAMIN D 25 HYDROXY (VIT D DEFICIENCY, FRACTURES): VITD: 29.22 ng/mL — ABNORMAL LOW (ref 30.00–100.00)

## 2018-09-28 LAB — TSH: TSH: 0.76 u[IU]/mL (ref 0.35–4.50)

## 2018-09-28 LAB — IBC PANEL
IRON: 70 ug/dL (ref 42–145)
Saturation Ratios: 21.2 % (ref 20.0–50.0)
Transferrin: 236 mg/dL (ref 212.0–360.0)

## 2018-09-28 LAB — FERRITIN: FERRITIN: 277.4 ng/mL (ref 10.0–291.0)

## 2018-09-28 LAB — FOLATE: Folate: 10.6 ng/mL (ref >5.9)

## 2018-09-28 MED ORDER — VITAMIN D3 50 MCG (2000 UT) PO CAPS: 4000.0000 [IU] | ORAL_CAPSULE | Freq: Every day | ORAL | 1 refills | Status: DC

## 2018-09-28 MED ORDER — CRISABOROLE 2 % EX OINT: 1.0000 | TOPICAL_OINTMENT | Freq: Two times a day (BID) | CUTANEOUS | 3 refills | Status: DC

## 2018-09-28 MED ORDER — ALENDRONATE SODIUM 70 MG PO TABS: 70.0000 mg | ORAL_TABLET | ORAL | 11 refills | Status: DC

## 2018-09-28 NOTE — Patient Instructions (Signed)

## 2018-09-28 NOTE — Progress Notes (Signed)
Subjective:  Patient ID: Susan Mccarty, female    DOB: September 29, 1951  Age: 67 y.o. MRN: [id number]  CC: Hypertension; Hyperlipidemia; Anemia; Annual Exam; and Rash   HPI [name] presents for a CPX.  She complains of a 46-month history of DOE and presyncope.  She denies chest pain, palpitations, edema, or fatigue.  She also complains of an itchy rash over her left lower extremity just proximal to the ankle.  She has tried to treat it though unsuccessfully with over-the-counter remedies.  Past Medical History:  Diagnosis Date  . Bradycardia 08/17/2017  . Bradycardia, sinus 06/02/2016  . Deficiency anemia 08/17/2017  . Essential hypertension 06/02/2016  . Fibroid, uterine 09/17/2016  . Glaucoma   . Hyperlipidemia with target LDL less than 130 05/17/2014   Framingham risk score is 4%  . Hypokalemia 05/11/2017  . Iron deficiency anemia secondary to inadequate dietary iron intake 08/19/2017  . Loss of weight 09/29/2016  . Osteoporosis 01/2018   T score -2.5  . Other dietary vitamin B12 deficiency anemia 08/19/2017  . Postmenopausal postcoital bleeding 09/29/2016  . Routine health maintenance 09/26/2011  . Visit for screening mammogram 05/27/2015  . Vitamin D deficiency   . Weight loss, non-intentional 09/17/2016   Past Surgical History:  Procedure Laterality Date  . BREAST CYST EXCISION  2006   right     reports that she has never smoked. She has never used smokeless tobacco. She reports that she drinks alcohol. She reports that she does not use drugs. family history includes Alcohol abuse in her brother and mother; Breast cancer in her cousin; Diabetes in her mother and sister; Hypertension in her father and sister. No Known Allergies  Outpatient Medications Prior to Visit  Medication Sig Dispense Refill  . brimonidine (ALPHAGAN) 0.2 % ophthalmic solution Place 1 drop into both eyes 2 (two) times daily.     . Brinzolamide-Brimonidine (SIMBRINZA) 1-0.2 % SUSP Apply to eye.    .  chlorthalidone (HYGROTON) 25 MG tablet Take 1 tablet (25 mg total) by mouth daily. 30 tablet 0  . cyanocobalamin 2000 MCG tablet Take 1 tablet (2,000 mcg total) by mouth daily. 90 tablet 3  . ferrous sulfate 220 (44 Fe) MG/5ML solution Take 5 mLs (220 mg total) by mouth 2 (two) times daily with a meal. 473 mL 5  . Iron-Vitamins (GERITOL) LIQD Take by mouth daily.    . potassium chloride SA (K-DUR,KLOR-CON) 20 MEQ tablet Take 1 tablet (20 mEq total) by mouth 2 (two) times daily. 180 tablet 1  . vitamin A 8000 UNIT capsule Take 8,000 Units by mouth daily.    . vitamin C (ASCORBIC ACID) 500 MG tablet Take 500 mg by mouth daily.    . vitamin E 400 UNIT capsule Take 400 Units by mouth daily.    Marland Kitchen alendronate (FOSAMAX) 70 MG tablet Take 1 tablet (70 mg total) by mouth every 7 (seven) days. Take with a full glass of water on an empty stomach. 4 tablet 11  . Cholecalciferol (VITAMIN D3) 2000 units capsule Take 2,000 Units by mouth daily.    Marland Kitchen latanoprost (XALATAN) 0.005 % ophthalmic solution Place 1 drop into both eyes at bedtime.      No facility-administered medications prior to visit.     ROS Review of Systems  Constitutional: Negative for diaphoresis and fatigue.  HENT: Negative.   Eyes: Negative.   Respiratory: Positive for shortness of breath.   Cardiovascular: Negative for chest pain, palpitations and leg swelling.  Gastrointestinal: Negative.  Negative for abdominal pain, constipation, diarrhea, nausea and vomiting.  Endocrine: Negative.   Genitourinary: Negative.  Negative for decreased urine volume, difficulty urinating, dysuria, hematuria, vaginal bleeding and vaginal discharge.  Musculoskeletal: Negative.  Negative for arthralgias, back pain, myalgias and neck pain.  Skin: Positive for rash. Negative for color change.  Neurological: Positive for light-headedness. Negative for dizziness and weakness.  Hematological: Negative for adenopathy. Does not bruise/bleed easily.    Psychiatric/Behavioral: Negative.     Objective:  BP (!) 144/76 (BP Location: Left Arm, Patient Position: Sitting, Cuff Size: Normal)   Pulse (!) 54   Temp 98.3 F (36.8 C) (Oral)   Resp 16   Ht 5\' 8"  (1.727 m)   Wt 138 lb 8 oz (62.8 kg)   SpO2 99%   BMI 21.06 kg/m   BP Readings from Last 3 Encounters:  09/28/18 (!) 144/76  09/07/18 (!) 108/58  03/16/18 120/74    Wt Readings from Last 3 Encounters:  09/28/18 138 lb 8 oz (62.8 kg)  09/07/18 140 lb (63.5 kg)  11/02/17 138 lb (62.6 kg)    Physical Exam  Constitutional: She is oriented to person, place, and time. No distress.  HENT:  Mouth/Throat: Oropharynx is clear and moist. No oropharyngeal exudate.  Eyes: Conjunctivae are normal. No scleral icterus.  Neck: Normal range of motion. Neck supple. No JVD present. No thyromegaly present.  Cardiovascular: Regular rhythm. Bradycardia present. Exam reveals no gallop.  No murmur heard. EKG --  Sinus  Bradycardia  WITHIN NORMAL LIMITS - no change from the prior EKG  Pulmonary/Chest: Effort normal and breath sounds normal. No respiratory distress. She has no wheezes. She has no rales.  Abdominal: Soft. Normal appearance and bowel sounds are normal. She exhibits no mass. There is no hepatosplenomegaly. There is no tenderness.  Musculoskeletal: Normal range of motion. She exhibits no edema, tenderness or deformity.  Lymphadenopathy:    She has no cervical adenopathy.  Neurological: She is alert and oriented to person, place, and time.  Skin: Skin is warm and dry. Rash noted. No purpura noted. Rash is macular. Rash is not vesicular and not urticarial. She is not diaphoretic. No erythema.  Medial aspect, left lower extremity, just proximal to the medial malleolus there is a slightly hyperpigmented scaly macule.  Psychiatric: She has a normal mood and affect. Her behavior is normal. Thought content normal.  Vitals reviewed.   Lab Results  Component Value Date   WBC 3.4 (L)  09/28/2018   HGB 11.8 (L) 09/28/2018   HCT 36.0 09/28/2018   PLT 198.0 09/28/2018   GLUCOSE 96 09/28/2018   CHOL 175 09/28/2018   TRIG 38.0 09/28/2018   HDL 64.90 09/28/2018   LDLCALC 102 (H) 09/28/2018   ALT 8 09/28/2018   AST 20 09/28/2018   NA 138 09/28/2018   K 3.5 09/28/2018   CL 100 09/28/2018   CREATININE 0.86 09/28/2018   BUN 23 09/28/2018   CO2 31 09/28/2018   TSH 0.76 09/28/2018    No results found.  Assessment & Plan:   Maeryn was seen today for hypertension, hyperlipidemia, anemia, annual exam and rash.  Diagnoses and all orders for this visit:  Essential hypertension- Her blood pressure is well controlled.  Electrolytes and renal function are normal. -     Comprehensive metabolic panel; Future -     VITAMIN D 25 Hydroxy (Vit-D Deficiency, Fractures); Future -     Urinalysis, Routine w reflex microscopic; Future  Iron deficiency anemia  secondary to inadequate dietary iron intake- Improvement noted. -     CBC with Differential/Platelet; Future -     IBC panel; Future -     Ferritin; Future  Other dietary vitamin B12 deficiency anemia- Improvement noted. -     CBC with Differential/Platelet; Future -     Vitamin B12; Future -     Folate; Future  Osteopenia, unspecified location -     Cholecalciferol (VITAMIN D3) 2000 units capsule; Take 2 capsules (4,000 Units total) by mouth daily.  Hyperlipidemia with target LDL less than 130- Her ASCVD risk score is less than 15% so I do not recommend a statin for CV risk reduction. -     Lipid panel; Future -     Comprehensive metabolic panel; Future -     TSH; Future  Bradycardia, sinus- I have asked her to undergo an exercise tolerance test to see if the bradycardia is causing her exercise related symptoms. -     EXERCISE TOLERANCE TEST (ETT); Future  Routine health maintenance  Eczema, unspecified type -     Crisaborole (EUCRISA) 2 % OINT; Apply 1 Act topically 2 (two) times daily.  DOE (dyspnea on  exertion)-I have asked her to undergo an exercise tolerance test to screen for ischemia. -     EKG 12-Lead -     EXERCISE TOLERANCE TEST (ETT); Future  Visit for screening mammogram -     MM DIGITAL SCREENING BILATERAL; Future  Vitamin D deficiency -     Cholecalciferol (VITAMIN D3) 2000 units capsule; Take 2 capsules (4,000 Units total) by mouth daily.  Routine general medical examination at a health care facility  Other orders -     alendronate (FOSAMAX) 70 MG tablet; Take 1 tablet (70 mg total) by mouth every 7 (seven) days. Take with a full glass of water on an empty stomach.   I have discontinued Maritta Kief. Donaghue's latanoprost. I have also changed her Vitamin D3. Additionally, I am having her start on Crisaborole. Lastly, I am having her maintain her vitamin E, vitamin A, vitamin C, Geritol, brimonidine, potassium chloride SA, cyanocobalamin, ferrous sulfate, Brinzolamide-Brimonidine, chlorthalidone, and alendronate.  Meds ordered this encounter  Medications  . alendronate (FOSAMAX) 70 MG tablet    Sig: Take 1 tablet (70 mg total) by mouth every 7 (seven) days. Take with a full glass of water on an empty stomach.    Dispense:  4 tablet    Refill:  11  . Crisaborole (EUCRISA) 2 % OINT    Sig: Apply 1 Act topically 2 (two) times daily.    Dispense:  60 g    Refill:  3  . Cholecalciferol (VITAMIN D3) 2000 units capsule    Sig: Take 2 capsules (4,000 Units total) by mouth daily.    Dispense:  180 capsule    Refill:  1   See AVS for instructions about healthy living and anticipatory guidance.  Follow-up: Return in about 3 months (around 12/29/2018).  Scarlette Calico, MD

## 2018-09-29 NOTE — Assessment & Plan Note (Signed)

## 2018-10-19 ENCOUNTER — Other Ambulatory Visit: Payer: Self-pay | Admitting: Internal Medicine

## 2018-10-19 DIAGNOSIS — I10 Essential (primary) hypertension: Secondary | ICD-10-CM

## 2018-10-19 MED ORDER — CHLORTHALIDONE 25 MG PO TABS: 25.0000 mg | ORAL_TABLET | Freq: Every day | ORAL | 1 refills | Status: DC

## 2018-10-27 ENCOUNTER — Ambulatory Visit: Payer: PPO

## 2018-11-02 DIAGNOSIS — H401123 Primary open-angle glaucoma, left eye, severe stage: Secondary | ICD-10-CM | POA: Diagnosis not present

## 2018-11-02 DIAGNOSIS — H25813 Combined forms of age-related cataract, bilateral: Secondary | ICD-10-CM | POA: Diagnosis not present

## 2018-11-15 ENCOUNTER — Ambulatory Visit (INDEPENDENT_AMBULATORY_CARE_PROVIDER_SITE_OTHER): Payer: PPO

## 2018-11-15 DIAGNOSIS — R001 Bradycardia, unspecified: Secondary | ICD-10-CM

## 2018-11-15 DIAGNOSIS — R0609 Other forms of dyspnea: Secondary | ICD-10-CM | POA: Diagnosis not present

## 2018-11-15 LAB — EXERCISE TOLERANCE TEST
CSEPED: 6 min
CSEPEDS: 30 s
CSEPHR: 90 %
Estimated workload: 7.7 METS
MPHR: 153 {beats}/min
Peak HR: 139 {beats}/min
RPE: 17
Rest HR: 74 {beats}/min

## 2018-12-31 DIAGNOSIS — Z1231 Encounter for screening mammogram for malignant neoplasm of breast: Secondary | ICD-10-CM | POA: Diagnosis not present

## 2018-12-31 LAB — HM MAMMOGRAPHY

## 2019-01-04 ENCOUNTER — Encounter: Payer: Self-pay | Admitting: Internal Medicine

## 2019-01-23 ENCOUNTER — Telehealth: Payer: Self-pay | Admitting: Internal Medicine

## 2019-01-23 NOTE — Telephone Encounter (Signed)
Copied from [place]. Topic: General - Other >> Jan 23, 2019 10:47 AM Yvette Rack wrote: Reason for CRM: pt calling about stress test results

## 2019-01-23 NOTE — Telephone Encounter (Signed)
Tried contacting pt, unable to LVM due to mailbox not being set up.

## 2019-01-25 ENCOUNTER — Ambulatory Visit: Payer: PPO | Admitting: Internal Medicine

## 2019-01-25 NOTE — Telephone Encounter (Signed)
Pt contacted and informed of stress test results. Pt encouraged to keep her appt.

## 2019-02-07 ENCOUNTER — Encounter: Payer: Self-pay | Admitting: Internal Medicine

## 2019-02-07 ENCOUNTER — Ambulatory Visit (INDEPENDENT_AMBULATORY_CARE_PROVIDER_SITE_OTHER): Payer: PPO | Admitting: Internal Medicine

## 2019-02-07 ENCOUNTER — Other Ambulatory Visit (INDEPENDENT_AMBULATORY_CARE_PROVIDER_SITE_OTHER): Payer: PPO

## 2019-02-07 VITALS — BP 136/78 | HR 59 | Temp 98.2°F | Resp 16 | Ht 68.0 in | Wt 140.0 lb

## 2019-02-07 DIAGNOSIS — D513 Other dietary vitamin B12 deficiency anemia: Secondary | ICD-10-CM

## 2019-02-07 DIAGNOSIS — D508 Other iron deficiency anemias: Secondary | ICD-10-CM | POA: Diagnosis not present

## 2019-02-07 DIAGNOSIS — I10 Essential (primary) hypertension: Secondary | ICD-10-CM

## 2019-02-07 DIAGNOSIS — R001 Bradycardia, unspecified: Secondary | ICD-10-CM | POA: Diagnosis not present

## 2019-02-07 LAB — FERRITIN: Ferritin: 254.4 ng/mL (ref 10.0–291.0)

## 2019-02-07 LAB — CBC WITH DIFFERENTIAL/PLATELET
Basophils Absolute: 0 10*3/uL (ref 0.0–0.1)
Basophils Relative: 1.1 % (ref 0.0–3.0)
Eosinophils Absolute: 0.1 10*3/uL (ref 0.0–0.7)
Eosinophils Relative: 2.9 % (ref 0.0–5.0)
HCT: 38.6 % (ref 36.0–46.0)
Hemoglobin: 12.6 g/dL (ref 12.0–15.0)
Lymphocytes Relative: 28.8 % (ref 12.0–46.0)
Lymphs Abs: 0.9 10*3/uL (ref 0.7–4.0)
MCHC: 32.7 g/dL (ref 30.0–36.0)
MCV: 82.9 fl (ref 78.0–100.0)
MONO ABS: 0.5 10*3/uL (ref 0.1–1.0)
Monocytes Relative: 15.3 % — ABNORMAL HIGH (ref 3.0–12.0)
Neutro Abs: 1.6 10*3/uL (ref 1.4–7.7)
Neutrophils Relative %: 51.9 % (ref 43.0–77.0)
Platelets: 210 10*3/uL (ref 150.0–400.0)
RBC: 4.66 Mil/uL (ref 3.87–5.11)
RDW: 13.2 % (ref 11.5–15.5)
WBC: 3 10*3/uL — ABNORMAL LOW (ref 4.0–10.5)

## 2019-02-07 LAB — FOLATE: Folate: 16.2 ng/mL (ref >5.9)

## 2019-02-07 LAB — BASIC METABOLIC PANEL
BUN: 19 mg/dL (ref 6–23)
CO2: 30 mEq/L (ref 19–32)
Calcium: 9.6 mg/dL (ref 8.4–10.5)
Chloride: 101 mEq/L (ref 96–112)
Creatinine, Ser: 0.92 mg/dL (ref 0.40–1.20)
GFR: 73.58 mL/min (ref >60.00)
Glucose, Bld: 95 mg/dL (ref 70–99)
Potassium: 3.7 mEq/L (ref 3.5–5.1)
Sodium: 139 mEq/L (ref 135–145)

## 2019-02-07 LAB — IBC PANEL
Iron: 80 ug/dL (ref 42–145)
Saturation Ratios: 23.5 % (ref 20.0–50.0)
Transferrin: 243 mg/dL (ref 212.0–360.0)

## 2019-02-07 LAB — VITAMIN B12: VITAMIN B 12: 502 pg/mL (ref 211–911)

## 2019-02-07 NOTE — Progress Notes (Signed)
Subjective:  Patient ID: Susan Mccarty, female    DOB: 10-04-1951  Age: 68 y.o. MRN: [id number]  CC: Hypertension and Anemia   HPI REATHA SUR presents for f/up - She continues to experience shortness of breath that she says occurs after she has been exercising.  She remains concerned about her low heart rate.  She denies any recent episodes of near syncope, dizziness, or lightheadedness.  She wants to see her cardiologist again.  Outpatient Medications Prior to Visit  Medication Sig Dispense Refill  . alendronate (FOSAMAX) 70 MG tablet Take 1 tablet (70 mg total) by mouth every 7 (seven) days. Take with a full glass of water on an empty stomach. 4 tablet 11  . brimonidine (ALPHAGAN) 0.2 % ophthalmic solution Place 1 drop into both eyes 2 (two) times daily.     . Brinzolamide-Brimonidine (SIMBRINZA) 1-0.2 % SUSP Apply to eye.    . chlorthalidone (HYGROTON) 25 MG tablet Take 1 tablet (25 mg total) by mouth daily. 90 tablet 1  . Cholecalciferol (VITAMIN D3) 2000 units capsule Take 2 capsules (4,000 Units total) by mouth daily. 180 capsule 1  . Crisaborole (EUCRISA) 2 % OINT Apply 1 Act topically 2 (two) times daily. 60 g 3  . cyanocobalamin 2000 MCG tablet Take 1 tablet (2,000 mcg total) by mouth daily. 90 tablet 3  . ferrous sulfate 220 (44 Fe) MG/5ML solution Take 5 mLs (220 mg total) by mouth 2 (two) times daily with a meal. 473 mL 5  . Iron-Vitamins (GERITOL) LIQD Take by mouth daily.    . potassium chloride SA (K-DUR,KLOR-CON) 20 MEQ tablet Take 1 tablet (20 mEq total) by mouth 2 (two) times daily. 180 tablet 1  . vitamin A 8000 UNIT capsule Take 8,000 Units by mouth daily.    . vitamin C (ASCORBIC ACID) 500 MG tablet Take 500 mg by mouth daily.    . vitamin E 400 UNIT capsule Take 400 Units by mouth daily.    . chlorthalidone (HYGROTON) 25 MG tablet TAKE 1 TABLET BY MOUTH EVERY DAY MUST KEEP APPOINTMENT 90 tablet 0   No facility-administered medications prior to visit.      ROS Review of Systems  Constitutional: Negative for diaphoresis, fatigue and unexpected weight change.  HENT: Negative.   Eyes: Negative for visual disturbance.  Respiratory: Positive for shortness of breath. Negative for cough, chest tightness and wheezing.   Cardiovascular: Negative for chest pain, palpitations and leg swelling.  Gastrointestinal: Negative for abdominal pain, constipation, diarrhea, nausea and vomiting.  Genitourinary: Negative.  Negative for difficulty urinating.  Musculoskeletal: Negative.  Negative for arthralgias and myalgias.  Skin: Negative.  Negative for color change and pallor.  Neurological: Negative.  Negative for dizziness, weakness, light-headedness, numbness and headaches.  Hematological: Negative for adenopathy. Does not bruise/bleed easily.  Psychiatric/Behavioral: Negative.     Objective:  BP 136/78 (BP Location: Left Arm, Patient Position: Sitting, Cuff Size: Normal)   Pulse (!) 59   Temp 98.2 F (36.8 C) (Oral)   Resp 16   Ht 5\' 8"  (1.727 m)   Wt 140 lb (63.5 kg)   SpO2 98%   BMI 21.29 kg/m   BP Readings from Last 3 Encounters:  02/07/19 136/78  09/28/18 (!) 144/76  09/07/18 (!) 108/58    Wt Readings from Last 3 Encounters:  02/07/19 140 lb (63.5 kg)  11/15/18 138 lb (62.6 kg)  09/28/18 138 lb 8 oz (62.8 kg)    Physical Exam Vitals signs reviewed.  Constitutional:      Appearance: She is not ill-appearing or diaphoretic.  HENT:     Nose: Nose normal. No congestion or rhinorrhea.     Mouth/Throat:     Mouth: Mucous membranes are moist.     Pharynx: Oropharynx is clear. No oropharyngeal exudate or posterior oropharyngeal erythema.  Eyes:     General: No scleral icterus.    Conjunctiva/sclera: Conjunctivae normal.  Neck:     Musculoskeletal: Normal range of motion and neck supple. No muscular tenderness.  Cardiovascular:     Rate and Rhythm: Regular rhythm. Bradycardia present.     Heart sounds: No murmur. No gallop.    Pulmonary:     Effort: Pulmonary effort is normal. No respiratory distress.     Breath sounds: Normal breath sounds. No wheezing or rales.  Abdominal:     General: Bowel sounds are normal.     Palpations: There is no hepatomegaly, splenomegaly or mass.     Tenderness: There is no abdominal tenderness.  Musculoskeletal: Normal range of motion.        General: No swelling.     Right lower leg: No edema.     Left lower leg: No edema.  Skin:    General: Skin is warm and dry.  Neurological:     General: No focal deficit present.     Mental Status: She is oriented to person, place, and time. Mental status is at baseline.     Lab Results  Component Value Date   WBC 3.0 (L) 02/07/2019   HGB 12.6 02/07/2019   HCT 38.6 02/07/2019   PLT 210.0 02/07/2019   GLUCOSE 95 02/07/2019   CHOL 175 09/28/2018   TRIG 38.0 09/28/2018   HDL 64.90 09/28/2018   LDLCALC 102 (H) 09/28/2018   ALT 8 09/28/2018   AST 20 09/28/2018   NA 139 02/07/2019   K 3.7 02/07/2019   CL 101 02/07/2019   CREATININE 0.92 02/07/2019   BUN 19 02/07/2019   CO2 30 02/07/2019   TSH 0.76 09/28/2018    No results found.  Assessment & Plan:   Jenicka was seen today for hypertension and anemia.  Diagnoses and all orders for this visit:  Iron deficiency anemia secondary to inadequate dietary iron intake- The anemia has resolved and her iron level is normal.  Will continue the current iron supplement. -     CBC with Differential/Platelet; Future -     IBC panel; Future -     Ferritin; Future  Other dietary vitamin B12 deficiency anemia- The anemia has resolved.  Her B12 and folate levels are normal.  Will continue the current B12 supplement. -     CBC with Differential/Platelet; Future -     Vitamin B12; Future -     Folate; Future  Essential hypertension- Her blood pressure is adequately well controlled.  Electrolytes and renal function are normal. -     Basic metabolic panel; Future  Bradycardia, sinus -      Ambulatory referral to Cardiology   I am having Renato Battles maintain her vitamin E, vitamin A, vitamin C, Geritol, brimonidine, potassium chloride SA, cyanocobalamin, ferrous sulfate, Brinzolamide-Brimonidine, alendronate, Crisaborole, Vitamin D3, and chlorthalidone.  No orders of the defined types were placed in this encounter.    Follow-up: Return in about 4 months (around 06/08/2019).  Scarlette Calico, MD

## 2019-02-07 NOTE — Patient Instructions (Signed)
Anemia  Anemia is a condition in which you do not have enough red blood cells or hemoglobin. Hemoglobin is a substance in red blood cells that carries oxygen. When you do not have enough red blood cells or hemoglobin (are anemic), your body cannot get enough oxygen and your organs may not work properly. As a result, you may feel very tired or have other problems. What are the causes? Common causes of anemia include:  Excessive bleeding. Anemia can be caused by excessive bleeding inside or outside the body, including bleeding from the intestine or from periods in women.  Poor nutrition.  Long-lasting (chronic) kidney, thyroid, and liver disease.  Bone marrow disorders.  Cancer and treatments for cancer.  HIV (human immunodeficiency virus) and AIDS (acquired immunodeficiency syndrome).  Treatments for HIV and AIDS.  Spleen problems.  Blood disorders.  Infections, medicines, and autoimmune disorders that destroy red blood cells. What are the signs or symptoms? Symptoms of this condition include:  Minor weakness.  Dizziness.  Headache.  Feeling heartbeats that are irregular or faster than normal (palpitations).  Shortness of breath, especially with exercise.  Paleness.  Cold sensitivity.  Indigestion.  Nausea.  Difficulty sleeping.  Difficulty concentrating. Symptoms may occur suddenly or develop slowly. If your anemia is mild, you may not have symptoms. How is this diagnosed? This condition is diagnosed based on:  Blood tests.  Your medical history.  A physical exam.  Bone marrow biopsy. Your health care provider may also check your stool (feces) for blood and may do additional testing to look for the cause of your bleeding. You may also have other tests, including:  Imaging tests, such as a CT scan or MRI.  Endoscopy.  Colonoscopy. How is this treated? Treatment for this condition depends on the cause. If you continue to lose a lot of blood, you may  need to be treated at a hospital. Treatment may include:  Taking supplements of iron, vitamin S31, or folic acid.  Taking a hormone medicine (erythropoietin) that can help to stimulate red blood cell growth.  Having a blood transfusion. This may be needed if you lose a lot of blood.  Making changes to your diet.  Having surgery to remove your spleen. Follow these instructions at home:  Take over-the-counter and prescription medicines only as told by your health care provider.  Take supplements only as told by your health care provider.  Follow any diet instructions that you were given.  Keep all follow-up visits as told by your health care provider. This is important. Contact a health care provider if:  You develop new bleeding anywhere in the body. Get help right away if:  You are very weak.  You are short of breath.  You have pain in your abdomen or chest.  You are dizzy or feel faint.  You have trouble concentrating.  You have bloody or black, tarry stools.  You vomit repeatedly or you vomit up blood. Summary  Anemia is a condition in which you do not have enough red blood cells or enough of a substance in your red blood cells that carries oxygen (hemoglobin).  Symptoms may occur suddenly or develop slowly.  If your anemia is mild, you may not have symptoms.  This condition is diagnosed with blood tests as well as a medical history and physical exam. Other tests may be needed.  Treatment for this condition depends on the cause of the anemia. This information is not intended to replace advice given to you by  your health care provider. Make sure you discuss any questions you have with your health care provider. Document Released: 01/14/2005 Document Revised: 01/08/2017 Document Reviewed: 01/08/2017 Elsevier Interactive Patient Education  2019 [employer].

## 2019-03-02 ENCOUNTER — Other Ambulatory Visit: Payer: Self-pay | Admitting: Internal Medicine

## 2019-03-02 ENCOUNTER — Encounter: Payer: Self-pay | Admitting: Physician Assistant

## 2019-03-02 DIAGNOSIS — I10 Essential (primary) hypertension: Secondary | ICD-10-CM

## 2019-03-02 MED ORDER — CHLORTHALIDONE 25 MG PO TABS: 25.0000 mg | ORAL_TABLET | Freq: Every day | ORAL | 1 refills | Status: DC

## 2019-03-02 NOTE — Telephone Encounter (Signed)
"  Medication: chlorthalidone (HYGROTON) 25 MG tablet (hygroton went from $10 to $30 and wants to know if pentoxisylline or digox is ok to take instead?) 2 pills left of hygroton"   Susan Mccarty, [address] [place] [address] Phone: [phone] Fax: [phone]

## 2019-03-02 NOTE — Telephone Encounter (Signed)
Copied from [place] [id number]. Topic: Quick Communication - Rx Refill/Question >> Mar 02, 2019  3:14 PM Waylan Rocher, [place] L wrote: Medication: chlorthalidone (HYGROTON) 25 MG tablet (hygroton went from $10 to $30 and wants to know if pentoxisylline or digox is ok to take instead?) 2 pills left of hygroton  Has the patient contacted their pharmacy? Yes.   (Agent: If no, request that the patient contact the pharmacy for the refill.) (Agent: If yes, when and what did the pharmacy advise?)  Preferred Pharmacy (with phone number or street name): [facility] [place], [place] [address] [place] [address] Phone: [phone] Fax: [phone]  Agent: Please be advised that RX refills may take up to 3 business days. We ask that you follow-up with your pharmacy.

## 2019-03-08 ENCOUNTER — Telehealth: Payer: Self-pay | Admitting: Physician Assistant

## 2019-03-08 NOTE — Progress Notes (Deleted)
Cardiology Office Note    Date:  03/08/2019   ID:  Susan, Mccarty 04-03-1951, MRN [id number]  PCP:  Janith Lima, MD  Cardiologist: No primary care provider on file. EPS: None  No chief complaint on file.   History of Present Illness:  Susan Mccarty is a 68 y.o. female who I saw as new patient 08/26/17 for bradycardia with heart rates down to 40 bpm. She was asymptomatic. She exercises regularly and has some dyspnea on exertion when exercising in the heat but otherwise she has no symptoms. The only medication that could be lowering her heart rate was timolol for glaucoma and we asked her ophthalmologist to change this medication. Patient also had hypertension controlled with chlorthalidone and HLD diet controlled. 2-D echo 09/07/17 showed normal LVEF 55-60% with grade 1 DD mild TR and mildly elevated pulmonary pressure.  Patient's heart rate came up to the 50s after atenolol stopped.  2D echo showed normal LVEF with mild diastolic dysfunction and mildly elevated pulmonary pressures.  I last saw the patient 09/14/2017.  Patient is referred back by Dr. Scarlette Calico because of recurrent bradycardia.  Patient had a GXT 11/15/2018 which time she had hypertensive response to exercise and no ST segment deviation noted.  She had fair exercise tolerance going 6 minutes 30 seconds attaining 85% of her maximum heart rate 5.3 minutes.  It was 74 139, peak blood pressure 227/81.  Patient most recently saw Dr. Ronnald Ramp 02/07/2019 at which time her pulse was 59.  Labs 09/2018 TSH normal, LDL 102 triglycerides 38.  Labs stable 01/2019.  I spoke with patient 03/08/2019 and she actually says she wants to be seen for shortness of breath with exertion.  She said she has to stop exercising after 10 minutes to catch her breath and then she can start back and go the full 20 minutes.  She says her eyedrops states that this could cause her to be short of breath and is wondering if that is what is causing it.  She is  canceling this appointment because of the coronavirus and she prefers to wait.    Past Medical History:  Diagnosis Date  . Bradycardia 08/17/2017  . Bradycardia, sinus 06/02/2016  . Deficiency anemia 08/17/2017  . Essential hypertension 06/02/2016  . Fibroid, uterine 09/17/2016  . Glaucoma   . Hyperlipidemia with target LDL less than 130 05/17/2014   Framingham risk score is 4%  . Hypokalemia 05/11/2017  . Iron deficiency anemia secondary to inadequate dietary iron intake 08/19/2017  . Loss of weight 09/29/2016  . Osteoporosis 01/2018   T score -2.5  . Other dietary vitamin B12 deficiency anemia 08/19/2017  . Postmenopausal postcoital bleeding 09/29/2016  . Routine health maintenance 09/26/2011  . Visit for screening mammogram 05/27/2015  . Vitamin D deficiency   . Weight loss, non-intentional 09/17/2016    Past Surgical History:  Procedure Laterality Date  . BREAST CYST EXCISION  2006   right     Current Medications: No outpatient medications have been marked as taking for the 03/14/19 encounter (Appointment) with Susan Burn, PA-C.     Allergies:   Patient has no known allergies.   Social History   Socioeconomic History  . Marital status: Single    Spouse name: Not on file  . Number of children: 0  . Years of education: 13  . Highest education level: Not on file  Occupational History  . Occupation: Education officer, environmental: Water quality scientist  Social Needs  . Financial resource strain: Not hard at all  . Food insecurity:    Worry: Never true    Inability: Never true  . Transportation needs:    Medical: No    Non-medical: No  Tobacco Use  . Smoking status: Never Smoker  . Smokeless tobacco: Never Used  Substance and Sexual Activity  . Alcohol use: Yes    Alcohol/week: 0.0 standard drinks    Comment: rare use  . Drug use: No  . Sexual activity: Yes    Partners: Male    Comment: 1st intercourse 68 yo-Fewer than 5 partners  Lifestyle  . Physical activity:     Days per week: 2 days    Minutes per session: 40 min  . Stress: Not at all  Relationships  . Social connections:    Talks on phone: Not on file    Gets together: Not on file    Attends religious service: Not on file    Active member of club or organization: Not on file    Attends meetings of clubs or organizations: Not on file    Relationship status: Not on file  Other Topics Concern  . Not on file  Social History Narrative   HSG, no college. Single - in a relationship. No children. 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     Family History:  The patient's ***family history includes Alcohol abuse in her brother and mother; Breast cancer in her cousin; Diabetes in her mother and sister; Hypertension in her father and sister.   ROS:   Please see the history of present illness.    ROS All other systems reviewed and are negative.   PHYSICAL EXAM:   VS:  There were no vitals taken for this visit.  Physical Exam  GEN: Well nourished, well developed, in no acute distress  HEENT: normal  Neck: no JVD, carotid bruits, or masses Cardiac:RRR; no murmurs, rubs, or gallops  Respiratory:  clear to auscultation bilaterally, normal work of breathing GI: soft, nontender, nondistended, + BS Ext: without cyanosis, clubbing, or edema, Good distal pulses bilaterally MS: no deformity or atrophy  Skin: warm and dry, no rash Neuro:  Alert and Oriented x 3, Strength and sensation are intact Psych: euthymic mood, full affect  Wt Readings from Last 3 Encounters:  02/07/19 140 lb (63.5 kg)  11/15/18 138 lb (62.6 kg)  09/28/18 138 lb 8 oz (62.8 kg)      Studies/Labs Reviewed:   EKG:  EKG is*** ordered today.  The ekg ordered today demonstrates ***  Recent Labs: 09/28/2018: ALT 8; TSH 0.76 02/07/2019: BUN 19; Creatinine, Ser 0.92; Hemoglobin 12.6; Platelets 210.0; Potassium 3.7; Sodium 139   Lipid Panel    Component Value Date/Time   CHOL 175 09/28/2018 0925   TRIG 38.0  09/28/2018 0925   TRIG 30 10/25/2006 1012   HDL 64.90 09/28/2018 0925   CHOLHDL 3 09/28/2018 0925   VLDL 7.6 09/28/2018 0925   LDLCALC 102 (H) 09/28/2018 0925    Additional studies/ records that were reviewed today include:  ***    ASSESSMENT:    No diagnosis found.   PLAN:  In order of problems listed above:      Medication Adjustments/Labs and Tests Ordered: Current medicines are reviewed at length with the patient today.  Concerns regarding medicines are outlined above.  Medication changes, Labs and Tests ordered today are listed in the Patient Instructions below. There are no Patient Instructions on file  for this visit.   Signed, Ermalinda Barrios, PA-C  03/08/2019 1:57 PM    [facility] [address], [place]  [address] Phone: [phone]; Fax: [phone]

## 2019-03-08 NOTE — Telephone Encounter (Signed)
I contacted patient about her upcoming appointment with me 03/14/2019.  Patient states she prefers to reschedule this because of the current coronavirus.  Overall she is coming in because of dyspnea on exertion and having to stop exercise after 10 minutes but then she can continue for another 20 minutes after resting for about 5 minutes.  She says this is been going on for a while and thinks it may be coming from her eyedrops.  She prefers to hold off for now and reschedule for several months.  This patient has never seen a physician in our office so please have her see Dr. Meda Coffee 4 months as she has only seen me in the past.

## 2019-03-14 ENCOUNTER — Ambulatory Visit: Payer: PPO | Admitting: Physician Assistant

## 2019-05-03 ENCOUNTER — Telehealth: Payer: Self-pay

## 2019-05-03 NOTE — Telephone Encounter (Signed)
Called pt to get them set up for their evisit. Voicemail has not been set up yet. Will call again later.

## 2019-05-04 NOTE — Telephone Encounter (Signed)
Called pt to set up evisit. Unable to leave message.

## 2019-05-08 ENCOUNTER — Telehealth: Payer: Self-pay

## 2019-05-08 NOTE — Progress Notes (Deleted)
{Choose 1 Note Type (Telehealth Visit or Telephone Visit):[phone]}   Date:  05/08/2019   ID:  Susan Mccarty, DOB 08/08/1951, MRN [id number]  {Patient Location:[phone]::"Home"} {Provider [phone]::"Home"}  PCP:  Janith Lima, MD  Cardiologist:  No primary care provider on file. *** Electrophysiologist:  None   Evaluation Performed:  {Choose Visit PZWC:[id number]::"PNTIRW-ER Visit"}  Chief Complaint:  ***  History of Present Illness:    Susan Mccarty is a 68 y.o. female   who I saw as new patient 08/26/17 for bradycardia with heart rates down to 40 bpm. She was asymptomatic. She exercises regularly and has some dyspnea on exertion when exercising in the heat but otherwise she has no symptoms. The only medication that could be lowering her heart rate was timolol for glaucoma and we asked her ophthalmologist to change this medication. Patient also had hypertension controlled with chlorthalidone and HLD diet controlled. 2-D echo 09/07/17 showed normal LVEF 55-60% with grade 1 DD mild TR and mildly elevated pulmonary pressure.  Patient's appt was cancelled 02/2018 due to covid19. She was having dyspnea on exertion .   The patient {does/does not:200015} have symptoms concerning for COVID-19 infection (fever, chills, cough, or new shortness of breath).    Past Medical History:  Diagnosis Date  . Bradycardia 08/17/2017  . Bradycardia, sinus 06/02/2016  . Deficiency anemia 08/17/2017  . Essential hypertension 06/02/2016  . Fibroid, uterine 09/17/2016  . Glaucoma   . Hyperlipidemia with target LDL less than 130 05/17/2014   Framingham risk score is 4%  . Hypokalemia 05/11/2017  . Iron deficiency anemia secondary to inadequate dietary iron intake 08/19/2017  . Loss of weight 09/29/2016  . Osteoporosis 01/2018   T score -2.5  . Other dietary vitamin B12 deficiency anemia 08/19/2017  . Postmenopausal postcoital bleeding 09/29/2016  . Routine health maintenance 09/26/2011  . Visit  for screening mammogram 05/27/2015  . Vitamin D deficiency   . Weight loss, non-intentional 09/17/2016   Past Surgical History:  Procedure Laterality Date  . BREAST CYST EXCISION  2006   right      No outpatient medications have been marked as taking for the 05/09/19 encounter (Appointment) with [name], PA-C.     Allergies:   Patient has no known allergies.   Social History   Tobacco Use  . Smoking status: Never Smoker  . Smokeless tobacco: Never Used  Substance Use Topics  . Alcohol use: Yes    Alcohol/week: 0.0 standard drinks    Comment: rare use  . Drug use: No     Family Hx: The patient's family history includes Alcohol abuse in her brother and mother; Breast cancer in her cousin; Diabetes in her mother and sister; Hypertension in her father and sister. There is no history of Stroke, Cancer, COPD, Early death, Hyperlipidemia, Colon cancer, Esophageal cancer, Rectal cancer, or Stomach cancer.  ROS:   Please see the history of present illness.    *** All other systems reviewed and are negative.   Prior CV studies:   The following studies were reviewed today:  2-D echo 09/07/17   Study Conclusions   - Left ventricle: The cavity size was normal. Wall thickness was   normal. Systolic function was normal. The estimated ejection   fraction was in the range of 55% to 60%. Wall motion was normal;   there were no regional wall motion abnormalities. Doppler   parameters are consistent with abnormal left ventricular   relaxation (grade 1 diastolic dysfunction). - Pulmonary  arteries: Systolic pressure was mildly increased.   Impressions:   - Normal LV systolic function; mild diastolic dysfunction; mild TR   with mildly elevated pulmonary pressure.     Labs/Other Tests and Data Reviewed:    EKG:  {EKG/Telemetry Strips Reviewed:[phone]}  Recent Labs: 09/28/2018: ALT 8; TSH 0.76 02/07/2019: BUN 19; Creatinine, Ser 0.92; Hemoglobin 12.6; Platelets 210.0;  Potassium 3.7; Sodium 139   Recent Lipid Panel Lab Results  Component Value Date/Time   CHOL 175 09/28/2018 09:25 AM   TRIG 38.0 09/28/2018 09:25 AM   TRIG 30 10/25/2006 10:12 AM   HDL 64.90 09/28/2018 09:25 AM   CHOLHDL 3 09/28/2018 09:25 AM   LDLCALC 102 (H) 09/28/2018 09:25 AM    Wt Readings from Last 3 Encounters:  02/07/19 140 lb (63.5 kg)  11/15/18 138 lb (62.6 kg)  09/28/18 138 lb 8 oz (62.8 kg)     Objective:    Vital Signs:  There were no vitals taken for this visit.   {[facility] Virtual Exam (Optional):[phone]::"VITAL SIGNS:  reviewed"}  ASSESSMENT & PLAN:    1. Bradycardia on timolol in the past improved to 50's off timolol. Echo with normal LVEF and mild DD with mildly elevated pulm pressures 2. Essential HTN 3. Hyperlipidemia  COVID-19 Education: The signs and symptoms of COVID-19 were discussed with the patient and how to seek care for testing (follow up with PCP or arrange E-visit).  ***The importance of social distancing was discussed today.  Time:   Today, I have spent *** minutes with the patient with telehealth technology discussing the above problems.     Medication Adjustments/Labs and Tests Ordered: Current medicines are reviewed at length with the patient today.  Concerns regarding medicines are outlined above.   Tests Ordered: No orders of the defined types were placed in this encounter.   Medication Changes: No orders of the defined types were placed in this encounter.   Disposition:  Follow up {follow up:15908}  Signed, Ermalinda Barrios, PA-C  05/08/2019 11:16 AM    [facility]

## 2019-05-08 NOTE — Telephone Encounter (Signed)
Attempted to contact patient again regarding her appointment tomorrow with Ermalinda Barrios, PA but there was no answer and VM not set up. Patient needs to be transitioned to VIDEO Visit with [employer] and consent needs to be documented.

## 2019-05-08 NOTE — Telephone Encounter (Signed)
Attempted to contact patient regarding her appointment tomorrow with Ermalinda Barrios, PA but there was no answer and VM not set up. Patient needs to be transitioned to VIDEO Visit with [employer] and consent needs to be documented.

## 2019-05-09 ENCOUNTER — Telehealth: Payer: PPO | Admitting: Physician Assistant

## 2019-05-09 ENCOUNTER — Other Ambulatory Visit: Payer: Self-pay

## 2019-05-30 NOTE — Telephone Encounter (Signed)
Attempted to contact patient again to reschedule her visit with Ermalinda Barrios, PA but there was no answer and VM not set up. Left a message with the patient's sister for patient to call the office.

## 2019-06-01 ENCOUNTER — Telehealth: Payer: Self-pay | Admitting: Physician Assistant

## 2019-06-01 NOTE — Telephone Encounter (Signed)
I am sending this call to the billing dept as pt is inquiring if she has been charged for the May appt she was supposed to have. Billing will be able to advise pt if charged or not.

## 2019-06-01 NOTE — Telephone Encounter (Signed)
Follow up:     Patient returning a call back concering a appt she states she dose not have Internet. Please call patient.

## 2019-06-01 NOTE — Telephone Encounter (Signed)
New Message     Pt is wondering if she was charged for her May appt she did not do. She said she did not know how to do a Video Visit     Please call back

## 2019-07-05 ENCOUNTER — Ambulatory Visit: Payer: Self-pay | Admitting: *Deleted

## 2019-07-05 NOTE — Telephone Encounter (Addendum)
Summary: COVID testing questions   Pt states her sister has tested positive for COVID and would like to know if she should be tested as well.  Please call pt after 11am to advise.      Will route to office for final disposition.

## 2019-07-05 NOTE — Telephone Encounter (Signed)
Pt contacted and she does not have symptoms. Pt informed to monitor for symptoms and to let us know if any develop.

## 2019-07-05 NOTE — Telephone Encounter (Signed)
Please advise on what triage note was regarding. I do not see any documentation below.

## 2019-07-11 DIAGNOSIS — H401123 Primary open-angle glaucoma, left eye, severe stage: Secondary | ICD-10-CM | POA: Diagnosis not present

## 2019-07-11 DIAGNOSIS — H401112 Primary open-angle glaucoma, right eye, moderate stage: Secondary | ICD-10-CM | POA: Diagnosis not present

## 2019-07-11 DIAGNOSIS — H25813 Combined forms of age-related cataract, bilateral: Secondary | ICD-10-CM | POA: Diagnosis not present

## 2019-07-31 NOTE — Progress Notes (Signed)
Cardiology Office Note    Date:  08/01/2019   ID:  Susan Mccarty, DOB 1951/01/01, MRN [id number]  PCP:  Janith Lima, MD  Cardiologist: Ena Dawley, MD EPS: None  No chief complaint on file.   History of Present Illness:  Susan Mccarty is a 68 y.o. female   who I saw as new patient 08/26/17 for bradycardia with heart rates down to 40 bpm. She was asymptomatic. She exercises regularly and has some dyspnea on exertion when exercising in the heat but otherwise she has no symptoms. The only medication that could be lowering her heart rate was timolol for glaucoma and we asked her ophthalmologist to change this medication. Patient also had hypertension controlled with chlorthalidone and HLD diet controlled. 2-D echo 09/07/17 showed normal LVEF 55-60% with grade 1 DD mild TR and mildly elevated pulmonary pressure. Normal GXT 10/2018  Complains of shortness of breath when walking or exercising for the past couple of months. Does squats, sit ups, weights and has to stop at times. Started on new eye drops a month ago-simbrinza and started back on timolol.  Shortness of breath started before the eye drops. She thinks it could be heat related. Dizzy only if she jumps up quickly. [place]. Denies chest tightness.       Past Medical History:  Diagnosis Date  . Bradycardia 08/17/2017  . Bradycardia, sinus 06/02/2016  . Deficiency anemia 08/17/2017  . Essential hypertension 06/02/2016  . Fibroid, uterine 09/17/2016  . Glaucoma   . Hyperlipidemia with target LDL less than 130 05/17/2014   Framingham risk score is 4%  . Hypokalemia 05/11/2017  . Iron deficiency anemia secondary to inadequate dietary iron intake 08/19/2017  . Loss of weight 09/29/2016  . Osteoporosis 01/2018   T score -2.5  . Other dietary vitamin B12 deficiency anemia 08/19/2017  . Postmenopausal postcoital bleeding 09/29/2016  . Routine health maintenance 09/26/2011  . Visit for screening mammogram 05/27/2015   . Vitamin D deficiency   . Weight loss, non-intentional 09/17/2016    Past Surgical History:  Procedure Laterality Date  . BREAST CYST EXCISION  2006   right     Current Medications: Current Meds  Medication Sig  . alendronate (FOSAMAX) 70 MG tablet Take 1 tablet (70 mg total) by mouth every 7 (seven) days. Take with a full glass of water on an empty stomach.  . Brinzolamide-Brimonidine (SIMBRINZA) 1-0.2 % SUSP Apply to eye.  . chlorthalidone (HYGROTON) 25 MG tablet Take 1 tablet (25 mg total) by mouth daily.  . Cholecalciferol (VITAMIN D3) 2000 units capsule Take 2 capsules (4,000 Units total) by mouth daily.  Stasia Cavalier (EUCRISA) 2 % OINT Apply 1 Act topically 2 (two) times daily.  . cyanocobalamin 2000 MCG tablet Take 1 tablet (2,000 mcg total) by mouth daily.  . ferrous sulfate 220 (44 Fe) MG/5ML solution Take 5 mLs (220 mg total) by mouth 2 (two) times daily with a meal.  . Iron-Vitamins (GERITOL) LIQD Take by mouth daily.  . potassium chloride SA (K-DUR,KLOR-CON) 20 MEQ tablet Take 1 tablet (20 mEq total) by mouth 2 (two) times daily.  . timolol (TIMOPTIC) 0.25 % ophthalmic solution 1 drop 2 (two) times daily.  . vitamin A 8000 UNIT capsule Take 8,000 Units by mouth daily.  . vitamin C (ASCORBIC ACID) 500 MG tablet Take 500 mg by mouth daily.  . vitamin E 400 UNIT capsule Take 400 Units by mouth daily.     Allergies:  Patient has no known allergies.   Social History   Socioeconomic History  . Marital status: Single    Spouse name: Not on file  . Number of children: 0  . Years of education: 44  . Highest education level: Not on file  Occupational History  . Occupation: SEAMSTRESS    Employer: [employer]  . Financial resource strain: Not hard at all  . Food insecurity    Worry: Never true    Inability: Never true  . Transportation needs    Medical: No    Non-medical: No  Tobacco Use  . Smoking status: Never Smoker  . Smokeless tobacco:  Never Used  Substance and Sexual Activity  . Alcohol use: Yes    Alcohol/week: 0.0 standard drinks    Comment: rare use  . Drug use: No  . Sexual activity: Yes    Partners: Male    Comment: 1st intercourse 68 yo-Fewer than 5 partners  Lifestyle  . Physical activity    Days per week: 2 days    Minutes per session: 40 min  . Stress: Not at all  Relationships  . Social Herbalist on phone: Not on file    Gets together: Not on file    Attends religious service: Not on file    Active member of club or organization: Not on file    Attends meetings of clubs or organizations: Not on file    Relationship status: Not on file  Other Topics Concern  . Not on file  Social History Narrative   HSG, no college. Single - in a relationship. No children. 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     Family History:  The patient's   family history includes Alcohol abuse in her brother and mother; Breast cancer in her cousin; Diabetes in her mother and sister; Hypertension in her father and sister.   ROS:   Please see the history of present illness.    Review of Systems  Cardiovascular: Positive for dyspnea on exertion.   All other systems reviewed and are negative.   PHYSICAL EXAM:   VS:  BP 136/70   Pulse (!) 47   Ht 5\' 8"  (1.727 m)   Wt 137 lb (62.1 kg)   SpO2 99%   BMI 20.83 kg/m   Physical Exam  GEN: Thin, in no acute distress  Neck: no JVD, carotid bruits, or masses Cardiac:RRR; no murmurs, rubs, or gallops  Respiratory:  clear to auscultation bilaterally, normal work of breathing GI: soft, nontender, nondistended, + BS Ext: without cyanosis, clubbing, or edema, Good distal pulses bilaterally Neuro:  Alert and Oriented x 3 Psych: euthymic mood, full affect  Wt Readings from Last 3 Encounters:  08/01/19 137 lb (62.1 kg)  02/07/19 140 lb (63.5 kg)  11/15/18 138 lb (62.6 kg)      Studies/Labs Reviewed:   EKG:  EKG is ordered today.  The ekg  ordered today demonstrates sinus bradycardia at 47/m  Recent Labs: 09/28/2018: ALT 8; TSH 0.76 02/07/2019: BUN 19; Creatinine, Ser 0.92; Hemoglobin 12.6; Platelets 210.0; Potassium 3.7; Sodium 139   Lipid Panel    Component Value Date/Time   CHOL 175 09/28/2018 0925   TRIG 38.0 09/28/2018 0925   TRIG 30 10/25/2006 1012   HDL 64.90 09/28/2018 0925   CHOLHDL 3 09/28/2018 0925   VLDL 7.6 09/28/2018 0925   LDLCALC 102 (H) 09/28/2018 0925    Additional studies/ records that were  reviewed today include:   2-D echo 09/07/17   Study Conclusions   - Left ventricle: The cavity size was normal. Wall thickness was   normal. Systolic function was normal. The estimated ejection   fraction was in the range of 55% to 60%. Wall motion was normal;   there were no regional wall motion abnormalities. Doppler   parameters are consistent with abnormal left ventricular   relaxation (grade 1 diastolic dysfunction). - Pulmonary arteries: Systolic pressure was mildly increased.   Impressions:   - Normal LV systolic function; mild diastolic dysfunction; mild TR   with mildly elevated pulmonary pressure.       ASSESSMENT:    1. Dyspnea on exertion   2. Bradycardia, sinus   3. Essential hypertension   4. Hyperlipidemia, unspecified hyperlipidemia type      PLAN:  In order of problems listed above:  Dyspnea on exertion with exercise. Had normal GXT 10/2018, echo 2018 normal LVEF. She has baseline bradycardia and was started back on timolol a month ago. I suspect her HR is remaining low and causing these symptoms. I will place holter monitor to see what HR is doing. If no bradycardia consider Lexi and repeat echo.  Bradycardia felt secondary to timolol in the past now back on because of high pressures per opthamologist  Essential HTN controlled on chlorthalidone  HLD-LDL 102 09/2018 goal less than 90    Medication Adjustments/Labs and Tests Ordered: Current medicines are reviewed at  length with the patient today.  Concerns regarding medicines are outlined above.  Medication changes, Labs and Tests ordered today are listed in the Patient Instructions below. Patient Instructions  Medication Instructions:  Your physician recommends that you continue on your current medications as directed. Please refer to the Current Medication list given to you today.  If you need a refill on your cardiac medications before your next appointment, please call your pharmacy.   Lab work: None Ordered  If you have labs (blood work) drawn today and your tests are completely normal, you will receive your results only by: Marland Kitchen MyChart Message (if you have MyChart) OR . A paper copy in the mail If you have any lab test that is abnormal or we need to change your treatment, we will call you to review the results.  Testing/Procedures: Your physician has recommended that you wear an event monitor. Event monitors are medical devices that record the heart's electrical activity. Doctors most often [place] these monitors to diagnose arrhythmias. Arrhythmias are problems with the speed or rhythm of the heartbeat. The monitor is a small, portable device. You can wear one while you do your normal daily activities. This is usually used to diagnose what is causing palpitations/syncope (passing out).  Follow-Up: . Follow up with Ermalinda Barrios, PA via TELEPHONE Visit on 09/13/19 at 9:00 AM  Any Other Special Instructions Will Be Listed Below (If Applicable).       Signed, Ermalinda Barrios, PA-C  08/01/2019 9:25 AM    [facility] [address], [place], [place]  [address] Phone: [phone]; Fax: [phone]

## 2019-08-01 ENCOUNTER — Telehealth: Payer: Self-pay | Admitting: *Deleted

## 2019-08-01 ENCOUNTER — Ambulatory Visit: Payer: PPO | Admitting: Physician Assistant

## 2019-08-01 ENCOUNTER — Encounter: Payer: Self-pay | Admitting: Physician Assistant

## 2019-08-01 ENCOUNTER — Other Ambulatory Visit: Payer: Self-pay

## 2019-08-01 VITALS — BP 136/70 | HR 47 | Ht 68.0 in | Wt 137.0 lb

## 2019-08-01 DIAGNOSIS — R0609 Other forms of dyspnea: Secondary | ICD-10-CM

## 2019-08-01 DIAGNOSIS — I10 Essential (primary) hypertension: Secondary | ICD-10-CM

## 2019-08-01 DIAGNOSIS — R001 Bradycardia, unspecified: Secondary | ICD-10-CM | POA: Diagnosis not present

## 2019-08-01 DIAGNOSIS — E785 Hyperlipidemia, unspecified: Secondary | ICD-10-CM | POA: Diagnosis not present

## 2019-08-01 NOTE — Telephone Encounter (Signed)
[employer] to ship a 30 day cardiac event monitor to your home. Instructions reviewed briefly as they are included in the monitor kit. 

## 2019-08-01 NOTE — Patient Instructions (Addendum)
Medication Instructions:  Your physician recommends that you continue on your current medications as directed. Please refer to the Current Medication list given to you today.  If you need a refill on your cardiac medications before your next appointment, please call your pharmacy.   Lab work: None Ordered  If you have labs (blood work) drawn today and your tests are completely normal, you will receive your results only by: Marland Kitchen MyChart Message (if you have MyChart) OR . A paper copy in the mail If you have any lab test that is abnormal or we need to change your treatment, we will call you to review the results.  Testing/Procedures: Your physician has recommended that you wear an event monitor. Event monitors are medical devices that record the heart's electrical activity. Doctors most often [place] these monitors to diagnose arrhythmias. Arrhythmias are problems with the speed or rhythm of the heartbeat. The monitor is a small, portable device. You can wear one while you do your normal daily activities. This is usually used to diagnose what is causing palpitations/syncope (passing out).  Follow-Up: . Follow up with Ermalinda Barrios, PA via TELEPHONE Visit on 09/13/19 at 9:00 AM  Any Other Special Instructions Will Be Listed Below (If Applicable).

## 2019-08-10 ENCOUNTER — Telehealth: Payer: Self-pay | Admitting: *Deleted

## 2019-08-10 NOTE — Telephone Encounter (Signed)
Patient unable to talk she is at work.  Will arrange for patient to be called at [phone], 08/16/2019 at 9:00 AM, to assist patient with applying monitor.

## 2019-08-18 NOTE — Telephone Encounter (Signed)
Follow Up  Patient states that she would like a different alternative to using the heart monitor. Patient states that she couldn't get any help with setting up the monitor and no longer wants to use it. Please give patient a call back to discuss.

## 2019-08-23 ENCOUNTER — Telehealth: Payer: Self-pay | Admitting: *Deleted

## 2019-08-23 NOTE — Telephone Encounter (Signed)
Patient will not apply Cardiac event monitor herself.  She was prepared to send the monitor back to [employer].  Scheduled patient to come into office at 9:00, Wednesday, 08/30/2019, to have monitor applied.

## 2019-08-25 DIAGNOSIS — R001 Bradycardia, unspecified: Secondary | ICD-10-CM | POA: Diagnosis not present

## 2019-08-30 ENCOUNTER — Ambulatory Visit (INDEPENDENT_AMBULATORY_CARE_PROVIDER_SITE_OTHER): Payer: PPO

## 2019-08-30 ENCOUNTER — Other Ambulatory Visit: Payer: Self-pay

## 2019-08-30 DIAGNOSIS — R001 Bradycardia, unspecified: Secondary | ICD-10-CM

## 2019-09-07 ENCOUNTER — Telehealth: Payer: Self-pay | Admitting: Physician Assistant

## 2019-09-07 NOTE — Telephone Encounter (Signed)
Attempted to contact patient to let her know her results should be available approximately 1 week after her last day wearing the monitor which should be 09/28/19.   No answer, voice mail not set up.

## 2019-09-07 NOTE — Telephone Encounter (Signed)
New message:    Patient calling asking about her monitor. She would like to know dose she get a up date. please call patient.

## 2019-09-11 NOTE — Progress Notes (Deleted)
{Choose 1 Note Type (Telehealth Visit or Telephone Visit):[phone]}   Date:  09/11/2019   ID:  Susan Mccarty, DOB Oct 17, 1951, MRN [id number]  {Patient [phone]::"Home"} {Provider Location:[phone]::"Home"}  PCP:  Janith Lima, MD  Cardiologist:  Ena Dawley, MD *** Electrophysiologist:  None   Evaluation Performed:  {Choose Visit [phone]::"Follow-Up Visit"}  Chief Complaint:  ***  History of Present Illness:    Susan Mccarty is a 68 y.o. female with who I saw as new patient 08/26/17 for bradycardia with heart rates down to 40 bpm. She was asymptomatic. She exercises regularly and has some dyspnea on exertion when exercising in the heat but otherwise she has no symptoms. The only medication that could be lowering her heart rate was timolol for glaucoma and we asked her ophthalmologist to change this medication. Patient also had hypertension controlled with chlorthalidone and HLD diet controlled. 2-D echo 09/07/17 showed normal LVEF 55-60% with grade 1 DD mild TR and mildly elevated pulmonary pressure. Normal GXT 10/2018.  I saw her back 08/01/19 with dyspnea on exertion and was back on timolol.   The patient {does/does not:200015} have symptoms concerning for COVID-19 infection (fever, chills, cough, or new shortness of breath).    Past Medical History:  Diagnosis Date  . Bradycardia 08/17/2017  . Bradycardia, sinus 06/02/2016  . Deficiency anemia 08/17/2017  . Essential hypertension 06/02/2016  . Fibroid, uterine 09/17/2016  . Glaucoma   . Hyperlipidemia with target LDL less than 130 05/17/2014   Framingham risk score is 4%  . Hypokalemia 05/11/2017  . Iron deficiency anemia secondary to inadequate dietary iron intake 08/19/2017  . Loss of weight 09/29/2016  . Osteoporosis 01/2018   T score -2.5  . Other dietary vitamin B12 deficiency anemia 08/19/2017  . Postmenopausal postcoital bleeding 09/29/2016  . Routine health maintenance 09/26/2011  . Visit for  screening mammogram 05/27/2015  . Vitamin D deficiency   . Weight loss, non-intentional 09/17/2016   Past Surgical History:  Procedure Laterality Date  . BREAST CYST EXCISION  2006   right      No outpatient medications have been marked as taking for the 09/13/19 encounter (Appointment) with [name], PA-C.     Allergies:   Patient has no known allergies.   Social History   Tobacco Use  . Smoking status: Never Smoker  . Smokeless tobacco: Never Used  Substance Use Topics  . Alcohol use: Yes    Alcohol/week: 0.0 standard drinks    Comment: rare use  . Drug use: No     Family Hx: The patient's family history includes Alcohol abuse in her brother and mother; Breast cancer in her cousin; Diabetes in her mother and sister; Hypertension in her father and sister. There is no history of Stroke, Cancer, COPD, Early death, Hyperlipidemia, Colon cancer, Esophageal cancer, Rectal cancer, or Stomach cancer.  ROS:   Please see the history of present illness.    *** All other systems reviewed and are negative.   Prior CV studies:   The following studies were reviewed today: 30 Day monitor:  2-D echo 09/07/17   Study Conclusions   - Left ventricle: The cavity size was normal. Wall thickness was   normal. Systolic function was normal. The estimated ejection   fraction was in the range of 55% to 60%. Wall motion was normal;   there were no regional wall motion abnormalities. Doppler   parameters are consistent with abnormal left ventricular   relaxation (grade 1 diastolic dysfunction). -  Pulmonary arteries: Systolic pressure was mildly increased.   Impressions:   - Normal LV systolic function; mild diastolic dysfunction; mild TR   with mildly elevated pulmonary pressure.           Labs/Other Tests and Data Reviewed:    EKG:  {EKG/Telemetry Strips [phone]}  Recent Labs: 09/28/2018: ALT 8; TSH 0.76 02/07/2019: BUN 19; Creatinine, Ser 0.92; Hemoglobin 12.6;  Platelets 210.0; Potassium 3.7; Sodium 139   Recent Lipid Panel Lab Results  Component Value Date/Time   CHOL 175 09/28/2018 09:25 AM   TRIG 38.0 09/28/2018 09:25 AM   TRIG 30 10/25/2006 10:12 AM   HDL 64.90 09/28/2018 09:25 AM   CHOLHDL 3 09/28/2018 09:25 AM   LDLCALC 102 (H) 09/28/2018 09:25 AM    Wt Readings from Last 3 Encounters:  08/01/19 137 lb (62.1 kg)  02/07/19 140 lb (63.5 kg)  11/15/18 138 lb (62.6 kg)     Objective:    Vital Signs:  There were no vitals taken for this visit.   {HeartCare Virtual Exam (Optional):[id number]::"VITAL SIGNS:  reviewed"}  ASSESSMENT & PLAN:    1. Dyspnea on exertion felt secondary to bradycardia on timolol. 30 day monitor 2. Essential HTN 3. Bradycardia 4. HLD  COVID-19 Education: The signs and symptoms of COVID-19 were discussed with the patient and how to seek care for testing (follow up with PCP or arrange E-visit).  ***The importance of social distancing was discussed today.  Time:   Today, I have spent *** minutes with the patient with telehealth technology discussing the above problems.     Medication Adjustments/Labs and Tests Ordered: Current medicines are reviewed at length with the patient today.  Concerns regarding medicines are outlined above.   Tests Ordered: No orders of the defined types were placed in this encounter.   Medication Changes: No orders of the defined types were placed in this encounter.   Follow Up:  {F/U Format:[phone]} {follow up:15908}  Signed, Ermalinda Barrios, PA-C  09/11/2019 3:09 PM    [facility]

## 2019-09-13 ENCOUNTER — Telehealth: Payer: PPO | Admitting: Physician Assistant

## 2019-09-27 ENCOUNTER — Encounter: Payer: Self-pay | Admitting: Gynecology

## 2019-10-04 ENCOUNTER — Telehealth: Payer: PPO | Admitting: Physician Assistant

## 2019-10-04 DIAGNOSIS — H25813 Combined forms of age-related cataract, bilateral: Secondary | ICD-10-CM | POA: Diagnosis not present

## 2019-10-04 DIAGNOSIS — H401123 Primary open-angle glaucoma, left eye, severe stage: Secondary | ICD-10-CM | POA: Diagnosis not present

## 2019-10-04 DIAGNOSIS — H401112 Primary open-angle glaucoma, right eye, moderate stage: Secondary | ICD-10-CM | POA: Diagnosis not present

## 2019-10-04 NOTE — Progress Notes (Signed)
Date:  10/04/2019   ID:  CAMONI CILIBERTO, DOB 09/04/1951, MRN [id number]    PCP:  Janith Lima, MD  Cardiologist:  Ena Dawley, MD  Electrophysiologist:  None   Evaluation Performed:  Chief Complaint:    History of Present Illness:    Susan Mccarty is a 68 y.o. female with who I saw as new patient 08/26/17 for bradycardia with heart rates down to 40 bpm. She was asymptomatic. She exercises regularly and has some dyspnea on exertion when exercising in the heat but otherwise she has no symptoms. The only medication that could be lowering her heart rate was timolol for glaucoma and we asked her ophthalmologist to change this medication. Patient also had hypertension controlled with chlorthalidone and HLD diet controlled. 2-D echo 09/07/17 showed normal LVEF 55-60% with grade 1 DD mild TR and mildly elevated pulmonary pressure. Normal GXT 10/2018   I saw the patient 08/01/2019 complaining of shortness of breath when walking or exercising for a couple months.  She was back on timolol eyedrops.  EKG that day showed sinus bradycardia rate of 47 bpm.  I ordered a 30-day monitor to see if bradycardia was causing her symptoms.  If not consider Lexi and repeat echo. Monitor showed. Sinus bradycardia in 50's.   The patient have symptoms concerning for COVID-19 infection (fever, chills, cough, or new shortness of breath).    Past Medical History:  Diagnosis Date  . Bradycardia 08/17/2017  . Bradycardia, sinus 06/02/2016  . Deficiency anemia 08/17/2017  . Essential hypertension 06/02/2016  . Fibroid, uterine 09/17/2016  . Glaucoma   . Hyperlipidemia with target LDL less than 130 05/17/2014   Framingham risk score is 4%  . Hypokalemia 05/11/2017  . Iron deficiency anemia secondary to inadequate dietary iron intake 08/19/2017  . Loss of weight 09/29/2016  . Osteoporosis 01/2018   T score -2.5  . Other dietary vitamin B12 deficiency anemia 08/19/2017  . Postmenopausal postcoital bleeding 09/29/2016   . Routine health maintenance 09/26/2011  . Visit for screening mammogram 05/27/2015  . Vitamin D deficiency   . Weight loss, non-intentional 09/17/2016   Past Surgical History:  Procedure Laterality Date  . BREAST CYST EXCISION  2006   right      No outpatient medications have been marked as taking for the 10/17/19 encounter (Appointment) with [name], PA-C.     Allergies:   Patient has no known allergies.   Social History   Tobacco Use  . Smoking status: Never Smoker  . Smokeless tobacco: Never Used  Substance Use Topics  . Alcohol use: Yes    Alcohol/week: 0.0 standard drinks    Comment: rare use  . Drug use: No     Family Hx: The patient's family history includes Alcohol abuse in her brother and mother; Breast cancer in her cousin; Diabetes in her mother and sister; Hypertension in her father and sister. There is no history of Stroke, Cancer, COPD, Early death, Hyperlipidemia, Colon cancer, Esophageal cancer, Rectal cancer, or Stomach cancer.  ROS:   Please see the history of present illness.     All other systems reviewed and are negative.   Prior CV studies:   The following studies were reviewed today:   2-D echo 09/07/17   Study Conclusions   - Left ventricle: The cavity size was normal. Wall thickness was   normal. Systolic function was normal. The estimated ejection   fraction was in the range of 55% to 60%. Wall motion  was normal;   there were no regional wall motion abnormalities. Doppler   parameters are consistent with abnormal left ventricular   relaxation (grade 1 diastolic dysfunction). - Pulmonary arteries: Systolic pressure was mildly increased.   Impressions:   - Normal LV systolic function; mild diastolic dysfunction; mild TR   with mildly elevated pulmonary pressure.      Labs/Other Tests and Data Reviewed:    EKG:   Recent Labs: 02/07/2019: BUN 19; Creatinine, Ser 0.92; Hemoglobin 12.6; Platelets 210.0; Potassium 3.7; Sodium  139   Recent Lipid Panel Lab Results  Component Value Date/Time   CHOL 175 09/28/2018 09:25 AM   TRIG 38.0 09/28/2018 09:25 AM   TRIG 30 10/25/2006 10:12 AM   HDL 64.90 09/28/2018 09:25 AM   CHOLHDL 3 09/28/2018 09:25 AM   LDLCALC 102 (H) 09/28/2018 09:25 AM    Wt Readings from Last 3 Encounters:  08/01/19 137 lb (62.1 kg)  02/07/19 140 lb (63.5 kg)  11/15/18 138 lb (62.6 kg)     Objective:    Vital Signs:  There were no vitals taken for this visit.     ASSESSMENT & PLAN:    Dyspnea on exertion with exercise. Had normal GXT 10/2018, echo 2018 normal LVEF. She has baseline bradycardia and was started back on timolol -holter monitor  Bradycardia felt secondary to atenolol in the past but because of high pressures her ophthalmologist restarted it  Essential hypertension on chlorthalidone  Hyperlipidemia LDL 102 09/2018 goal less than 90     COVID-19 Education: The signs and symptoms of COVID-19 were discussed with the patient and how to seek care for testing (follow up with PCP or arrange E-visit).  The importance of social distancing was discussed today.  Time:   Today, I have spent minutes with the patient with telehealth technology discussing the above problems.     Medication Adjustments/Labs and Tests Ordered: Current medicines are reviewed at length with the patient today.  Concerns regarding medicines are outlined above.   Tests Ordered: No orders of the defined types were placed in this encounter.   Medication Changes: No orders of the defined types were placed in this encounter.   Follow Up:    Sumner Boast, PA-C  10/04/2019 2:51 PM    [facility] This encounter was created in error - please disregard.

## 2019-10-16 ENCOUNTER — Telehealth: Payer: Self-pay

## 2019-10-16 NOTE — Telephone Encounter (Signed)
Attempted to call and confirm virtual appt and obtain virtual consent, but no answer.

## 2019-10-17 ENCOUNTER — Encounter: Payer: PPO | Admitting: Physician Assistant

## 2019-10-17 ENCOUNTER — Other Ambulatory Visit: Payer: Self-pay

## 2019-10-17 ENCOUNTER — Encounter: Payer: Self-pay | Admitting: Physician Assistant

## 2019-10-30 ENCOUNTER — Other Ambulatory Visit: Payer: Self-pay | Admitting: Internal Medicine

## 2019-10-31 ENCOUNTER — Telehealth: Payer: Self-pay

## 2019-10-31 NOTE — Telephone Encounter (Signed)
Refill request from [facility] for chlorthalidone. Per PCP, patient will need to be seen.   Tried to call pt. No answer and no vm to leave a message.

## 2019-11-27 ENCOUNTER — Other Ambulatory Visit: Payer: Self-pay | Admitting: Internal Medicine

## 2019-11-27 DIAGNOSIS — I10 Essential (primary) hypertension: Secondary | ICD-10-CM

## 2019-11-27 NOTE — Telephone Encounter (Signed)
Medication Refill - Medication: chlorthalidone (HYGROTON) 25 MG tablet [id number]   Has the patient contacted their pharmacy? No. (Agent: If no, request that the patient contact the pharmacy for the refill.) (Agent: If yes, when and what did the pharmacy advise?)  Preferred Pharmacy (with phone number or street name):  [facility], [place] [phone] (Phone)     Agent: Please be advised that RX refills may take up to 3 business days. We ask that you follow-up with your pharmacy.

## 2019-11-27 NOTE — Telephone Encounter (Signed)
Requested medication (s) are due for refill today: yes  Requested medication (s) are on the active medication list: yes  Last refill:  03/02/2019  Future visit scheduled: no  Notes to clinic: overdue for office visit Patient has 2 pills left   Requested Prescriptions  Pending Prescriptions Disp Refills   chlorthalidone (HYGROTON) 25 MG tablet 90 tablet 1    Sig: Take 1 tablet (25 mg total) by mouth daily.     Cardiovascular: Diuretics - Thiazide Failed - 11/27/2019  9:46 AM      Failed - Valid encounter within last 6 months    Recent Outpatient Visits          9 months ago Iron deficiency anemia secondary to inadequate dietary iron intake   [place], Thomas L, MD   1 year ago Essential hypertension   [place], Thomas L, MD   2 years ago Essential hypertension   [place], Thomas L, MD   2 years ago Hyperlipidemia with target LDL less than Amery, Thomas L, MD   3 years ago Uterine leiomyoma, unspecified location   [place], MD      Future Appointments            In 1 week [name], PA-C [place]           Passed - Ca in normal range and within 360 days    Calcium  Date Value Ref Range Status  02/07/2019 9.6 8.4 - 10.5 mg/dL Final         Passed - Cr in normal range and within 360 days    Creatinine, Ser  Date Value Ref Range Status  02/07/2019 0.92 0.40 - 1.20 mg/dL Final         Passed - K in normal range and within 360 days    Potassium  Date Value Ref Range Status  02/07/2019 3.7 3.5 - 5.1 mEq/L Final         Passed - Na in normal range and within 360 days    Sodium  Date Value Ref Range Status  02/07/2019 139 135 - 145 mEq/L Final         Passed - Last BP in normal range    BP Readings from Last 1 Encounters:   10/17/19 128/70

## 2019-11-29 ENCOUNTER — Other Ambulatory Visit: Payer: Self-pay | Admitting: Internal Medicine

## 2019-11-29 DIAGNOSIS — I10 Essential (primary) hypertension: Secondary | ICD-10-CM

## 2019-11-29 MED ORDER — CHLORTHALIDONE 25 MG PO TABS: 25.0000 mg | ORAL_TABLET | Freq: Every day | ORAL | 0 refills | Status: DC

## 2019-11-29 NOTE — Progress Notes (Signed)
Cardiology Office Note    Date:  12/05/2019   ID:  Susan Mccarty, DOB 1951/01/27, MRN [id number]  PCP:  Janith Lima, MD  Cardiologist: Ena Dawley, MD EPS: None  No chief complaint on file.   History of Present Illness:  Susan Mccarty is a 68 y.o. female with who I saw as new patient 08/26/17 for bradycardia with heart rates down to 40 bpm. She was asymptomatic. She exercises regularly and has some dyspnea on exertion when exercising in the heat but otherwise she has no symptoms. The only medication that could be lowering her heart rate was timolol for glaucoma and we asked her ophthalmologist to change this medication. Patient also had hypertension controlled with chlorthalidone and HLD diet controlled. 2-D echo 09/07/17 showed normal LVEF 55-60% with grade 1 DD mild TR and mildly elevated pulmonary pressure. Normal GXT 10/2018   I saw the patient 08/01/2019 complaining of shortness of breath when walking or exercising for a couple months.  She was back on timolol eyedrops.  EKG that day showed sinus bradycardia rate of 47 bpm.  I ordered a 30-day monitor to see if bradycardia was causing her symptoms.  If not consider Lexi and repeat echo. Monitor showed. Sinus bradycardia in 50's.   Patient says HR 45-48 in am. Timolol is down to one drop/day. Breathing has improved. Does sit ups/squats, walks 5-6 blocks downtown daily. Still working at her shop. No dizziness or presyncope. Doing better on lower dose timolol.       Past Medical History:  Diagnosis Date  . Bradycardia 08/17/2017  . Bradycardia, sinus 06/02/2016  . Deficiency anemia 08/17/2017  . Essential hypertension 06/02/2016  . Fibroid, uterine 09/17/2016  . Glaucoma   . Hyperlipidemia with target LDL less than 130 05/17/2014   Framingham risk score is 4%  . Hypokalemia 05/11/2017  . Iron deficiency anemia secondary to inadequate dietary iron intake 08/19/2017  . Loss of weight 09/29/2016  . Osteoporosis 01/2018   T score  -2.5  . Other dietary vitamin B12 deficiency anemia 08/19/2017  . Postmenopausal postcoital bleeding 09/29/2016  . Routine health maintenance 09/26/2011  . Visit for screening mammogram 05/27/2015  . Vitamin D deficiency   . Weight loss, non-intentional 09/17/2016    Past Surgical History:  Procedure Laterality Date  . BREAST CYST EXCISION  2006   right     Current Medications: Current Meds  Medication Sig  . alendronate (FOSAMAX) 70 MG tablet Take 1 tablet (70 mg total) by mouth every 7 (seven) days. Take with a full glass of water on an empty stomach.  . Brinzolamide-Brimonidine (SIMBRINZA) 1-0.2 % SUSP Apply to eye.  . chlorthalidone (HYGROTON) 25 MG tablet Take 1 tablet (25 mg total) by mouth daily.  . Cholecalciferol (VITAMIN D3) 2000 units capsule Take 2 capsules (4,000 Units total) by mouth daily.  Stasia Cavalier (EUCRISA) 2 % OINT Apply 1 Act topically 2 (two) times daily.  . cyanocobalamin 2000 MCG tablet Take 1 tablet (2,000 mcg total) by mouth daily.  . ferrous sulfate 220 (44 Fe) MG/5ML solution Take 5 mLs (220 mg total) by mouth 2 (two) times daily with a meal.  . Iron-Vitamins (GERITOL) LIQD Take by mouth daily.  . potassium chloride SA (K-DUR,KLOR-CON) 20 MEQ tablet Take 1 tablet (20 mEq total) by mouth 2 (two) times daily.  . timolol (TIMOPTIC) 0.25 % ophthalmic solution 1 drop daily.   . vitamin A 8000 UNIT capsule Take 8,000 Units by mouth daily.  Marland Kitchen  vitamin C (ASCORBIC ACID) 500 MG tablet Take 500 mg by mouth daily.  . vitamin E 400 UNIT capsule Take 400 Units by mouth daily.     Allergies:   Patient has no known allergies.   Social History   Socioeconomic History  . Marital status: Single    Spouse name: Not on file  . Number of children: 0  . Years of education: 81  . Highest education level: Not on file  Occupational History  . Occupation: SEAMSTRESS    Employer: [employer]  Tobacco Use  . Smoking status: Never Smoker  . Smokeless tobacco: Never  Used  Substance and Sexual Activity  . Alcohol use: Yes    Alcohol/week: 0.0 standard drinks    Comment: rare use  . Drug use: No  . Sexual activity: Yes    Partners: Male    Comment: 1st intercourse 68 yo-Fewer than 5 partners  Other Topics Concern  . Not on file  Social History Narrative   HSG, no college. Single - in a relationship. No children. 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   Social Determinants of Health   Financial Resource Strain:   . Difficulty of Paying Living Expenses: Not on file  Food Insecurity:   . Worried About Charity fundraiser in the Last Year: Not on file  . Ran Out of Food in the Last Year: Not on file  Transportation Needs:   . Lack of Transportation (Medical): Not on file  . Lack of Transportation (Non-Medical): Not on file  Physical Activity:   . Days of Exercise per Week: Not on file  . Minutes of Exercise per Session: Not on file  Stress:   . Feeling of Stress : Not on file  Social Connections:   . Frequency of Communication with Friends and Family: Not on file  . Frequency of Social Gatherings with Friends and Family: Not on file  . Attends Religious Services: Not on file  . Active Member of Clubs or Organizations: Not on file  . Attends Archivist Meetings: Not on file  . Marital Status: Not on file     Family History:  The patient's   family history includes Alcohol abuse in her brother and mother; Breast cancer in her cousin; Diabetes in her mother and sister; Hypertension in her father and sister.   ROS:   Please see the history of present illness.    ROS All other systems reviewed and are negative.   PHYSICAL EXAM:   VS:  BP 108/60   Pulse (!) 55   Ht 5\' 8"  (1.727 m)   Wt 145 lb 1.9 oz (65.8 kg)   SpO2 98%   BMI 22.07 kg/m   Physical Exam  GEN: Well nourished, well developed, in no acute distress  Neck: no JVD, carotid bruits, or masses Cardiac:RRR; no murmurs, rubs, or gallops  Respiratory:   clear to auscultation bilaterally, normal work of breathing GI: soft, nontender, nondistended, + BS Ext: without cyanosis, clubbing, or edema, Good distal pulses bilaterally Neuro:  Alert and Oriented x 3 Psych: euthymic mood, full affect  Wt Readings from Last 3 Encounters:  12/05/19 145 lb 1.9 oz (65.8 kg)  10/17/19 138 lb (62.6 kg)  08/01/19 137 lb (62.1 kg)      Studies/Labs Reviewed:   EKG:  EKG is not ordered today.    Recent Labs: 02/07/2019: BUN 19; Creatinine, Ser 0.92; Hemoglobin 12.6; Platelets 210.0; Potassium 3.7; Sodium 139  Lipid Panel    Component Value Date/Time   CHOL 175 09/28/2018 0925   TRIG 38.0 09/28/2018 0925   TRIG 30 10/25/2006 1012   HDL 64.90 09/28/2018 0925   CHOLHDL 3 09/28/2018 0925   VLDL 7.6 09/28/2018 0925   LDLCALC 102 (H) 09/28/2018 0925    Additional studies/ records that were reviewed today include:  Holter monitor 09/2019  Sinus bradycardia to sinus tachycardia.  Average heart rate 51 BPM.   Sinus bradycardia to sinus tachycardia. Frequent significant bradycardia in low 40' during awake hours. I would recommend to switch timolol to another medication. She should discuss with her ophthalmologist.     2-D echo 09/07/17   Study Conclusions   - Left ventricle: The cavity size was normal. Wall thickness was   normal. Systolic function was normal. The estimated ejection   fraction was in the range of 55% to 60%. Wall motion was normal;   there were no regional wall motion abnormalities. Doppler   parameters are consistent with abnormal left ventricular   relaxation (grade 1 diastolic dysfunction). - Pulmonary arteries: Systolic pressure was mildly increased.   Impressions:   - Normal LV systolic function; mild diastolic dysfunction; mild TR   with mildly elevated pulmonary pressure.            ASSESSMENT:    1. Dyspnea on exertion   2. Bradycardia, sinus   3. Essential hypertension   4. Hyperlipidemia with target LDL  less than 130      PLAN:  In order of problems listed above:   Dyspnea on exertion with exercise. Had normal GXT 10/2018, echo 2018 normal LVEF. She has baseline bradycardia and was started back on timolol -holter monitor sinus bradycardia in 40's during awake hours. Dr. Meda Coffee asked that her ophthalmologist stop timolol and change eye drops but he decreased them to once drop daily and she no longer has DOE.  Bradycardia felt secondary timolol in the past but because of high pressures her ophthalmologist has kept her on it but at a lower dose. HR staying 45-58-she checks it twice daily.  Essential hypertension controlled on chlorthalidone. Check surveillance labs  Hyperlipidemia LDL 102 09/2018 goal less than 90-will check today.   Medication Adjustments/Labs and Tests Ordered: Current medicines are reviewed at length with the patient today.  Concerns regarding medicines are outlined above.  Medication changes, Labs and Tests ordered today are listed in the Patient Instructions below. Patient Instructions  Medication Instructions:  Your physician recommends that you continue on your current medications as directed. Please refer to the Current Medication list given to you today.  *If you need a refill on your cardiac medications before your next appointment, please call your pharmacy*  Lab Work: TODAY: CBC, CMET, LIPIDS  If you have labs (blood work) drawn today and your tests are completely normal, you will receive your results only by: Marland Kitchen MyChart Message (if you have MyChart) OR . A paper copy in the mail If you have any lab test that is abnormal or we need to change your treatment, we will call you to review the results.  Testing/Procedures: None ordered  Follow-Up: At [facility], you and your health needs are our priority.  As part of our continuing mission to provide you with exceptional heart care, we have created designated Provider Care Teams.  These Care Teams include  your primary Cardiologist (physician) and Advanced Practice Providers (APPs -  Physician Assistants and Nurse Practitioners) who all work together to provide you  with the care you need, when you need it.  Your next appointment:   6 month(s)  The format for your next appointment:   In Person  Provider:   You may see Ena Dawley, MD or one of the following Advanced Practice Providers on your designated Care Team:    Melina Copa, PA-C  Susan Barrios, PA-C   Other Instructions      Signed, Susan Barrios, PA-C  12/05/2019 [address], [place], [place]  [address] Phone: [phone]; Fax: [phone]

## 2019-11-29 NOTE — Telephone Encounter (Signed)
Patient scheduled for 12/12/2019 at 9am and would like to know if PCP will refill until chlorthalidone (HYGROTON) 25 MG tablet until appt

## 2019-12-05 ENCOUNTER — Encounter: Payer: Self-pay | Admitting: Physician Assistant

## 2019-12-05 ENCOUNTER — Other Ambulatory Visit: Payer: Self-pay

## 2019-12-05 ENCOUNTER — Encounter (INDEPENDENT_AMBULATORY_CARE_PROVIDER_SITE_OTHER): Payer: Self-pay

## 2019-12-05 ENCOUNTER — Ambulatory Visit: Payer: PPO | Admitting: Physician Assistant

## 2019-12-05 VITALS — BP 108/60 | HR 55 | Ht 68.0 in | Wt 145.1 lb

## 2019-12-05 DIAGNOSIS — R0600 Dyspnea, unspecified: Secondary | ICD-10-CM

## 2019-12-05 DIAGNOSIS — I10 Essential (primary) hypertension: Secondary | ICD-10-CM

## 2019-12-05 DIAGNOSIS — E785 Hyperlipidemia, unspecified: Secondary | ICD-10-CM | POA: Diagnosis not present

## 2019-12-05 DIAGNOSIS — R001 Bradycardia, unspecified: Secondary | ICD-10-CM | POA: Diagnosis not present

## 2019-12-05 DIAGNOSIS — R0609 Other forms of dyspnea: Secondary | ICD-10-CM

## 2019-12-05 LAB — COMPREHENSIVE METABOLIC PANEL
ALT: 8 IU/L (ref 0–32)
AST: 19 IU/L (ref 0–40)
Albumin/Globulin Ratio: 1.2 (ref 1.2–2.2)
Albumin: 3.8 g/dL (ref 3.8–4.8)
Alkaline Phosphatase: 60 IU/L (ref 39–117)
BUN/Creatinine Ratio: 29 — ABNORMAL HIGH (ref 12–28)
BUN: 23 mg/dL (ref 8–27)
Bilirubin Total: 0.5 mg/dL (ref 0.0–1.2)
CO2: 26 mmol/L (ref 20–29)
Calcium: 9.2 mg/dL (ref 8.7–10.3)
Chloride: 101 mmol/L (ref 96–106)
Creatinine, Ser: 0.8 mg/dL (ref 0.57–1.00)
GFR calc Af Amer: 88 mL/min/{1.73_m2} (ref >59)
GFR calc non Af Amer: 76 mL/min/{1.73_m2} (ref >59)
Globulin, Total: 3.1 g/dL (ref 1.5–4.5)
Glucose: 95 mg/dL (ref 65–99)
Potassium: 3.8 mmol/L (ref 3.5–5.2)
Sodium: 140 mmol/L (ref 134–144)
Total Protein: 6.9 g/dL (ref 6.0–8.5)

## 2019-12-05 LAB — CBC
Hematocrit: 35.1 % (ref 34.0–46.6)
Hemoglobin: 11.5 g/dL (ref 11.1–15.9)
MCH: 27.4 pg (ref 26.6–33.0)
MCHC: 32.8 g/dL (ref 31.5–35.7)
MCV: 84 fL (ref 79–97)
Platelets: 211 10*3/uL (ref 150–450)
RBC: 4.19 x10E6/uL (ref 3.77–5.28)
RDW: 12 % (ref 11.7–15.4)
WBC: 3.2 10*3/uL — ABNORMAL LOW (ref 3.4–10.8)

## 2019-12-05 LAB — LIPID PANEL
Chol/HDL Ratio: 2.7 ratio (ref 0.0–4.4)
Cholesterol, Total: 183 mg/dL (ref 100–199)
HDL: 69 mg/dL (ref >39)
LDL Chol Calc (NIH): 106 mg/dL — ABNORMAL HIGH (ref 0–99)
Triglycerides: 37 mg/dL (ref 0–149)
VLDL Cholesterol Cal: 8 mg/dL (ref 5–40)

## 2019-12-05 NOTE — Patient Instructions (Signed)
Medication Instructions:  Your physician recommends that you continue on your current medications as directed. Please refer to the Current Medication list given to you today.  *If you need a refill on your cardiac medications before your next appointment, please call your pharmacy*  Lab Work: TODAY: CBC, CMET, LIPIDS  If you have labs (blood work) drawn today and your tests are completely normal, you will receive your results only by: Marland Kitchen MyChart Message (if you have MyChart) OR . A paper copy in the mail If you have any lab test that is abnormal or we need to change your treatment, we will call you to review the results.  Testing/Procedures: None ordered  Follow-Up: At [facility], you and your health needs are our priority.  As part of our continuing mission to provide you with exceptional heart care, we have created designated Provider Care Teams.  These Care Teams include your primary Cardiologist (physician) and Advanced Practice Providers (APPs -  Physician Assistants and Nurse Practitioners) who all work together to provide you with the care you need, when you need it.  Your next appointment:   6 month(s)  The format for your next appointment:   In Person  Provider:   You may see Ena Dawley, MD or one of the following Advanced Practice Providers on your designated Care Team:    Melina Copa, PA-C  Ermalinda Barrios, PA-C   Other Instructions

## 2019-12-12 ENCOUNTER — Ambulatory Visit: Payer: PPO | Admitting: Internal Medicine

## 2020-01-09 ENCOUNTER — Ambulatory Visit: Payer: PPO | Admitting: Internal Medicine

## 2020-01-16 ENCOUNTER — Ambulatory Visit (INDEPENDENT_AMBULATORY_CARE_PROVIDER_SITE_OTHER): Payer: PPO | Admitting: Internal Medicine

## 2020-01-16 ENCOUNTER — Encounter: Payer: Self-pay | Admitting: Internal Medicine

## 2020-01-16 ENCOUNTER — Other Ambulatory Visit: Payer: Self-pay

## 2020-01-16 VITALS — BP 136/58 | HR 53 | Temp 97.9°F | Resp 16 | Ht 68.0 in | Wt 147.0 lb

## 2020-01-16 DIAGNOSIS — E785 Hyperlipidemia, unspecified: Secondary | ICD-10-CM | POA: Diagnosis not present

## 2020-01-16 DIAGNOSIS — M8580 Other specified disorders of bone density and structure, unspecified site: Secondary | ICD-10-CM | POA: Diagnosis not present

## 2020-01-16 DIAGNOSIS — D513 Other dietary vitamin B12 deficiency anemia: Secondary | ICD-10-CM | POA: Diagnosis not present

## 2020-01-16 DIAGNOSIS — I10 Essential (primary) hypertension: Secondary | ICD-10-CM | POA: Diagnosis not present

## 2020-01-16 DIAGNOSIS — Z0000 Encounter for general adult medical examination without abnormal findings: Secondary | ICD-10-CM | POA: Diagnosis not present

## 2020-01-16 DIAGNOSIS — E559 Vitamin D deficiency, unspecified: Secondary | ICD-10-CM | POA: Diagnosis not present

## 2020-01-16 DIAGNOSIS — D508 Other iron deficiency anemias: Secondary | ICD-10-CM | POA: Diagnosis not present

## 2020-01-16 LAB — IBC PANEL
Iron: 80 ug/dL (ref 42–145)
Saturation Ratios: 28 % (ref 20.0–50.0)
Transferrin: 204 mg/dL — ABNORMAL LOW (ref 212.0–360.0)

## 2020-01-16 LAB — VITAMIN D 25 HYDROXY (VIT D DEFICIENCY, FRACTURES): VITD: 24.12 ng/mL — ABNORMAL LOW (ref 30.00–100.00)

## 2020-01-16 LAB — TSH: TSH: 1.34 u[IU]/mL (ref 0.35–4.50)

## 2020-01-16 LAB — FERRITIN: Ferritin: 217.5 ng/mL (ref 10.0–291.0)

## 2020-01-16 LAB — FOLATE: Folate: 20 ng/mL (ref >5.9)

## 2020-01-16 LAB — VITAMIN B12: Vitamin B-12: 365 pg/mL (ref 211–911)

## 2020-01-16 MED ORDER — VITAMIN D3 50 MCG (2000 UT) PO CAPS: 4000.0000 [IU] | ORAL_CAPSULE | Freq: Every day | ORAL | 1 refills | Status: DC

## 2020-01-16 NOTE — Patient Instructions (Signed)
Health Maintenance, Female Adopting a healthy lifestyle and getting preventive care are important in promoting health and wellness. Ask your health care provider about:  The right schedule for you to have regular tests and exams.  Things you can do on your own to prevent diseases and keep yourself healthy. What should I know about diet, weight, and exercise? Eat a healthy diet   Eat a diet that includes plenty of vegetables, fruits, low-fat dairy products, and lean protein.  Do not eat a lot of foods that are high in solid fats, added sugars, or sodium. Maintain a healthy weight Body mass index (BMI) is used to identify weight problems. It estimates body fat based on height and weight. Your health care provider can help determine your BMI and help you achieve or maintain a healthy weight. Get regular exercise Get regular exercise. This is one of the most important things you can do for your health. Most adults should:  Exercise for at least 150 minutes each week. The exercise should increase your heart rate and make you sweat (moderate-intensity exercise).  Do strengthening exercises at least twice a week. This is in addition to the moderate-intensity exercise.  Spend less time sitting. Even light physical activity can be beneficial. Watch cholesterol and blood lipids Have your blood tested for lipids and cholesterol at 69 years of age, then have this test every 5 years. Have your cholesterol levels checked more often if:  Your lipid or cholesterol levels are high.  You are older than 69 years of age.  You are at high risk for heart disease. What should I know about cancer screening? Depending on your health history and family history, you may need to have cancer screening at various ages. This may include screening for:  Breast cancer.  Cervical cancer.  Colorectal cancer.  Skin cancer.  Lung cancer. What should I know about heart disease, diabetes, and high blood  pressure? Blood pressure and heart disease  High blood pressure causes heart disease and increases the risk of stroke. This is more likely to develop in people who have high blood pressure readings, are of African descent, or are overweight.  Have your blood pressure checked: ? Every 3-5 years if you are 18-39 years of age. ? Every year if you are 40 years old or older. Diabetes Have regular diabetes screenings. This checks your fasting blood sugar level. Have the screening done:  Once every three years after age 40 if you are at a normal weight and have a low risk for diabetes.  More often and at a younger age if you are overweight or have a high risk for diabetes. What should I know about preventing infection? Hepatitis B If you have a higher risk for hepatitis B, you should be screened for this virus. Talk with your health care provider to find out if you are at risk for hepatitis B infection. Hepatitis C Testing is recommended for:  Everyone born from 1945 through 1965.  Anyone with known risk factors for hepatitis C. Sexually transmitted infections (STIs)  Get screened for STIs, including gonorrhea and chlamydia, if: ? You are sexually active and are younger than 69 years of age. ? You are older than 69 years of age and your health care provider tells you that you are at risk for this type of infection. ? Your sexual activity has changed since you were last screened, and you are at increased risk for chlamydia or gonorrhea. Ask your health care provider if   you are at risk.  Ask your health care provider about whether you are at high risk for HIV. Your health care provider may recommend a prescription medicine to help prevent HIV infection. If you choose to take medicine to prevent HIV, you should first get tested for HIV. You should then be tested every 3 months for as long as you are taking the medicine. Pregnancy  If you are about to stop having your period (premenopausal) and  you may become pregnant, seek counseling before you get pregnant.  Take 400 to 800 micrograms (mcg) of folic acid every day if you become pregnant.  Ask for birth control (contraception) if you want to prevent pregnancy. Osteoporosis and menopause Osteoporosis is a disease in which the bones lose minerals and strength with aging. This can result in bone fractures. If you are 65 years old or older, or if you are at risk for osteoporosis and fractures, ask your health care provider if you should:  Be screened for bone loss.  Take a calcium or vitamin D supplement to lower your risk of fractures.  Be given hormone replacement therapy (HRT) to treat symptoms of menopause. Follow these instructions at home: Lifestyle  Do not use any products that contain nicotine or tobacco, such as cigarettes, e-cigarettes, and chewing tobacco. If you need help quitting, ask your health care provider.  Do not use street drugs.  Do not share needles.  Ask your health care provider for help if you need support or information about quitting drugs. Alcohol use  Do not drink alcohol if: ? Your health care provider tells you not to drink. ? You are pregnant, may be pregnant, or are planning to become pregnant.  If you drink alcohol: ? Limit how much you use to 0-1 drink a day. ? Limit intake if you are breastfeeding.  Be aware of how much alcohol is in your drink. In the [place] one drink equals one 12 oz bottle of beer (355 mL), one 5 oz glass of wine (148 mL), or one 1 oz glass of hard liquor (44 mL). General instructions  Schedule regular health, dental, and eye exams.  Stay current with your vaccines.  Tell your health care provider if: ? You often feel depressed. ? You have ever been abused or do not feel safe at home. Summary  Adopting a healthy lifestyle and getting preventive care are important in promoting health and wellness.  Follow your health care provider's instructions about healthy  diet, exercising, and getting tested or screened for diseases.  Follow your health care provider's instructions on monitoring your cholesterol and blood pressure. This information is not intended to replace advice given to you by your health care provider. Make sure you discuss any questions you have with your health care provider. Document Revised: 11/30/2018 Document Reviewed: 11/30/2018 Elsevier Patient Education  2020 [employer]  

## 2020-01-16 NOTE — Progress Notes (Signed)
Subjective:  Patient ID: Susan Mccarty, female    DOB: 1951/06/17  Age: 69 y.o. MRN: [id number]  CC: Annual Exam and Hypertension  This visit occurred during the SARS-CoV-2 public health emergency.  Safety protocols were in place, including screening questions prior to the visit, additional usage of staff PPE, and extensive cleaning of exam room while observing appropriate contact time as indicated for disinfecting solutions.   HPI [name] presents for a CPX.  She continues to complain of fatigue.  The low heart rate does not bother her.  She has undergone a Holter monitor and an exercise tolerance test with cardiology.  She exercises and has good endurance.  She denies any recent episodes of dizziness, lightheadedness, near syncope, palpitations, or edema.  She tells me her blood pressure has been well controlled.  Outpatient Medications Prior to Visit  Medication Sig Dispense Refill  . alendronate (FOSAMAX) 70 MG tablet Take 1 tablet (70 mg total) by mouth every 7 (seven) days. Take with a full glass of water on an empty stomach. 4 tablet 11  . Brinzolamide-Brimonidine (SIMBRINZA) 1-0.2 % SUSP Apply to eye.    . chlorthalidone (HYGROTON) 25 MG tablet Take 1 tablet (25 mg total) by mouth daily. 90 tablet 0  . Crisaborole (EUCRISA) 2 % OINT Apply 1 Act topically 2 (two) times daily. 60 g 3  . cyanocobalamin 2000 MCG tablet Take 1 tablet (2,000 mcg total) by mouth daily. 90 tablet 3  . ferrous sulfate 220 (44 Fe) MG/5ML solution Take 5 mLs (220 mg total) by mouth 2 (two) times daily with a meal. 473 mL 5  . Iron-Vitamins (GERITOL) LIQD Take by mouth daily.    . potassium chloride SA (K-DUR,KLOR-CON) 20 MEQ tablet Take 1 tablet (20 mEq total) by mouth 2 (two) times daily. 180 tablet 1  . timolol (TIMOPTIC) 0.25 % ophthalmic solution 1 drop daily.     . vitamin A 8000 UNIT capsule Take 8,000 Units by mouth daily.    . vitamin C (ASCORBIC ACID) 500 MG tablet Take 500 mg by mouth daily.      . vitamin E 400 UNIT capsule Take 400 Units by mouth daily.    . Cholecalciferol (VITAMIN D3) 2000 units capsule Take 2 capsules (4,000 Units total) by mouth daily. 180 capsule 1   No facility-administered medications prior to visit.    ROS Review of Systems  Constitutional: Positive for fatigue. Negative for appetite change, diaphoresis and unexpected weight change.  HENT: Negative.   Eyes: Negative.   Respiratory: Negative for cough, chest tightness, shortness of breath and wheezing.   Cardiovascular: Negative for chest pain, palpitations and leg swelling.  Gastrointestinal: Negative for abdominal pain, blood in stool, constipation, diarrhea and vomiting.  Endocrine: Negative.  Negative for cold intolerance and heat intolerance.  Genitourinary: Negative.  Negative for difficulty urinating.  Musculoskeletal: Negative.  Negative for arthralgias and myalgias.  Skin: Negative.  Negative for color change.  Neurological: Negative.  Negative for dizziness, weakness, light-headedness and headaches.  Hematological: Negative for adenopathy. Does not bruise/bleed easily.  Psychiatric/Behavioral: Negative.     Objective:  BP (!) 136/58 (BP Location: Left Arm, Patient Position: Sitting, Cuff Size: Normal)   Pulse (!) 53   Temp 97.9 F (36.6 C) (Oral)   Resp 16   Ht 5\' 8"  (1.727 m)   Wt 147 lb (66.7 kg)   SpO2 98%   BMI 22.35 kg/m   BP Readings from Last 3 Encounters:  01/16/20 Marland Kitchen)  136/58  12/05/19 108/60  10/17/19 128/70    Wt Readings from Last 3 Encounters:  01/16/20 147 lb (66.7 kg)  12/05/19 145 lb 1.9 oz (65.8 kg)  10/17/19 138 lb (62.6 kg)    Physical Exam Vitals reviewed.  Constitutional:      Appearance: Normal appearance.  HENT:     Nose: Nose normal.     Mouth/Throat:     Mouth: Mucous membranes are moist.  Eyes:     General: No scleral icterus.    Conjunctiva/sclera: Conjunctivae normal.  Cardiovascular:     Rate and Rhythm: Regular rhythm. Bradycardia  present.     Heart sounds: Normal heart sounds, S1 normal and S2 normal.  Pulmonary:     Effort: Pulmonary effort is normal.     Breath sounds: No stridor. No wheezing, rhonchi or rales.  Abdominal:     General: Abdomen is flat. Bowel sounds are normal. There is no distension.     Palpations: Abdomen is soft. There is no hepatomegaly or splenomegaly.     Tenderness: There is no abdominal tenderness.  Musculoskeletal:     Cervical back: Neck supple.     Right lower leg: No edema.     Left lower leg: No edema.  Lymphadenopathy:     Cervical: No cervical adenopathy.  Skin:    General: Skin is warm.     Findings: No lesion or rash.  Neurological:     General: No focal deficit present.     Mental Status: She is alert and oriented to person, place, and time. Mental status is at baseline.     Lab Results  Component Value Date   WBC 3.2 (L) 12/05/2019   HGB 11.5 12/05/2019   HCT 35.1 12/05/2019   PLT 211 12/05/2019   GLUCOSE 95 12/05/2019   CHOL 183 12/05/2019   TRIG 37 12/05/2019   HDL 69 12/05/2019   LDLCALC 106 (H) 12/05/2019   ALT 8 12/05/2019   AST 19 12/05/2019   NA 140 12/05/2019   K 3.8 12/05/2019   CL 101 12/05/2019   CREATININE 0.80 12/05/2019   BUN 23 12/05/2019   CO2 26 12/05/2019   TSH 1.34 01/16/2020    No results found.  Assessment & Plan:   Sofiah was seen today for annual exam and hypertension.  Diagnoses and all orders for this visit:  Essential hypertension- Her blood pressure is adequately well controlled.  Will continue the current dose of chlorthalidone. -     TSH  Vitamin D deficiency -     VITAMIN D 25 Hydroxy (Vit-D Deficiency, Fractures) -     Cholecalciferol (VITAMIN D3) 50 MCG (2000 UT) capsule; Take 2 capsules (4,000 Units total) by mouth daily.  Routine general medical examination at a health care facility- Exam completed, labs reviewed, she refused vaccines against influenza and pneumonia, screening for breast/cervical/colon cancer  all up-to-date, patient education was given.  Other dietary vitamin B12 deficiency anemia- Her H&H and B12 level are normal now. -     Vitamin B12 -     Folate  Iron deficiency anemia secondary to inadequate dietary iron intake- Her H&H and iron levels are normal now. -     IBC panel -     Ferritin  Hyperlipidemia with target LDL less than 130- She has a mildly elevated ASCVD risk score but is not willing to take a statin for CV risk reduction. -     TSH  Osteopenia, unspecified location -  Cholecalciferol (VITAMIN D3) 50 MCG (2000 UT) capsule; Take 2 capsules (4,000 Units total) by mouth daily.   I have changed Silvano Rusk. Wiatrek's Vitamin D3. I am also having her maintain her vitamin E, vitamin A, vitamin C, Geritol, potassium chloride SA, cyanocobalamin, ferrous sulfate, Brinzolamide-Brimonidine, alendronate, Crisaborole, timolol, and chlorthalidone.  Meds ordered this encounter  Medications  . Cholecalciferol (VITAMIN D3) 50 MCG (2000 UT) capsule    Sig: Take 2 capsules (4,000 Units total) by mouth daily.    Dispense:  180 capsule    Refill:  1     Follow-up: Return in about 6 months (around 07/15/2020).  Scarlette Calico, MD

## 2020-02-27 DIAGNOSIS — H25813 Combined forms of age-related cataract, bilateral: Secondary | ICD-10-CM | POA: Diagnosis not present

## 2020-02-27 DIAGNOSIS — H401123 Primary open-angle glaucoma, left eye, severe stage: Secondary | ICD-10-CM | POA: Diagnosis not present

## 2020-02-27 DIAGNOSIS — H401112 Primary open-angle glaucoma, right eye, moderate stage: Secondary | ICD-10-CM | POA: Diagnosis not present

## 2020-03-01 ENCOUNTER — Other Ambulatory Visit: Payer: PPO

## 2020-03-08 ENCOUNTER — Other Ambulatory Visit: Payer: PPO

## 2020-03-13 ENCOUNTER — Other Ambulatory Visit: Payer: PPO

## 2020-03-14 ENCOUNTER — Other Ambulatory Visit: Payer: Self-pay | Admitting: Internal Medicine

## 2020-03-14 DIAGNOSIS — I10 Essential (primary) hypertension: Secondary | ICD-10-CM

## 2020-03-20 ENCOUNTER — Telehealth: Payer: Self-pay

## 2020-03-20 NOTE — Telephone Encounter (Signed)
New message   The patient voiced she has decided to take the COVID vaccine shot. Asking for a call back after 11:00 am.   The patient is aware the MD and CMA are in the clinic today seeing patients.  COVID vaccine appt has not been made yet   The patient voiced having an upcoming tooth removal given 500mg  antibiotic appt on 4.12.2021, any precaution with getting the vaccine.   Please advise

## 2020-03-20 NOTE — Telephone Encounter (Signed)
Pt contacted and she wanted to make sure there would not be an issue to take an abx and then get the vaccine. Informed the patient to air on the side of caution and wait a week or two. Pt stated understanding.

## 2020-03-22 ENCOUNTER — Ambulatory Visit: Payer: PPO | Attending: Internal Medicine

## 2020-03-22 DIAGNOSIS — Z20822 Contact with and (suspected) exposure to covid-19: Secondary | ICD-10-CM | POA: Diagnosis not present

## 2020-03-23 LAB — SARS-COV-2, NAA 2 DAY TAT

## 2020-03-23 LAB — NOVEL CORONAVIRUS, NAA: SARS-CoV-2, NAA: NOT DETECTED

## 2020-04-04 ENCOUNTER — Telehealth: Payer: Self-pay

## 2020-04-04 NOTE — Telephone Encounter (Signed)
Agent called stating that patient needed COVID-19 result. Patient hung up before transfer to me.

## 2020-07-09 DIAGNOSIS — Z1231 Encounter for screening mammogram for malignant neoplasm of breast: Secondary | ICD-10-CM | POA: Diagnosis not present

## 2020-07-16 DIAGNOSIS — H401112 Primary open-angle glaucoma, right eye, moderate stage: Secondary | ICD-10-CM | POA: Diagnosis not present

## 2020-07-16 DIAGNOSIS — H25813 Combined forms of age-related cataract, bilateral: Secondary | ICD-10-CM | POA: Diagnosis not present

## 2020-07-16 DIAGNOSIS — H401123 Primary open-angle glaucoma, left eye, severe stage: Secondary | ICD-10-CM | POA: Diagnosis not present

## 2020-08-02 ENCOUNTER — Other Ambulatory Visit: Payer: Self-pay | Admitting: Internal Medicine

## 2020-08-02 DIAGNOSIS — I10 Essential (primary) hypertension: Secondary | ICD-10-CM

## 2020-08-06 ENCOUNTER — Other Ambulatory Visit: Payer: Self-pay

## 2020-08-06 ENCOUNTER — Encounter: Payer: Self-pay | Admitting: Internal Medicine

## 2020-08-06 ENCOUNTER — Ambulatory Visit (INDEPENDENT_AMBULATORY_CARE_PROVIDER_SITE_OTHER): Payer: PPO | Admitting: Internal Medicine

## 2020-08-06 VITALS — BP 138/74 | HR 66 | Temp 98.3°F | Resp 16 | Ht 68.0 in | Wt 139.0 lb

## 2020-08-06 DIAGNOSIS — E559 Vitamin D deficiency, unspecified: Secondary | ICD-10-CM

## 2020-08-06 DIAGNOSIS — Z23 Encounter for immunization: Secondary | ICD-10-CM

## 2020-08-06 DIAGNOSIS — R001 Bradycardia, unspecified: Secondary | ICD-10-CM

## 2020-08-06 DIAGNOSIS — I10 Essential (primary) hypertension: Secondary | ICD-10-CM | POA: Diagnosis not present

## 2020-08-06 LAB — BASIC METABOLIC PANEL WITH GFR
BUN: 18 mg/dL (ref 7–25)
CO2: 32 mmol/L (ref 20–32)
Calcium: 9.3 mg/dL (ref 8.6–10.4)
Chloride: 102 mmol/L (ref 98–110)
Creat: 0.81 mg/dL (ref 0.50–0.99)
GFR, Est African American: 86 mL/min/{1.73_m2} (ref >=60)
GFR, Est Non African American: 75 mL/min/{1.73_m2} (ref >=60)
Glucose, Bld: 91 mg/dL (ref 65–99)
Potassium: 3.9 mmol/L (ref 3.5–5.3)
Sodium: 138 mmol/L (ref 135–146)

## 2020-08-06 LAB — VITAMIN D 25 HYDROXY (VIT D DEFICIENCY, FRACTURES): Vit D, 25-Hydroxy: 24 ng/mL — ABNORMAL LOW (ref 30–100)

## 2020-08-06 NOTE — Patient Instructions (Signed)

## 2020-08-06 NOTE — Progress Notes (Signed)
Subjective:  Patient ID: Susan Mccarty, female    DOB: 04/12/1951  Age: 69 y.o. MRN: [id number]  CC: Hypertension  This visit occurred during the SARS-CoV-2 public health emergency.  Safety protocols were in place, including screening questions prior to the visit, additional usage of staff PPE, and extensive cleaning of exam room while observing appropriate contact time as indicated for disinfecting solutions.    HPI Susan Mccarty presents for f/up - She reports that her blood pressure has been well controlled.  She is active and denies any recent episodes of headache, blurred vision, chest pain, shortness of breath, palpitations, edema, or fatigue.  Outpatient Medications Prior to Visit  Medication Sig Dispense Refill  . Brinzolamide-Brimonidine (SIMBRINZA) 1-0.2 % SUSP Apply to eye.    . chlorthalidone (HYGROTON) 25 MG tablet TAKE 1 TABLET(25 MG) BY MOUTH DAILY 90 tablet 0  . Cholecalciferol (VITAMIN D3) 50 MCG (2000 UT) capsule Take 2 capsules (4,000 Units total) by mouth daily. 180 capsule 1  . Crisaborole (EUCRISA) 2 % OINT Apply 1 Act topically 2 (two) times daily. 60 g 3  . cyanocobalamin 2000 MCG tablet Take 1 tablet (2,000 mcg total) by mouth daily. 90 tablet 3  . dorzolamide (TRUSOPT) 2 % ophthalmic solution 1 drop 2 (two) times daily.    . ferrous sulfate 220 (44 Fe) MG/5ML solution Take 5 mLs (220 mg total) by mouth 2 (two) times daily with a meal. 473 mL 5  . Iron-Vitamins (GERITOL) LIQD Take by mouth daily.    . potassium chloride SA (K-DUR,KLOR-CON) 20 MEQ tablet Take 1 tablet (20 mEq total) by mouth 2 (two) times daily. 180 tablet 1  . timolol (TIMOPTIC) 0.25 % ophthalmic solution 1 drop daily.     . vitamin A 8000 UNIT capsule Take 8,000 Units by mouth daily.    . vitamin C (ASCORBIC ACID) 500 MG tablet Take 500 mg by mouth daily.    . vitamin E 400 UNIT capsule Take 400 Units by mouth daily.    Susan Mccarty Kitchen alendronate (FOSAMAX) 70 MG tablet Take 1 tablet (70 mg total) by mouth  every 7 (seven) days. Take with a full glass of water on an empty stomach. 4 tablet 11   No facility-administered medications prior to visit.    ROS Review of Systems  Constitutional: Negative for appetite change, diaphoresis, fatigue and unexpected weight change.  HENT: Negative.   Eyes: Negative for visual disturbance.  Respiratory: Negative for cough, chest tightness, shortness of breath and wheezing.   Cardiovascular: Negative for chest pain, palpitations and leg swelling.  Gastrointestinal: Negative for abdominal pain, constipation, diarrhea, nausea and vomiting.  Endocrine: Negative.  Negative for cold intolerance and heat intolerance.  Genitourinary: Negative.   Musculoskeletal: Negative for arthralgias and myalgias.  Skin: Negative.   Neurological: Negative.  Negative for dizziness, syncope, weakness and light-headedness.  Hematological: Negative for adenopathy. Does not bruise/bleed easily.  Psychiatric/Behavioral: Negative.     Objective:  BP 138/74 (BP Location: Left Arm, Patient Position: Sitting, Cuff Size: Normal)   Pulse 66   Temp 98.3 F (36.8 C) (Oral)   Resp 16   Ht 5\' 8"  (1.727 m)   Wt 139 lb (63 kg)   SpO2 96%   BMI 21.13 kg/m   BP Readings from Last 3 Encounters:  08/06/20 138/74  01/16/20 (!) 136/58  12/05/19 108/60    Wt Readings from Last 3 Encounters:  08/06/20 139 lb (63 kg)  01/16/20 147 lb (66.7 kg)  12/05/19 145  lb 1.9 oz (65.8 kg)    Physical Exam Vitals reviewed.  Constitutional:      Appearance: Normal appearance.  HENT:     Nose: Nose normal.     Mouth/Throat:     Mouth: Mucous membranes are moist.  Eyes:     General: No scleral icterus.    Conjunctiva/sclera: Conjunctivae normal.  Cardiovascular:     Rate and Rhythm: Normal rate and regular rhythm.     Heart sounds: No murmur heard.   Pulmonary:     Effort: Pulmonary effort is normal.     Breath sounds: No stridor. No wheezing, rhonchi or rales.  Abdominal:      General: Abdomen is flat.     Palpations: There is no mass.     Tenderness: There is no abdominal tenderness. There is no guarding.  Musculoskeletal:        General: Normal range of motion.     Cervical back: Neck supple.     Right lower leg: No edema.     Left lower leg: No edema.  Lymphadenopathy:     Cervical: No cervical adenopathy.  Skin:    General: Skin is warm and dry.     Coloration: Skin is not pale.  Neurological:     General: No focal deficit present.     Mental Status: She is alert.  Psychiatric:        Mood and Affect: Mood normal.        Behavior: Behavior normal.     Lab Results  Component Value Date   WBC 3.2 (L) 12/05/2019   HGB 11.5 12/05/2019   HCT 35.1 12/05/2019   PLT 211 12/05/2019   GLUCOSE 91 08/06/2020   CHOL 183 12/05/2019   TRIG 37 12/05/2019   HDL 69 12/05/2019   LDLCALC 106 (H) 12/05/2019   ALT 8 12/05/2019   AST 19 12/05/2019   NA 138 08/06/2020   K 3.9 08/06/2020   CL 102 08/06/2020   CREATININE 0.81 08/06/2020   BUN 18 08/06/2020   CO2 32 08/06/2020   TSH 1.34 01/16/2020    No results found.  Assessment & Plan:   Susan Mccarty was seen today for hypertension.  Diagnoses and all orders for this visit:  Essential hypertension- Her blood pressure is well controlled.  Electrolytes and renal function are normal. -     BASIC METABOLIC PANEL WITH GFR; Future -     BASIC METABOLIC PANEL WITH GFR  Vitamin D deficiency- Her vitamin D level is normal now. -     VITAMIN D 25 Hydroxy (Vit-D Deficiency, Fractures); Future -     VITAMIN D 25 Hydroxy (Vit-D Deficiency, Fractures)  Need for Tdap vaccination -     Tdap vaccine greater than or equal to 7yo IM  Bradycardia, sinus- Her heart rate is normal now and she is asymptomatic with this.   I have discontinued Susan Mccarty. Susan Mccarty's alendronate. I am also having her maintain her vitamin E, vitamin A, vitamin C, Geritol, potassium chloride SA, cyanocobalamin, ferrous sulfate,  Brinzolamide-Brimonidine, Crisaborole, timolol, Vitamin D3, chlorthalidone, and dorzolamide.  No orders of the defined types were placed in this encounter.    Follow-up: Return in about 6 months (around 02/06/2021).  Susan Calico, MD

## 2020-08-08 ENCOUNTER — Encounter: Payer: Self-pay | Admitting: Internal Medicine

## 2020-08-15 ENCOUNTER — Telehealth: Payer: Self-pay

## 2020-08-15 DIAGNOSIS — E559 Vitamin D deficiency, unspecified: Secondary | ICD-10-CM

## 2020-08-15 DIAGNOSIS — M8580 Other specified disorders of bone density and structure, unspecified site: Secondary | ICD-10-CM

## 2020-08-15 NOTE — Telephone Encounter (Signed)
New message    The patient is asking for a call back on labs results does not understand what it all means.    Please advise

## 2020-08-16 MED ORDER — VITAMIN D3 50 MCG (2000 UT) PO CAPS: 8000.0000 [IU] | ORAL_CAPSULE | Freq: Every day | ORAL | 1 refills | Status: DC

## 2020-08-16 NOTE — Telephone Encounter (Signed)
Contacted pt and informed that her vitamin d was low. Pt stated that she is taking a vitamin d supplement.   Pt wanted to know if she need a higher dose/strength of vitamin d.   Please advise.

## 2020-08-16 NOTE — Telephone Encounter (Signed)
Pt contacted and informed (per PCP) to double the amount of vitamin d. Pt stated understanding and will start doing that. Updating medication list to reflect the same.

## 2020-11-13 DIAGNOSIS — H401112 Primary open-angle glaucoma, right eye, moderate stage: Secondary | ICD-10-CM | POA: Diagnosis not present

## 2020-11-13 DIAGNOSIS — H25813 Combined forms of age-related cataract, bilateral: Secondary | ICD-10-CM | POA: Diagnosis not present

## 2020-11-13 DIAGNOSIS — H401123 Primary open-angle glaucoma, left eye, severe stage: Secondary | ICD-10-CM | POA: Diagnosis not present

## 2020-12-22 ENCOUNTER — Other Ambulatory Visit: Payer: Self-pay | Admitting: Internal Medicine

## 2020-12-22 DIAGNOSIS — I10 Essential (primary) hypertension: Secondary | ICD-10-CM

## 2021-01-31 ENCOUNTER — Telehealth: Payer: Self-pay | Admitting: Internal Medicine

## 2021-01-31 NOTE — Telephone Encounter (Signed)
   Called patient to schedule CPE/AWV patient states she didn't have a calendar at the moment and would call back to schedule

## 2021-02-11 ENCOUNTER — Ambulatory Visit: Payer: PPO | Admitting: Internal Medicine

## 2021-02-11 DIAGNOSIS — Z0289 Encounter for other administrative examinations: Secondary | ICD-10-CM

## 2021-03-04 DIAGNOSIS — H401112 Primary open-angle glaucoma, right eye, moderate stage: Secondary | ICD-10-CM | POA: Diagnosis not present

## 2021-03-04 DIAGNOSIS — H25813 Combined forms of age-related cataract, bilateral: Secondary | ICD-10-CM | POA: Diagnosis not present

## 2021-03-04 DIAGNOSIS — H401123 Primary open-angle glaucoma, left eye, severe stage: Secondary | ICD-10-CM | POA: Diagnosis not present

## 2021-04-01 ENCOUNTER — Encounter: Payer: PPO | Admitting: Internal Medicine

## 2021-05-07 ENCOUNTER — Other Ambulatory Visit: Payer: Self-pay

## 2021-05-07 ENCOUNTER — Encounter: Payer: Self-pay | Admitting: Internal Medicine

## 2021-05-07 ENCOUNTER — Ambulatory Visit (INDEPENDENT_AMBULATORY_CARE_PROVIDER_SITE_OTHER): Payer: PPO | Admitting: Internal Medicine

## 2021-05-07 VITALS — BP 128/78 | HR 51 | Temp 98.6°F | Resp 16 | Ht 68.0 in | Wt 137.0 lb

## 2021-05-07 DIAGNOSIS — D513 Other dietary vitamin B12 deficiency anemia: Secondary | ICD-10-CM | POA: Diagnosis not present

## 2021-05-07 DIAGNOSIS — D508 Other iron deficiency anemias: Secondary | ICD-10-CM | POA: Diagnosis not present

## 2021-05-07 DIAGNOSIS — E559 Vitamin D deficiency, unspecified: Secondary | ICD-10-CM

## 2021-05-07 DIAGNOSIS — Z0000 Encounter for general adult medical examination without abnormal findings: Secondary | ICD-10-CM

## 2021-05-07 DIAGNOSIS — Z1231 Encounter for screening mammogram for malignant neoplasm of breast: Secondary | ICD-10-CM

## 2021-05-07 DIAGNOSIS — R001 Bradycardia, unspecified: Secondary | ICD-10-CM

## 2021-05-07 DIAGNOSIS — N898 Other specified noninflammatory disorders of vagina: Secondary | ICD-10-CM | POA: Diagnosis not present

## 2021-05-07 DIAGNOSIS — E785 Hyperlipidemia, unspecified: Secondary | ICD-10-CM | POA: Diagnosis not present

## 2021-05-07 DIAGNOSIS — I10 Essential (primary) hypertension: Secondary | ICD-10-CM | POA: Diagnosis not present

## 2021-05-07 LAB — HEPATIC FUNCTION PANEL
ALT: 9 U/L (ref 0–35)
AST: 21 U/L (ref 0–37)
Albumin: 4 g/dL (ref 3.5–5.2)
Alkaline Phosphatase: 44 U/L (ref 39–117)
Bilirubin, Direct: 0.1 mg/dL (ref 0.0–0.3)
Total Bilirubin: 0.7 mg/dL (ref 0.2–1.2)
Total Protein: 7.4 g/dL (ref 6.0–8.3)

## 2021-05-07 LAB — CBC WITH DIFFERENTIAL/PLATELET
Basophils Absolute: 0 10*3/uL (ref 0.0–0.1)
Basophils Relative: 0.7 % (ref 0.0–3.0)
Eosinophils Absolute: 0.1 10*3/uL (ref 0.0–0.7)
Eosinophils Relative: 3.9 % (ref 0.0–5.0)
HCT: 35.5 % — ABNORMAL LOW (ref 36.0–46.0)
Hemoglobin: 11.8 g/dL — ABNORMAL LOW (ref 12.0–15.0)
Lymphocytes Relative: 42.4 % (ref 12.0–46.0)
Lymphs Abs: 1.1 10*3/uL (ref 0.7–4.0)
MCHC: 33.2 g/dL (ref 30.0–36.0)
MCV: 81.8 fl (ref 78.0–100.0)
Monocytes Absolute: 0.4 10*3/uL (ref 0.1–1.0)
Monocytes Relative: 14.4 % — ABNORMAL HIGH (ref 3.0–12.0)
Neutro Abs: 1 10*3/uL — ABNORMAL LOW (ref 1.4–7.7)
Neutrophils Relative %: 38.6 % — ABNORMAL LOW (ref 43.0–77.0)
Platelets: 185 10*3/uL (ref 150.0–400.0)
RBC: 4.34 Mil/uL (ref 3.87–5.11)
RDW: 13 % (ref 11.5–15.5)
WBC: 2.7 10*3/uL — ABNORMAL LOW (ref 4.0–10.5)

## 2021-05-07 LAB — VITAMIN D 25 HYDROXY (VIT D DEFICIENCY, FRACTURES): VITD: 36.84 ng/mL (ref 30.00–100.00)

## 2021-05-07 LAB — LIPID PANEL
Cholesterol: 183 mg/dL (ref 0–200)
HDL: 64.7 mg/dL (ref >39.00)
LDL Cholesterol: 108 mg/dL — ABNORMAL HIGH (ref 0–99)
NonHDL: 118.22
Total CHOL/HDL Ratio: 3
Triglycerides: 50 mg/dL (ref 0.0–149.0)
VLDL: 10 mg/dL (ref 0.0–40.0)

## 2021-05-07 LAB — VITAMIN B12: Vitamin B-12: 381 pg/mL (ref 211–911)

## 2021-05-07 LAB — IRON: Iron: 64 ug/dL (ref 42–145)

## 2021-05-07 LAB — FERRITIN: Ferritin: 237.3 ng/mL (ref 10.0–291.0)

## 2021-05-07 LAB — FOLATE: Folate: 21.2 ng/mL (ref >5.9)

## 2021-05-07 NOTE — Patient Instructions (Signed)
Health Maintenance, Female Adopting a healthy lifestyle and getting preventive care are important in promoting health and wellness. Ask your health care provider about:  The right schedule for you to have regular tests and exams.  Things you can do on your own to prevent diseases and keep yourself healthy. What should I know about diet, weight, and exercise? Eat a healthy diet  Eat a diet that includes plenty of vegetables, fruits, low-fat dairy products, and lean protein.  Do not eat a lot of foods that are high in solid fats, added sugars, or sodium.   Maintain a healthy weight Body mass index (BMI) is used to identify weight problems. It estimates body fat based on height and weight. Your health care provider can help determine your BMI and help you achieve or maintain a healthy weight. Get regular exercise Get regular exercise. This is one of the most important things you can do for your health. Most adults should:  Exercise for at least 150 minutes each week. The exercise should increase your heart rate and make you sweat (moderate-intensity exercise).  Do strengthening exercises at least twice a week. This is in addition to the moderate-intensity exercise.  Spend less time sitting. Even light physical activity can be beneficial. Watch cholesterol and blood lipids Have your blood tested for lipids and cholesterol at 70 years of age, then have this test every 5 years. Have your cholesterol levels checked more often if:  Your lipid or cholesterol levels are high.  You are older than 70 years of age.  You are at high risk for heart disease. What should I know about cancer screening? Depending on your health history and family history, you may need to have cancer screening at various ages. This may include screening for:  Breast cancer.  Cervical cancer.  Colorectal cancer.  Skin cancer.  Lung cancer. What should I know about heart disease, diabetes, and high blood  pressure? Blood pressure and heart disease  High blood pressure causes heart disease and increases the risk of stroke. This is more likely to develop in people who have high blood pressure readings, are of African descent, or are overweight.  Have your blood pressure checked: ? Every 3-5 years if you are 18-39 years of age. ? Every year if you are 40 years old or older. Diabetes Have regular diabetes screenings. This checks your fasting blood sugar level. Have the screening done:  Once every three years after age 40 if you are at a normal weight and have a low risk for diabetes.  More often and at a younger age if you are overweight or have a high risk for diabetes. What should I know about preventing infection? Hepatitis B If you have a higher risk for hepatitis B, you should be screened for this virus. Talk with your health care provider to find out if you are at risk for hepatitis B infection. Hepatitis C Testing is recommended for:  Everyone born from 1945 through 1965.  Anyone with known risk factors for hepatitis C. Sexually transmitted infections (STIs)  Get screened for STIs, including gonorrhea and chlamydia, if: ? You are sexually active and are younger than 70 years of age. ? You are older than 70 years of age and your health care provider tells you that you are at risk for this type of infection. ? Your sexual activity has changed since you were last screened, and you are at increased risk for chlamydia or gonorrhea. Ask your health care provider   if you are at risk.  Ask your health care provider about whether you are at high risk for HIV. Your health care provider may recommend a prescription medicine to help prevent HIV infection. If you choose to take medicine to prevent HIV, you should first get tested for HIV. You should then be tested every 3 months for as long as you are taking the medicine. Pregnancy  If you are about to stop having your period (premenopausal) and  you may become pregnant, seek counseling before you get pregnant.  Take 400 to 800 micrograms (mcg) of folic acid every day if you become pregnant.  Ask for birth control (contraception) if you want to prevent pregnancy. Osteoporosis and menopause Osteoporosis is a disease in which the bones lose minerals and strength with aging. This can result in bone fractures. If you are 65 years old or older, or if you are at risk for osteoporosis and fractures, ask your health care provider if you should:  Be screened for bone loss.  Take a calcium or vitamin D supplement to lower your risk of fractures.  Be given hormone replacement therapy (HRT) to treat symptoms of menopause. Follow these instructions at home: Lifestyle  Do not use any products that contain nicotine or tobacco, such as cigarettes, e-cigarettes, and chewing tobacco. If you need help quitting, ask your health care provider.  Do not use street drugs.  Do not share needles.  Ask your health care provider for help if you need support or information about quitting drugs. Alcohol use  Do not drink alcohol if: ? Your health care provider tells you not to drink. ? You are pregnant, may be pregnant, or are planning to become pregnant.  If you drink alcohol: ? Limit how much you use to 0-1 drink a day. ? Limit intake if you are breastfeeding.  Be aware of how much alcohol is in your drink. In the [place] one drink equals one 12 oz bottle of beer (355 mL), one 5 oz glass of wine (148 mL), or one 1 oz glass of hard liquor (44 mL). General instructions  Schedule regular health, dental, and eye exams.  Stay current with your vaccines.  Tell your health care provider if: ? You often feel depressed. ? You have ever been abused or do not feel safe at home. Summary  Adopting a healthy lifestyle and getting preventive care are important in promoting health and wellness.  Follow your health care provider's instructions about healthy  diet, exercising, and getting tested or screened for diseases.  Follow your health care provider's instructions on monitoring your cholesterol and blood pressure. This information is not intended to replace advice given to you by your health care provider. Make sure you discuss any questions you have with your health care provider. Document Revised: 11/30/2018 Document Reviewed: 11/30/2018 [employer]vier Patient Education  2021 [employer]  

## 2021-05-07 NOTE — Progress Notes (Signed)
Subjective:  Patient ID: Susan Mccarty, female    DOB: [birth year]  Age: 70 y.o. MRN: [id number]  CC: Annual Exam, Hypertension, and Hyperlipidemia  This visit occurred during the SARS-CoV-2 public health emergency.  Safety protocols were in place, including screening questions prior to the visit, additional usage of staff PPE, and extensive cleaning of exam room while observing appropriate contact time as indicated for disinfecting solutions.   HPI Susan Mccarty presents for a CPX and f/up -   She has a stable degree of dyspnea on exertion.  She can walk and climb steps without experiencing chest pain, diaphoresis, dizziness, lightheadedness, or near syncope.  She wants to know her blood type.  Outpatient Medications Prior to Visit  Medication Sig Dispense Refill  . Brinzolamide-Brimonidine 1-0.2 % SUSP Apply to eye.    . chlorthalidone (HYGROTON) 25 MG tablet TAKE 1 TABLET(25 MG) BY MOUTH DAILY 90 tablet 0  . Cholecalciferol (VITAMIN D3) 50 MCG (2000 UT) capsule Take 4 capsules (8,000 Units total) by mouth daily. 180 capsule 1  . Crisaborole (EUCRISA) 2 % OINT Apply 1 Act topically 2 (two) times daily. 60 g 3  . cyanocobalamin 2000 MCG tablet Take 1 tablet (2,000 mcg total) by mouth daily. 90 tablet 3  . dorzolamide (TRUSOPT) 2 % ophthalmic solution 1 drop 2 (two) times daily.    . Iron-Vitamins (GERITOL) LIQD Take by mouth daily.    . potassium chloride SA (K-DUR,KLOR-CON) 20 MEQ tablet Take 1 tablet (20 mEq total) by mouth 2 (two) times daily. 180 tablet 1  . timolol (TIMOPTIC) 0.25 % ophthalmic solution 1 drop daily.     . vitamin A 8000 UNIT capsule Take 8,000 Units by mouth daily.    . vitamin C (ASCORBIC ACID) 500 MG tablet Take 500 mg by mouth daily.    . ferrous sulfate 220 (44 Fe) MG/5ML solution Take 5 mLs (220 mg total) by mouth 2 (two) times daily with a meal. 473 mL 5  . vitamin E 400 UNIT capsule Take 400 Units by mouth daily.     No facility-administered medications  prior to visit.    ROS Review of Systems  Constitutional: Negative.  Negative for chills, diaphoresis, fatigue and fever.  HENT: Negative.   Eyes: Negative.   Respiratory: Positive for shortness of breath (DOE). Negative for cough, chest tightness and wheezing.   Cardiovascular: Negative for chest pain, palpitations and leg swelling.  Gastrointestinal: Negative for abdominal pain and diarrhea.  Endocrine: Negative.  Negative for cold intolerance and heat intolerance.  Genitourinary: Negative for decreased urine volume, difficulty urinating, dysuria, frequency, hematuria, pelvic pain, vaginal bleeding, vaginal discharge and vaginal pain.       Vag itching  Musculoskeletal: Negative.   Skin: Negative.   Neurological: Negative for dizziness, syncope, weakness and light-headedness.  Hematological: Negative for adenopathy. Does not bruise/bleed easily.  Psychiatric/Behavioral: Negative.     Objective:  BP 128/78 (BP Location: Left Arm, Patient Position: Sitting, Cuff Size: Normal)   Pulse (!) 51   Temp 98.6 F (37 C) (Oral)   Resp 16   Ht 5\' 8"  (1.727 m)   Wt 137 lb (62.1 kg)   SpO2 98%   BMI 20.83 kg/m   BP Readings from Last 3 Encounters:  05/07/21 128/78  08/06/20 138/74  01/16/20 (!) 136/58    Wt Readings from Last 3 Encounters:  05/07/21 137 lb (62.1 kg)  08/06/20 139 lb (63 kg)  01/16/20 147 lb (66.7 kg)  Physical Exam Vitals reviewed.  HENT:     Nose: Nose normal.     Mouth/Throat:     Mouth: Mucous membranes are moist.  Eyes:     General: No scleral icterus.    Conjunctiva/sclera: Conjunctivae normal.  Cardiovascular:     Rate and Rhythm: Regular rhythm. Bradycardia present.     Heart sounds: Normal heart sounds, S1 normal and S2 normal. No murmur heard.     Comments: EKG- Marked sinus bradycardia, 42 bpm Otherwise normal EKG Pulmonary:     Effort: Pulmonary effort is normal.     Breath sounds: Normal breath sounds. No wheezing, rhonchi or rales.   Abdominal:     General: Abdomen is flat.     Palpations: There is no mass.     Tenderness: There is no abdominal tenderness. There is no guarding.  Musculoskeletal:     Cervical back: Neck supple.     Right lower leg: No edema.     Left lower leg: No edema.  Lymphadenopathy:     Cervical: No cervical adenopathy.  Skin:    General: Skin is warm and dry.  Neurological:     General: No focal deficit present.     Mental Status: She is alert.  Psychiatric:        Mood and Affect: Mood normal.        Behavior: Behavior normal.     Lab Results  Component Value Date   WBC 2.7 (L) 05/07/2021   HGB 11.8 (L) 05/07/2021   HCT 35.5 (L) 05/07/2021   PLT 185.0 05/07/2021   GLUCOSE 91 08/06/2020   CHOL 183 05/07/2021   TRIG 50.0 05/07/2021   HDL 64.70 05/07/2021   LDLCALC 108 (H) 05/07/2021   ALT 9 05/07/2021   AST 21 05/07/2021   NA 138 08/06/2020   K 3.9 08/06/2020   CL 102 08/06/2020   CREATININE 0.81 08/06/2020   BUN 18 08/06/2020   CO2 32 08/06/2020   TSH 1.33 05/07/2021    No results found.  Assessment & Plan:   Susan Mccarty was seen today for annual exam, hypertension and hyperlipidemia.  Diagnoses and all orders for this visit:  Bradycardia, sinus- Her heart rate is down to 42.  Labs to screen for secondary causes are unremarkable.  I have asked her to follow-up with cardiology. -     Cancel: TSH; Future -     EKG 12-Lead -     Thyroid Panel With TSH; Future -     ABO; Future -     Ambulatory referral to Cardiology -     ABO -     Thyroid Panel With TSH   Essential hypertension- Her blood pressure is adequately well controlled. -     Cancel: TSH; Future -     Urinalysis, Routine w reflex microscopic; Future -     Thyroid Panel With TSH; Future -     ABO; Future -     ABO -     Thyroid Panel With TSH -     Urinalysis, Routine w reflex microscopic  Other dietary vitamin B12 deficiency anemia- Her B12 and folate levels are normal now. -     CBC with  Differential/Platelet; Future -     Vitamin B12; Future -     Folate; Future -     ABO; Future -     ABO -     Folate -     Vitamin B12 -     CBC  with Differential/Platelet  Iron deficiency anemia secondary to inadequate dietary iron intake- Her iron level is normal now. -     Iron; Future -     Ferritin; Future -     Ferritin -     Iron  Hyperlipidemia with target LDL less than 130- Her ASCVD risk score is only 10%.  I did not recommend a statin for CV risk reduction. -     Lipid panel; Future -     Hepatic function panel; Future -     ABO; Future -     ABO -     Hepatic function panel -     Lipid panel  Vitamin D deficiency- Her vitamin D level is normal now. -     VITAMIN D 25 Hydroxy (Vit-D Deficiency, Fractures); Future -     ABO; Future -     ABO -     VITAMIN D 25 Hydroxy (Vit-D Deficiency, Fractures)  Routine general medical examination at a health care facility- Exam completed, labs reviewed, she refused a pneumovax, cancer screenings addressed, patient education was given.  Vaginal itching -     Ambulatory referral to Gynecology -     ABO; Future -     ABO  Visit for screening mammogram -     MM DIGITAL SCREENING BILATERAL; Future   I have discontinued Susan Mccarty. Nasworthy's vitamin E and ferrous sulfate. I am also having her maintain her vitamin A, vitamin C, Geritol, potassium chloride SA, cyanocobalamin, Brinzolamide-Brimonidine, Crisaborole, timolol, dorzolamide, Vitamin D3, and chlorthalidone.  No orders of the defined types were placed in this encounter.    Follow-up: Return in about 6 months (around 11/07/2021).  Scarlette Calico, MD

## 2021-05-08 ENCOUNTER — Encounter: Payer: Self-pay | Admitting: Internal Medicine

## 2021-05-08 LAB — THYROID PANEL WITH TSH
Free Thyroxine Index: 2.8 (ref 1.4–3.8)
T3 Uptake: 32 % (ref 22–35)
T4, Total: 8.9 ug/dL (ref 5.1–11.9)
TSH: 1.33 mIU/L (ref 0.40–4.50)

## 2021-05-08 LAB — ABO

## 2021-05-09 LAB — URINALYSIS, ROUTINE W REFLEX MICROSCOPIC
Bilirubin Urine: NEGATIVE
Hgb urine dipstick: NEGATIVE
Ketones, ur: NEGATIVE
Nitrite: NEGATIVE
Specific Gravity, Urine: 1.02 (ref 1.000–1.030)
Total Protein, Urine: NEGATIVE
Urine Glucose: NEGATIVE
Urobilinogen, UA: 0.2 (ref 0.0–1.0)
pH: 6 (ref 5.0–8.0)

## 2021-05-13 ENCOUNTER — Encounter: Payer: Self-pay | Admitting: Cardiology

## 2021-05-13 ENCOUNTER — Ambulatory Visit: Payer: PPO | Admitting: Cardiology

## 2021-05-13 ENCOUNTER — Other Ambulatory Visit: Payer: Self-pay

## 2021-05-13 VITALS — BP 132/78 | HR 50 | Ht 68.0 in | Wt 138.0 lb

## 2021-05-13 DIAGNOSIS — R001 Bradycardia, unspecified: Secondary | ICD-10-CM | POA: Diagnosis not present

## 2021-05-13 DIAGNOSIS — E785 Hyperlipidemia, unspecified: Secondary | ICD-10-CM | POA: Diagnosis not present

## 2021-05-13 DIAGNOSIS — I10 Essential (primary) hypertension: Secondary | ICD-10-CM

## 2021-05-13 NOTE — Progress Notes (Signed)
Cardiology Office Note:    Date:  05/13/2021   ID:  Susan Mccarty, DOB [birth year], MRN [id number]  PCP:  Janith Lima, MD   [facility] Providers Cardiologist:  Ena Dawley, MD (Inactive) {  Referring MD: Janith Lima, MD    History of Present Illness:    Susan Mccarty is a 70 y.o. female with a hx of sinus bradycardia, HTN and HLD who was previously followed by Dr. Meda Coffee who now returns to clinic for follow-up.  Patient had seen Ermalinda Barrios for bradycardia and SOB when walking. The only medication she was on at that time was timolol for glaucoma. TTE 09/07/17 showed normal LVEF 55-60% with grade 1 DD mild TR and mildly elevated pulmonary pressure.Normal GXT 10/2018.  Today, the patient states that she overall feels well. She is very active and walks everyday without issues. No chest pain, lightheadedness, dizziness or syncope. Was seen by PCP on 05/07/21 where her HR was noted to be in the 40s and notably she was on the timolol eye drops at that time. She called her opthalmologist at and she was taken off the medication. She takes no other nodal agents. HR today has improved to the 50s and she remains asymptomatic with no dizziness, lightheadedness, syncope, decreased exercise capacity, SOB, or chest pain.  Past Medical History:  Diagnosis Date  . Bradycardia 08/17/2017  . Bradycardia, sinus 06/02/2016  . Deficiency anemia 08/17/2017  . Essential hypertension 06/02/2016  . Fibroid, uterine 09/17/2016  . Glaucoma   . Hyperlipidemia with target LDL less than 130 05/17/2014   Framingham risk score is 4%  . Hypokalemia 05/11/2017  . Iron deficiency anemia secondary to inadequate dietary iron intake 08/19/2017  . Loss of weight 09/29/2016  . Osteoporosis 01/2018   T score -2.5  . Other dietary vitamin B12 deficiency anemia 08/19/2017  . Postmenopausal postcoital bleeding 09/29/2016  . Routine health maintenance 09/26/2011  . Visit for screening mammogram 05/27/2015  . Vitamin  D deficiency   . Weight loss, non-intentional 09/17/2016    Past Surgical History:  Procedure Laterality Date  . BREAST CYST EXCISION  2006   right     Current Medications: Current Meds  Medication Sig  . Brinzolamide-Brimonidine 1-0.2 % SUSP Apply to eye.  . chlorthalidone (HYGROTON) 25 MG tablet TAKE 1 TABLET(25 MG) BY MOUTH DAILY  . Cholecalciferol (VITAMIN D3) 50 MCG (2000 UT) capsule Take 4 capsules (8,000 Units total) by mouth daily.  Stasia Cavalier (EUCRISA) 2 % OINT Apply 1 Act topically 2 (two) times daily.  . cyanocobalamin 2000 MCG tablet Take 1 tablet (2,000 mcg total) by mouth daily.  . dorzolamide (TRUSOPT) 2 % ophthalmic solution 1 drop 2 (two) times daily.  . Iron-Vitamins (GERITOL) LIQD Take by mouth daily.  . potassium chloride SA (K-DUR,KLOR-CON) 20 MEQ tablet Take 1 tablet (20 mEq total) by mouth 2 (two) times daily.  . vitamin A 8000 UNIT capsule Take 8,000 Units by mouth daily.  . vitamin C (ASCORBIC ACID) 500 MG tablet Take 500 mg by mouth daily.  . [DISCONTINUED] timolol (TIMOPTIC) 0.25 % ophthalmic solution 1 drop daily.      Allergies:   Patient has no known allergies.   Social History   Socioeconomic History  . Marital status: Single    Spouse name: Not on file  . Number of children: 0  . Years of education: 42  . Highest education level: Not on file  Occupational History  . Occupation: Regulatory affairs officer  Employer: [employer]  Tobacco Use  . Smoking status: Never Smoker  . Smokeless tobacco: Never Used  Vaping Use  . Vaping Use: Never used  Substance and Sexual Activity  . Alcohol use: Yes    Alcohol/week: 0.0 standard drinks    Comment: rare use  . Drug use: No  . Sexual activity: Yes    Partners: Male    Comment: 1st intercourse 70 yo-Fewer than 5 partners  Other Topics Concern  . Not on file  Social History Narrative   HSG, no college. Single - in a relationship. No children. 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    Social Determinants of Health   Financial Resource Strain: Not on file  Food Insecurity: Not on file  Transportation Needs: Not on file  Physical Activity: Not on file  Stress: Not on file  Social Connections: Not on file     Family History: The patient's family history includes Alcohol abuse in her brother and mother; Breast cancer in her cousin; Diabetes in her mother and sister; Hypertension in her father and sister. There is no history of Stroke, Cancer, COPD, Early death, Hyperlipidemia, Colon cancer, Esophageal cancer, Rectal cancer, or Stomach cancer.  ROS:   Please see the history of present illness.    Review of Systems  Constitutional: Negative for malaise/fatigue.  HENT: Negative for sore throat.   Eyes: Negative for blurred vision.  Respiratory: Negative for shortness of breath.   Cardiovascular: Negative for chest pain, palpitations, orthopnea, claudication, leg swelling and PND.  Gastrointestinal: Negative for nausea and vomiting.  Genitourinary: Negative for frequency.  Musculoskeletal: Negative for myalgias.  Neurological: Negative for dizziness and loss of consciousness.  Endo/Heme/Allergies: Negative for polydipsia.  Psychiatric/Behavioral: Negative for substance abuse.    EKGs/Labs/Other Studies Reviewed:    The following studies were reviewed today: Cardiac monitor 08/2019:   Sinus bradycardia to sinus tachycardia.  Average heart rate 51 BPM.   Sinus bradycardia to sinus tachycardia. Frequent significant bradycardia in low 40' during awake hours. I would recommend to switch timolol to another medication. She should discuss with her ophthalmologist.   11/15/18 ETT:  Blood pressure demonstrated a hypertensive response to exercise.  There was no ST segment deviation noted during stress.   ETT with fair exercise tolerance (6:30); no chest pain; hypertensive BP response; no ST changes; negative adequate ETT; Duke treadmill score 6.   EKG:  EKG is   ordered today.  The ekg ordered today demonstrates Sinus bradycardia with HR 50  Recent Labs: 08/06/2020: BUN 18; Creat 0.81; Potassium 3.9; Sodium 138 05/07/2021: ALT 9; Hemoglobin 11.8; Platelets 185.0; TSH 1.33  Recent Lipid Panel    Component Value Date/Time   CHOL 183 05/07/2021 0905   CHOL 183 12/05/2019 0948   TRIG 50.0 05/07/2021 0905   TRIG 30 10/25/2006 1012   HDL 64.70 05/07/2021 0905   HDL 69 12/05/2019 0948   CHOLHDL 3 05/07/2021 0905   VLDL 10.0 05/07/2021 0905   LDLCALC 108 (H) 05/07/2021 0905   LDLCALC 106 (H) 12/05/2019 0948     Physical Exam:    VS:  BP 132/78   Pulse (!) 50   Ht 5\' 8"  (1.727 m)   Wt 138 lb (62.6 kg)   SpO2 99%   BMI 20.98 kg/m     Wt Readings from Last 3 Encounters:  05/13/21 138 lb (62.6 kg)  05/07/21 137 lb (62.1 kg)  08/06/20 139 lb (63 kg)     GEN:  Well nourished, well developed in no acute distress HEENT: Normal NECK: No JVD; No carotid bruits CARDIAC: Bradycardic, regular, no murmurs, rubs, gallops RESPIRATORY:  Clear to auscultation without rales, wheezing or rhonchi  ABDOMEN: Soft, non-tender, non-distended MUSCULOSKELETAL:  No edema; No deformity  SKIN: Warm and dry NEUROLOGIC:  Alert and oriented x 3 PSYCHIATRIC:  Normal affect   ASSESSMENT:    1. Bradycardia, sinus   2. Essential hypertension   3. Hyperlipidemia with target LDL less than 130   4. Primary hypertension    PLAN:    In order of problems listed above:  #Sinus Bradycardia: Patient with history of sinus bradycardia thought to be secondary to timolol. She was recently seen in PCPs office where HR 40s in the setting of having resumed timolol for glaucoma. She has since discontinued her medication and HR has improved to 50s today. Fortunately, the patient is asymptomatic with no dizziness, lightheadedness, syncope, SOB or chest pain. She is very active with no exercise limitations. ECG with no blocks. ETT in 2019 normal. TTE 2018 with LVEF 55-60%, G1DD,  no significant valve disease. Suspect she is very sensitive to timolol and should avoid in the future. No indication for PPM placement or further work-up unless symptoms develop or HR<50s. -Discontinue timolol as likely very sensitive to this medication -TSH normal -No indication for further testing as HR improved and no symptoms; if symptoms develop, will plan on monitor and ETT at that time  #HTN: Well controlled and BP mainly 110-130s. -Continue chlorthalidone 25mg  daily  #HLD: Well controlled with LDL 108. -Diet and lifestyle modifications; not on statins  #Glaucoma: Followed by Ophthalmologist. -Will follow-up with ophtho as scheduled to discuss alternatives to timolol     Medication Adjustments/Labs and Tests Ordered: Current medicines are reviewed at length with the patient today.  Concerns regarding medicines are outlined above.  Orders Placed This Encounter  Procedures  . EKG 12-Lead   No orders of the defined types were placed in this encounter.   Patient Instructions  Medication Instructions:   STOP TAKING TIMOLOL EYE DROPS NOW  *If you need a refill on your cardiac medications before your next appointment, please call your pharmacy*   Follow-Up: At [facility], you and your health needs are our priority.  As part of our continuing mission to provide you with exceptional heart care, we have created designated Provider Care Teams.  These Care Teams include your primary Cardiologist (physician) and Advanced Practice Providers (APPs -  Physician Assistants and Nurse Practitioners) who all work together to provide you with the care you need, when you need it.  We recommend signing up for the patient portal called "MyChart".  Sign up information is provided on this After Visit Summary.  MyChart is used to connect with patients for Virtual Visits (Telemedicine).  Patients are able to view lab/test results, encounter notes, upcoming appointments, etc.  Non-urgent  messages can be sent to your provider as well.   To learn more about what you can do with MyChart, go to [email or web address].    Your next appointment:   1 year(s)  The format for your next appointment:   In Person  Provider:   Gwyndolyn Kaufman, MD        Signed, Freada Bergeron, MD  05/13/2021 10:31 AM    [place]

## 2021-05-13 NOTE — Patient Instructions (Signed)
Medication Instructions:   STOP TAKING TIMOLOL EYE DROPS NOW  *If you need a refill on your cardiac medications before your next appointment, please call your pharmacy*   Follow-Up: At [facility], you and your health needs are our priority.  As part of our continuing mission to provide you with exceptional heart care, we have created designated Provider Care Teams.  These Care Teams include your primary Cardiologist (physician) and Advanced Practice Providers (APPs -  Physician Assistants and Nurse Practitioners) who all work together to provide you with the care you need, when you need it.  We recommend signing up for the patient portal called "MyChart".  Sign up information is provided on this After Visit Summary.  MyChart is used to connect with patients for Virtual Visits (Telemedicine).  Patients are able to view lab/test results, encounter notes, upcoming appointments, etc.  Non-urgent messages can be sent to your provider as well.   To learn more about what you can do with MyChart, go to [email or web address].    Your next appointment:   1 year(s)  The format for your next appointment:   In Person  Provider:   Gwyndolyn Kaufman, MD

## 2021-05-15 ENCOUNTER — Ambulatory Visit: Payer: PPO | Admitting: Cardiology

## 2021-05-17 ENCOUNTER — Other Ambulatory Visit: Payer: Self-pay | Admitting: Internal Medicine

## 2021-05-17 DIAGNOSIS — I10 Essential (primary) hypertension: Secondary | ICD-10-CM

## 2021-05-28 ENCOUNTER — Telehealth: Payer: Self-pay | Admitting: Internal Medicine

## 2021-05-28 NOTE — Telephone Encounter (Signed)
Patient is requesting lab orders for her Iron be placed. She was also wondering what she needed to do/take if she is anemic. Please advise

## 2021-05-28 NOTE — Telephone Encounter (Signed)
Pt has been informed per letter sent out on 05/08/21 that PCP wanted to recheck in 2-3 months.   She would like to know if she needs to take a supplement in the meantime. Please advise.

## 2021-05-29 ENCOUNTER — Other Ambulatory Visit: Payer: Self-pay | Admitting: Internal Medicine

## 2021-06-02 ENCOUNTER — Ambulatory Visit: Payer: PPO | Admitting: Obstetrics and Gynecology

## 2021-06-04 ENCOUNTER — Ambulatory Visit (HOSPITAL_COMMUNITY)
Admission: EM | Admit: 2021-06-04 | Discharge: 2021-06-04 | Disposition: A | Discharge status: Home / Self Care | Payer: PPO | Attending: Urgent Care | Admitting: Urgent Care

## 2021-06-04 ENCOUNTER — Other Ambulatory Visit: Payer: Self-pay

## 2021-06-04 ENCOUNTER — Ambulatory Visit (INDEPENDENT_AMBULATORY_CARE_PROVIDER_SITE_OTHER): Payer: PPO

## 2021-06-04 DIAGNOSIS — M79621 Pain in right upper arm: Secondary | ICD-10-CM

## 2021-06-04 MED ORDER — NAPROXEN 375 MG PO TABS: 375.0000 mg | ORAL_TABLET | Freq: Two times a day (BID) | ORAL | 0 refills | Status: DC

## 2021-06-04 NOTE — ED Triage Notes (Signed)
Pt reports rt shoulder pain.

## 2021-06-04 NOTE — ED Provider Notes (Signed)
[place]   MRN: [id number] DOB: 1951-01-27  Subjective:   Susan Mccarty is a 70 y.o. female presenting for several day history of acute onset persistent right upper arm pain between her shoulder and her elbow.  No falls, trauma, redness, swelling, warmth.  No history of clotting disorders.  Patient does exercise but has been doing her normal routine which is involving any particular heavy lifting.  Has used topical pain medication with minimal relief.  She does have a history of osteoporosis.  No current facility-administered medications for this encounter.  Current Outpatient Medications:    Brinzolamide-Brimonidine 1-0.2 % SUSP, Apply to eye., Disp: , Rfl:    chlorthalidone (HYGROTON) 25 MG tablet, TAKE 1 TABLET(25 MG) BY MOUTH DAILY, Disp: 90 tablet, Rfl: 0   Cholecalciferol (VITAMIN D3) 50 MCG (2000 UT) capsule, Take 4 capsules (8,000 Units total) by mouth daily., Disp: 180 capsule, Rfl: 1   Crisaborole (EUCRISA) 2 % OINT, Apply 1 Act topically 2 (two) times daily., Disp: 60 g, Rfl: 3   cyanocobalamin 2000 MCG tablet, Take 1 tablet (2,000 mcg total) by mouth daily., Disp: 90 tablet, Rfl: 3   dorzolamide (TRUSOPT) 2 % ophthalmic solution, 1 drop 2 (two) times daily., Disp: , Rfl:    Iron-Vitamins (GERITOL) LIQD, Take by mouth daily., Disp: , Rfl:    potassium chloride SA (K-DUR,KLOR-CON) 20 MEQ tablet, Take 1 tablet (20 mEq total) by mouth 2 (two) times daily., Disp: 180 tablet, Rfl: 1   vitamin A 8000 UNIT capsule, Take 8,000 Units by mouth daily., Disp: , Rfl:    vitamin C (ASCORBIC ACID) 500 MG tablet, Take 500 mg by mouth daily., Disp: , Rfl:    No Known Allergies  Past Medical History:  Diagnosis Date   Bradycardia 08/17/2017   Bradycardia, sinus 06/02/2016   Deficiency anemia 08/17/2017   Essential hypertension 06/02/2016   Fibroid, uterine 09/17/2016   Glaucoma    Hyperlipidemia with target LDL less than 130 05/17/2014   Framingham risk score is 4%    Hypokalemia 05/11/2017   Iron deficiency anemia secondary to inadequate dietary iron intake 08/19/2017   Loss of weight 09/29/2016   Osteoporosis 01/2018   T score -2.5   Other dietary vitamin B12 deficiency anemia 08/19/2017   Postmenopausal postcoital bleeding 09/29/2016   Routine health maintenance 09/26/2011   Visit for screening mammogram 05/27/2015   Vitamin D deficiency    Weight loss, non-intentional 09/17/2016     Past Surgical History:  Procedure Laterality Date   BREAST CYST EXCISION  2006   right     Family History  Problem Relation Age of Onset   Diabetes Mother    Alcohol abuse Mother    Hypertension Father    Alcohol abuse Brother    Diabetes Sister    Hypertension Sister    Breast cancer Cousin    Stroke Neg Hx    Cancer Neg Hx    COPD Neg Hx    Early death Neg Hx    Hyperlipidemia Neg Hx    Colon cancer Neg Hx    Esophageal cancer Neg Hx    Rectal cancer Neg Hx    Stomach cancer Neg Hx     Social History   Tobacco Use   Smoking status: Never   Smokeless tobacco: Never  Vaping Use   Vaping Use: Never used  Substance Use Topics   Alcohol use: Yes    Alcohol/week: 0.0 standard drinks    Comment:  rare use   Drug use: No    ROS   Objective:   Vitals: BP (!) 156/55   Pulse (!) 56   Temp 98.8 F (37.1 C)   Resp 18   SpO2 100%   Physical Exam Constitutional:      General: She is not in acute distress.    Appearance: Normal appearance. She is well-developed. She is not ill-appearing, toxic-appearing or diaphoretic.  HENT:     Head: Normocephalic and atraumatic.     Nose: Nose normal.     Mouth/Throat:     Mouth: Mucous membranes are moist.     Pharynx: Oropharynx is clear.  Eyes:     General: No scleral icterus.       Right eye: No discharge.        Left eye: No discharge.     Extraocular Movements: Extraocular movements intact.     Conjunctiva/sclera: Conjunctivae normal.     Pupils: Pupils are equal, round, and reactive to light.   Cardiovascular:     Rate and Rhythm: Normal rate.  Pulmonary:     Effort: Pulmonary effort is normal.  Musculoskeletal:     Right shoulder: No swelling, deformity, effusion, laceration, tenderness, bony tenderness or crepitus. Normal range of motion. Normal strength.     Right upper arm: Tenderness present. No swelling, edema, deformity, lacerations or bony tenderness.     Right elbow: No swelling, deformity, effusion or lacerations. Normal range of motion. No tenderness.     Right forearm: No swelling, edema, deformity, lacerations, tenderness or bony tenderness.     Cervical back: No swelling, edema, deformity, erythema, signs of trauma, lacerations, rigidity, spasms, torticollis, tenderness, bony tenderness or crepitus. No pain with movement. Normal range of motion.     Comments: Tenderness about the right upper arm between the inferior most portion of the right shoulder and distal portion of the right upper arm just superior to the elbow.  Skin:    General: Skin is warm and dry.  Neurological:     General: No focal deficit present.     Mental Status: She is alert and oriented to person, place, and time.  Psychiatric:        Mood and Affect: Mood normal.        Behavior: Behavior normal.        Thought Content: Thought content normal.        Judgment: Judgment normal.    DG Humerus Right  Result Date: 06/04/2021 CLINICAL DATA:  Upper arm pain EXAM: RIGHT HUMERUS - 2+ VIEW COMPARISON:  None. FINDINGS: There is no evidence of fracture or other focal bone lesions. Soft tissues are unremarkable. IMPRESSION: Negative. Electronically Signed   By: Donavan Foil M.D.   On: 06/04/2021 19:43     Assessment and Plan :   PDMP not reviewed this encounter.  1. Pain in right upper arm     Recommend naproxen for musculoskeletal pain. X-ray reassuring. Follow up with ortho if symptoms persist. Counseled patient on potential for adverse effects with medications prescribed/recommended today, ER  and return-to-clinic precautions discussed, patient verbalized understanding.    Jaynee Eagles, PA-C 06/04/21 1950

## 2021-06-09 NOTE — Telephone Encounter (Signed)
Called pt, LVM.   

## 2021-06-18 ENCOUNTER — Ambulatory Visit: Payer: PPO | Admitting: Obstetrics & Gynecology

## 2021-06-20 ENCOUNTER — Ambulatory Visit: Payer: PPO | Admitting: Obstetrics & Gynecology

## 2021-06-20 ENCOUNTER — Encounter: Payer: Self-pay | Admitting: Obstetrics & Gynecology

## 2021-06-20 ENCOUNTER — Other Ambulatory Visit: Payer: Self-pay

## 2021-06-20 VITALS — BP 110/72 | HR 54 | Resp 14 | Ht 66.5 in | Wt 136.0 lb

## 2021-06-20 DIAGNOSIS — N898 Other specified noninflammatory disorders of vagina: Secondary | ICD-10-CM | POA: Diagnosis not present

## 2021-06-20 DIAGNOSIS — M810 Age-related osteoporosis without current pathological fracture: Secondary | ICD-10-CM

## 2021-06-20 LAB — WET PREP FOR TRICH, YEAST, CLUE

## 2021-06-20 MED ORDER — FLUCONAZOLE 150 MG PO TABS: 150.0000 mg | ORAL_TABLET | Freq: Every day | ORAL | 1 refills | Status: AC

## 2021-06-20 NOTE — Progress Notes (Signed)
    Susan Mccarty 20-Nov-1951 [id number]        70 y.o.  G1P0010   RP: Vaginal itching  HPI: Recurrent vaginal itching with a mild discharge.  Used Monistat with no itching post treatment.  No pelvic pain.  Postmenopause, well on no HRT.  No PMB.  Osteoporosis on BD 01/2018.  Taking Vit D supplements/Ca++ 1.5 g/d total and doing weight bearing physical activities.   OB History  Gravida Para Term Preterm AB Living  1 0     1 0  SAB IAB Ectopic Multiple Live Births               # Outcome Date GA Lbr Len/2nd Weight Sex Delivery Anes PTL Lv  1 AB             Past medical history,surgical history, problem list, medications, allergies, family history and social history were all reviewed and documented in the EPIC chart.   Directed ROS with pertinent positives and negatives documented in the history of present illness/assessment and plan.  Exam:  Vitals:   06/20/21 0826  BP: 110/72  Pulse: (!) 54  Resp: 14  Weight: 136 lb (61.7 kg)  Height: 5' 6.5" (1.689 m)   General appearance:  Normal  Abdomen: Normal  Gynecologic exam: Vulva normal.  Speculum:  Cervix/Vagina normal.  Mild yellowish discharge.  Wet prep done.  Wet prep: Negative  Bone density 01/2018 Osteoporosis T-Score -2.5 at Lt Femoral Neck   Assessment/Plan:  70 y.o. G1P0010   1. Vaginal itching Recurrent vaginal itching.  Resolved post Monistat treatment.  Wet prep negative today.  We will send a treatment of fluconazole in case of recurrence.  Usage reviewed and prescription sent to pharmacy.  Patient suggested to use boric acid 1 tablet vaginally weekly for prevention as needed.  We will follow-up for annual gynecologic exam. - WET PREP FOR [place], YEAST, CLUE  2. Age-related osteoporosis without current pathological fracture Bone density February 2019 showed osteoporosis with a T score of -2.5 at the left femoral neck.  No bone medication started at that time.  Patient is taking vitamin D supplements, recommend  calcium intake of 1.5 g/day total and regular weightbearing physical activities.  We will repeat a bone density here now. - DG Bone Density; Future  Other orders - VITAMIN D PO; Take 5,000 Int'l Units by mouth daily. - fluconazole (DIFLUCAN) 150 MG tablet; Take 1 tablet (150 mg total) by mouth daily for 3 days.   [name] MD, 8:54 AM 06/20/2021

## 2021-06-25 DIAGNOSIS — H25813 Combined forms of age-related cataract, bilateral: Secondary | ICD-10-CM | POA: Diagnosis not present

## 2021-06-25 DIAGNOSIS — H401123 Primary open-angle glaucoma, left eye, severe stage: Secondary | ICD-10-CM | POA: Diagnosis not present

## 2021-06-25 DIAGNOSIS — H401112 Primary open-angle glaucoma, right eye, moderate stage: Secondary | ICD-10-CM | POA: Diagnosis not present

## 2021-07-15 DIAGNOSIS — M25511 Pain in right shoulder: Secondary | ICD-10-CM | POA: Diagnosis not present

## 2021-08-06 DIAGNOSIS — M7541 Impingement syndrome of right shoulder: Secondary | ICD-10-CM | POA: Diagnosis not present

## 2021-08-06 DIAGNOSIS — S46011D Strain of muscle(s) and tendon(s) of the rotator cuff of right shoulder, subsequent encounter: Secondary | ICD-10-CM | POA: Diagnosis not present

## 2021-08-06 DIAGNOSIS — M25511 Pain in right shoulder: Secondary | ICD-10-CM | POA: Diagnosis not present

## 2021-08-09 DIAGNOSIS — Z1231 Encounter for screening mammogram for malignant neoplasm of breast: Secondary | ICD-10-CM | POA: Diagnosis not present

## 2021-08-09 LAB — HM MAMMOGRAPHY

## 2021-08-12 ENCOUNTER — Telehealth: Payer: Self-pay | Admitting: Internal Medicine
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
## 2021-08-13 NOTE — Telephone Encounter (Signed)
Called again to check on the status of message. Pt wanted to change the pharmacy. I assured the pt that the request had been sent in.   Preferred pharmacy:  [facility] [place], [place] Phone:  [phone]  Fax:  [phone]

## 2021-08-14 DIAGNOSIS — S46011D Strain of muscle(s) and tendon(s) of the rotator cuff of right shoulder, subsequent encounter: Secondary | ICD-10-CM | POA: Diagnosis not present

## 2021-08-14 DIAGNOSIS — M7541 Impingement syndrome of right shoulder: Secondary | ICD-10-CM | POA: Diagnosis not present

## 2021-08-14 DIAGNOSIS — M25511 Pain in right shoulder: Secondary | ICD-10-CM | POA: Diagnosis not present

## 2021-08-14 NOTE — Telephone Encounter (Signed)
Unable to reach patient. Patient needs to be seen.

## 2021-08-18 ENCOUNTER — Ambulatory Visit: Payer: PPO | Admitting: Internal Medicine

## 2021-08-21 DIAGNOSIS — S46011D Strain of muscle(s) and tendon(s) of the rotator cuff of right shoulder, subsequent encounter: Secondary | ICD-10-CM | POA: Diagnosis not present

## 2021-08-21 DIAGNOSIS — M25511 Pain in right shoulder: Secondary | ICD-10-CM | POA: Diagnosis not present

## 2021-08-21 DIAGNOSIS — M7541 Impingement syndrome of right shoulder: Secondary | ICD-10-CM | POA: Diagnosis not present

## 2021-10-17 ENCOUNTER — Other Ambulatory Visit: Payer: Self-pay | Admitting: Internal Medicine

## 2021-10-17 DIAGNOSIS — I10 Essential (primary) hypertension: Secondary | ICD-10-CM

## 2021-10-29 ENCOUNTER — Other Ambulatory Visit: Payer: Self-pay

## 2021-10-29 ENCOUNTER — Encounter: Payer: Self-pay | Admitting: Obstetrics & Gynecology

## 2021-10-29 ENCOUNTER — Ambulatory Visit (INDEPENDENT_AMBULATORY_CARE_PROVIDER_SITE_OTHER): Payer: PPO | Admitting: Obstetrics & Gynecology

## 2021-10-29 VITALS — BP 102/64 | HR 60 | Resp 14 | Ht 66.5 in | Wt 130.0 lb

## 2021-10-29 DIAGNOSIS — Z780 Asymptomatic menopausal state: Secondary | ICD-10-CM | POA: Diagnosis not present

## 2021-10-29 DIAGNOSIS — M810 Age-related osteoporosis without current pathological fracture: Secondary | ICD-10-CM | POA: Diagnosis not present

## 2021-10-29 DIAGNOSIS — Z01419 Encounter for gynecological examination (general) (routine) without abnormal findings: Secondary | ICD-10-CM | POA: Diagnosis not present

## 2021-10-29 DIAGNOSIS — Z9189 Other specified personal risk factors, not elsewhere classified: Secondary | ICD-10-CM

## 2021-10-29 NOTE — Progress Notes (Signed)
Susan Mccarty 04/29/1951 [id number]   History:    70 y.o. G1P0A1  RP:  Established patient presenting for annual gyn exam   HPI:  Postmenopause, well on no HRT.  No PMB.  No pelvic pain. Recurrent vaginal itching with a mild discharge.  Seen for that issue 06/2021, treated with Fluconazole.  Pap Neg 2017. Breasts normal.  Mammo 2022, pending report.  Osteoporosis on BD 01/2018, will schedule a repeat BD here now.  Taking Vit D supplements/Ca++ 1.5 g/d total and doing weight bearing physical activities.  BMI 20.67.  Health labs with Fam MD.  Harriet Masson 2016.    Past medical history,surgical history, family history and social history were all reviewed and documented in the EPIC chart.  Gynecologic History No LMP recorded. Patient is postmenopausal.  Obstetric History OB History  Gravida Para Term Preterm AB Living  1 0     1 0  SAB IAB Ectopic Multiple Live Births               # Outcome Date GA Lbr Len/2nd Weight Sex Delivery Anes PTL Lv  1 AB              ROS: A ROS was performed and pertinent positives and negatives are included in the history.  GENERAL: No fevers or chills. HEENT: No change in vision, no earache, sore throat or sinus congestion. NECK: No pain or stiffness. CARDIOVASCULAR: No chest pain or pressure. No palpitations. PULMONARY: No shortness of breath, cough or wheeze. GASTROINTESTINAL: No abdominal pain, nausea, vomiting or diarrhea, melena or bright red blood per rectum. GENITOURINARY: No urinary frequency, urgency, hesitancy or dysuria. MUSCULOSKELETAL: No joint or muscle pain, no back pain, no recent trauma. DERMATOLOGIC: No rash, no itching, no lesions. ENDOCRINE: No polyuria, polydipsia, no heat or cold intolerance. No recent change in weight. HEMATOLOGICAL: No anemia or easy bruising or bleeding. NEUROLOGIC: No headache, seizures, numbness, tingling or weakness. PSYCHIATRIC: No depression, no loss of interest in normal activity or change in sleep pattern.      Exam:   BP 102/64   Pulse 60   Resp 14   Ht 5' 6.5" (1.689 m)   Wt 130 lb (59 kg)   BMI 20.67 kg/m   Body mass index is 20.67 kg/m.  General appearance : Well developed well nourished female. No acute distress HEENT: Eyes: no retinal hemorrhage or exudates,  Neck supple, trachea midline, no carotid bruits, no thyroidmegaly Lungs: Clear to auscultation, no rhonchi or wheezes, or rib retractions  Heart: Regular rate and rhythm, no murmurs or gallops Breast:Examined in sitting and supine position were symmetrical in appearance, no palpable masses or tenderness,  no skin retraction, no nipple inversion, no nipple discharge, no skin discoloration, no axillary or supraclavicular lymphadenopathy Abdomen: no palpable masses or tenderness, no rebound or guarding Extremities: no edema or skin discoloration or tenderness  Pelvic: Vulva: Normal             Vagina: No gross lesions or discharge  Cervix: No gross lesions or discharge  Uterus  AV, normal size, mildly nodular, non-tender and mobile  Adnexa  Without masses or tenderness  Anus: Normal   Assessment/Plan:  70 y.o. female for annual exam   1. Wellness Gynecologic Exam at risk of fracture due to osteoporosis Postmenopause, well on no HRT.  No PMB.  No pelvic pain. Recurrent vaginal itching with a mild discharge.  Seen for that issue 06/2021, treated with Fluconazole.  Pap Neg 2017. Breasts  normal.  Mammo 2022, pending report.  Osteoporosis on BD 01/2018, will schedule a repeat BD here now.  Taking Vit D supplements/Ca++ 1.5 g/d total and doing weight bearing physical activities.  BMI 20.67.  Health labs with Fam MD.  Harriet Masson 2016.  2. Postmenopause Well on no HRT.  No PMB. - DG Bone Density; Future  3. Age-related osteoporosis without current pathological fracture Osteoporosis on bone density in 2019.  Patient is not on bone medication currently.  We will schedule a repeat bone density here now.  Continue on vitamin D supplements,  calcium intake of 1.5 g/day total and regular weightbearing physical activities.   - DG Bone Density; Future  Other orders - brimonidine (ALPHAGAN) 0.2 % ophthalmic solution; 1 drop 2 (two) times daily.   [name] MD, 9:19 AM 10/29/2021

## 2021-11-05 DIAGNOSIS — H25813 Combined forms of age-related cataract, bilateral: Secondary | ICD-10-CM | POA: Diagnosis not present

## 2021-11-05 DIAGNOSIS — H401123 Primary open-angle glaucoma, left eye, severe stage: Secondary | ICD-10-CM | POA: Diagnosis not present

## 2021-11-05 DIAGNOSIS — H401112 Primary open-angle glaucoma, right eye, moderate stage: Secondary | ICD-10-CM | POA: Diagnosis not present

## 2021-12-24 ENCOUNTER — Encounter: Payer: Self-pay | Admitting: Obstetrics & Gynecology

## 2022-01-06 DIAGNOSIS — Z0289 Encounter for other administrative examinations: Secondary | ICD-10-CM

## 2022-01-12 DIAGNOSIS — H401123 Primary open-angle glaucoma, left eye, severe stage: Secondary | ICD-10-CM | POA: Diagnosis not present

## 2022-01-12 DIAGNOSIS — H25813 Combined forms of age-related cataract, bilateral: Secondary | ICD-10-CM | POA: Diagnosis not present

## 2022-01-12 DIAGNOSIS — H401112 Primary open-angle glaucoma, right eye, moderate stage: Secondary | ICD-10-CM | POA: Diagnosis not present

## 2022-01-16 ENCOUNTER — Other Ambulatory Visit: Payer: Self-pay | Admitting: Internal Medicine

## 2022-01-16 ENCOUNTER — Telehealth: Payer: Self-pay

## 2022-01-16 DIAGNOSIS — I10 Essential (primary) hypertension: Secondary | ICD-10-CM

## 2022-01-16 MED ORDER — CHLORTHALIDONE 25 MG PO TABS: ORAL_TABLET | ORAL | 0 refills | Status: DC

## 2022-01-16 NOTE — Telephone Encounter (Signed)
Pt calling in requesting a medication refill for: chlorthalidone (HYGROTON) 25 MG tablet.  Pt also scheduled an appt for f/u 2/20.  Pharmacy: [facility] [place], [place] AT [facility]  LOV: 05/07/21

## 2022-02-09 ENCOUNTER — Ambulatory Visit: Payer: PPO | Admitting: Internal Medicine

## 2022-03-03 ENCOUNTER — Encounter: Payer: Self-pay | Admitting: Internal Medicine

## 2022-03-03 ENCOUNTER — Ambulatory Visit (INDEPENDENT_AMBULATORY_CARE_PROVIDER_SITE_OTHER): Payer: Medicare HMO | Admitting: Internal Medicine

## 2022-03-03 ENCOUNTER — Other Ambulatory Visit: Payer: Self-pay

## 2022-03-03 VITALS — BP 152/76 | HR 48 | Temp 98.2°F | Resp 16 | Ht 66.5 in | Wt 135.0 lb

## 2022-03-03 DIAGNOSIS — D508 Other iron deficiency anemias: Secondary | ICD-10-CM

## 2022-03-03 DIAGNOSIS — Z0001 Encounter for general adult medical examination with abnormal findings: Secondary | ICD-10-CM | POA: Insufficient documentation

## 2022-03-03 DIAGNOSIS — R001 Bradycardia, unspecified: Secondary | ICD-10-CM

## 2022-03-03 DIAGNOSIS — D513 Other dietary vitamin B12 deficiency anemia: Secondary | ICD-10-CM

## 2022-03-03 DIAGNOSIS — I10 Essential (primary) hypertension: Secondary | ICD-10-CM | POA: Diagnosis not present

## 2022-03-03 LAB — CBC WITH DIFFERENTIAL/PLATELET
Basophils Absolute: 0 10*3/uL (ref 0.0–0.1)
Basophils Relative: 0.5 % (ref 0.0–3.0)
Eosinophils Absolute: 0.1 10*3/uL (ref 0.0–0.7)
Eosinophils Relative: 3.1 % (ref 0.0–5.0)
HCT: 35 % — ABNORMAL LOW (ref 36.0–46.0)
Hemoglobin: 11.6 g/dL — ABNORMAL LOW (ref 12.0–15.0)
Lymphocytes Relative: 30.1 % (ref 12.0–46.0)
Lymphs Abs: 1.1 10*3/uL (ref 0.7–4.0)
MCHC: 33.2 g/dL (ref 30.0–36.0)
MCV: 82.8 fl (ref 78.0–100.0)
Monocytes Absolute: 0.5 10*3/uL (ref 0.1–1.0)
Monocytes Relative: 14 % — ABNORMAL HIGH (ref 3.0–12.0)
Neutro Abs: 1.9 10*3/uL (ref 1.4–7.7)
Neutrophils Relative %: 52.3 % (ref 43.0–77.0)
Platelets: 201 10*3/uL (ref 150.0–400.0)
RBC: 4.23 Mil/uL (ref 3.87–5.11)
RDW: 13.1 % (ref 11.5–15.5)
WBC: 3.6 10*3/uL — ABNORMAL LOW (ref 4.0–10.5)

## 2022-03-03 LAB — IBC + FERRITIN
Ferritin: 216.6 ng/mL (ref 10.0–291.0)
Iron: 96 ug/dL (ref 42–145)
Saturation Ratios: 29.6 % (ref 20.0–50.0)
TIBC: 324.8 ug/dL (ref 250.0–450.0)
Transferrin: 232 mg/dL (ref 212.0–360.0)

## 2022-03-03 LAB — HEPATIC FUNCTION PANEL
ALT: 8 U/L (ref 0–35)
AST: 22 U/L (ref 0–37)
Albumin: 4.1 g/dL (ref 3.5–5.2)
Alkaline Phosphatase: 52 U/L (ref 39–117)
Bilirubin, Direct: 0.2 mg/dL (ref 0.0–0.3)
Total Bilirubin: 0.8 mg/dL (ref 0.2–1.2)
Total Protein: 7.1 g/dL (ref 6.0–8.3)

## 2022-03-03 LAB — BASIC METABOLIC PANEL
BUN: 18 mg/dL (ref 6–23)
CO2: 30 mEq/L (ref 19–32)
Calcium: 9.6 mg/dL (ref 8.4–10.5)
Chloride: 101 mEq/L (ref 96–112)
Creatinine, Ser: 0.79 mg/dL (ref 0.40–1.20)
GFR: 75.74 mL/min (ref >60.00)
Glucose, Bld: 88 mg/dL (ref 70–99)
Potassium: 3.5 mEq/L (ref 3.5–5.1)
Sodium: 138 mEq/L (ref 135–145)

## 2022-03-03 LAB — FOLATE: Folate: 12.3 ng/mL (ref >5.9)

## 2022-03-03 LAB — VITAMIN B12: Vitamin B-12: 335 pg/mL (ref 211–911)

## 2022-03-03 NOTE — Patient Instructions (Signed)

## 2022-03-03 NOTE — Progress Notes (Signed)
? ?Subjective:  ?Patient ID: Susan Mccarty, female    DOB: 16-Jan-1951  Age: 71 y.o. MRN: [id number] ? ?CC: Hypertension and Anemia ? ?This visit occurred during the SARS-CoV-2 public health emergency.  Safety protocols were in place, including screening questions prior to the visit, additional usage of staff PPE, and extensive cleaning of exam room while observing appropriate contact time as indicated for disinfecting solutions.   ? ?HPI ?Susan Mccarty presents for a CPX and f/up -  ? ?She continues to complain of right shoulder pain.  She has been seeing Ortho about this.  She is active and denies dizziness, lightheadedness, palpitations, chest pain, shortness of breath, edema, or presyncope. ? ?Outpatient Medications Prior to Visit  ?Medication Sig Dispense Refill  ? brimonidine (ALPHAGAN) 0.2 % ophthalmic solution 1 drop 2 (two) times daily.    ? Crisaborole (EUCRISA) 2 % OINT Apply 1 Act topically 2 (two) times daily. 60 g 3  ? cyanocobalamin 2000 MCG tablet Take 1 tablet (2,000 mcg total) by mouth daily. 90 tablet 3  ? Iron-Vitamins (GERITOL) LIQD Take by mouth daily.    ? vitamin A 8000 UNIT capsule Take 8,000 Units by mouth daily.    ? vitamin C (ASCORBIC ACID) 500 MG tablet Take 500 mg by mouth daily.    ? VITAMIN D PO Take 5,000 Int'l Units by mouth daily.    ? chlorthalidone (HYGROTON) 25 MG tablet TAKE 1 TABLET(25 MG) BY MOUTH DAILY 90 tablet 0  ? naproxen (NAPROSYN) 375 MG tablet Take 1 tablet (375 mg total) by mouth 2 (two) times daily with a meal. 20 tablet 0  ? ?No facility-administered medications prior to visit.  ? ? ?ROS ?Review of Systems  ?Constitutional:  Negative for chills, diaphoresis, fatigue and unexpected weight change.  ?HENT: Negative.    ?Eyes: Negative.   ?Respiratory:  Negative for cough, chest tightness, shortness of breath and wheezing.   ?Cardiovascular:  Negative for chest pain, palpitations and leg swelling.  ?Gastrointestinal:  Negative for abdominal pain, constipation, diarrhea,  nausea and vomiting.  ?Endocrine: Negative.   ?Genitourinary: Negative.  Negative for difficulty urinating.  ?Musculoskeletal:  Positive for arthralgias. Negative for back pain, myalgias and neck pain.  ?Skin: Negative.  Negative for color change and pallor.  ?Allergic/Immunologic: Negative.   ?Neurological: Negative.  Negative for dizziness, weakness and light-headedness.  ?Hematological:  Negative for adenopathy. Does not bruise/bleed easily.  ?Psychiatric/Behavioral: Negative.    ? ?Objective:  ?BP (!) 152/76 (BP Location: Right Arm, Patient Position: Sitting, Cuff Size: Large)   Pulse (!) 48   Temp 98.2 ?F (36.8 ?C) (Oral)   Resp 16   Ht 5' 6.5" (1.689 m)   Wt 135 lb (61.2 kg)   SpO2 99%   BMI 21.46 kg/m?  ? ?BP Readings from Last 3 Encounters:  ?03/03/22 (!) 152/76  ?10/29/21 102/64  ?06/20/21 110/72  ? ? ?Wt Readings from Last 3 Encounters:  ?03/03/22 135 lb (61.2 kg)  ?10/29/21 130 lb (59 kg)  ?06/20/21 136 lb (61.7 kg)  ? ? ?Physical Exam ?Vitals reviewed.  ?HENT:  ?   Nose: Nose normal.  ?   Mouth/Throat:  ?   Mouth: Mucous membranes are moist.  ?Eyes:  ?   General: No scleral icterus. ?   Conjunctiva/sclera: Conjunctivae normal.  ?Cardiovascular:  ?   Rate and Rhythm: Regular rhythm. Bradycardia present.  ?   Heart sounds: Normal heart sounds, S1 normal and S2 normal. No murmur heard. ?  No  gallop.  ?   Comments: EKG -  ?SB, 48 bpm ?Septal infarct pattern is old ?unchanged ?Pulmonary:  ?   Effort: Pulmonary effort is normal.  ?   Breath sounds: No stridor. No wheezing, rhonchi or rales.  ?Abdominal:  ?   General: Abdomen is flat.  ?   Palpations: There is no mass.  ?   Tenderness: There is no abdominal tenderness. There is no guarding.  ?   Hernia: No hernia is present.  ?Musculoskeletal:  ?   Right lower leg: No edema.  ?   Left lower leg: No edema.  ?Skin: ?   General: Skin is warm and dry.  ?Neurological:  ?   General: No focal deficit present.  ?   Mental Status: She is alert.  ?Psychiatric:      ?   Mood and Affect: Mood normal.     ?   Behavior: Behavior normal.  ? ? ?Lab Results  ?Component Value Date  ? WBC 3.6 (L) 03/03/2022  ? HGB 11.6 (L) 03/03/2022  ? HCT 35.0 (L) 03/03/2022  ? PLT 201.0 03/03/2022  ? GLUCOSE 88 03/03/2022  ? CHOL 183 05/07/2021  ? TRIG 50.0 05/07/2021  ? HDL 64.70 05/07/2021  ? LDLCALC 108 (H) 05/07/2021  ? ALT 8 03/03/2022  ? AST 22 03/03/2022  ? NA 138 03/03/2022  ? K 3.5 03/03/2022  ? CL 101 03/03/2022  ? CREATININE 0.79 03/03/2022  ? BUN 18 03/03/2022  ? CO2 30 03/03/2022  ? TSH 1.33 05/07/2021  ? ? ?DG Humerus Right ? ?Result Date: 06/04/2021 ?CLINICAL DATA:  Upper arm pain EXAM: RIGHT HUMERUS - 2+ VIEW COMPARISON:  None. FINDINGS: There is no evidence of fracture or other focal bone lesions. Soft tissues are unremarkable. IMPRESSION: Negative. Electronically Signed   By: Donavan Foil M.D.   On: 06/04/2021 19:43  ? ? ?Assessment & Plan:  ? ?Kaysen was seen today for hypertension and anemia. ? ?Diagnoses and all orders for this visit: ? ?Other dietary vitamin B12 deficiency anemia- She is mildly anemic.  B12 and folate are normal. ?-     CBC with Differential/Platelet; Future ?-     Vitamin B12; Future ?-     Folate; Future ?-     Folate ?-     Vitamin B12 ?-     CBC with Differential/Platelet ? ?Iron deficiency anemia secondary to inadequate dietary iron intake- She is mildly anemic but her iron level is normal. ?-     CBC with Differential/Platelet; Future ?-     IBC + Ferritin; Future ?-     IBC + Ferritin ?-     CBC with Differential/Platelet ? ?Essential hypertension- Her blood pressure is not adequately well controlled.  I have asked her to be more compliant with the thiazide diuretic. ?-     Basic metabolic panel; Future ?-     Hepatic function panel; Future ?-     Hepatic function panel ?-     Basic metabolic panel ? ?Bradycardia, sinus- She is asymptomatic with this. ?-     EKG 12-Lead ? ? ?I have discontinued Antonique Langford. Samano's naproxen. I am also having her maintain her  vitamin A, vitamin C, Geritol, cyanocobalamin, Crisaborole, VITAMIN D PO, brimonidine, and chlorthalidone. ? ?Meds ordered this encounter  ?Medications  ? chlorthalidone (HYGROTON) 25 MG tablet  ?  Sig: TAKE 1 TABLET(25 MG) BY MOUTH DAILY  ?  Dispense:  90 tablet  ?  Refill:  1  ? ? ? ?  Follow-up: Return in about 3 months (around 06/03/2022). ? ?Scarlette Calico, MD ? ?

## 2022-03-07 MED ORDER — CHLORTHALIDONE 25 MG PO TABS: ORAL_TABLET | ORAL | 1 refills | Status: DC

## 2022-06-10 ENCOUNTER — Encounter: Payer: Self-pay | Admitting: Internal Medicine

## 2022-06-10 ENCOUNTER — Ambulatory Visit (INDEPENDENT_AMBULATORY_CARE_PROVIDER_SITE_OTHER): Payer: Medicare HMO | Admitting: Internal Medicine

## 2022-06-10 VITALS — BP 126/72 | HR 64 | Temp 98.4°F | Ht 66.5 in | Wt 134.0 lb

## 2022-06-10 DIAGNOSIS — D513 Other dietary vitamin B12 deficiency anemia: Secondary | ICD-10-CM

## 2022-06-10 DIAGNOSIS — R001 Bradycardia, unspecified: Secondary | ICD-10-CM

## 2022-06-10 DIAGNOSIS — E785 Hyperlipidemia, unspecified: Secondary | ICD-10-CM

## 2022-06-10 DIAGNOSIS — R0609 Other forms of dyspnea: Secondary | ICD-10-CM | POA: Diagnosis not present

## 2022-06-10 DIAGNOSIS — I10 Essential (primary) hypertension: Secondary | ICD-10-CM

## 2022-06-10 DIAGNOSIS — Z0001 Encounter for general adult medical examination with abnormal findings: Secondary | ICD-10-CM

## 2022-06-10 DIAGNOSIS — D508 Other iron deficiency anemias: Secondary | ICD-10-CM | POA: Diagnosis not present

## 2022-06-10 DIAGNOSIS — R0989 Other specified symptoms and signs involving the circulatory and respiratory systems: Secondary | ICD-10-CM | POA: Diagnosis not present

## 2022-06-10 LAB — CBC WITH DIFFERENTIAL/PLATELET
Basophils Absolute: 0 10*3/uL (ref 0.0–0.1)
Basophils Relative: 0.4 % (ref 0.0–3.0)
Eosinophils Absolute: 0.1 10*3/uL (ref 0.0–0.7)
Eosinophils Relative: 1.3 % (ref 0.0–5.0)
HCT: 36.6 % (ref 36.0–46.0)
Hemoglobin: 12 g/dL (ref 12.0–15.0)
Lymphocytes Relative: 19.5 % (ref 12.0–46.0)
Lymphs Abs: 0.9 10*3/uL (ref 0.7–4.0)
MCHC: 32.7 g/dL (ref 30.0–36.0)
MCV: 83.3 fl (ref 78.0–100.0)
Monocytes Absolute: 0.6 10*3/uL (ref 0.1–1.0)
Monocytes Relative: 12.8 % — ABNORMAL HIGH (ref 3.0–12.0)
Neutro Abs: 3.2 10*3/uL (ref 1.4–7.7)
Neutrophils Relative %: 66 % (ref 43.0–77.0)
Platelets: 189 10*3/uL (ref 150.0–400.0)
RBC: 4.39 Mil/uL (ref 3.87–5.11)
RDW: 13.4 % (ref 11.5–15.5)
WBC: 4.8 10*3/uL (ref 4.0–10.5)

## 2022-06-10 LAB — LIPID PANEL
Cholesterol: 169 mg/dL (ref 0–200)
HDL: 63.2 mg/dL (ref >39.00)
LDL Cholesterol: 97 mg/dL (ref 0–99)
NonHDL: 105.38
Total CHOL/HDL Ratio: 3
Triglycerides: 44 mg/dL (ref 0.0–149.0)
VLDL: 8.8 mg/dL (ref 0.0–40.0)

## 2022-06-10 LAB — BASIC METABOLIC PANEL
BUN: 24 mg/dL — ABNORMAL HIGH (ref 6–23)
CO2: 30 mEq/L (ref 19–32)
Calcium: 9.4 mg/dL (ref 8.4–10.5)
Chloride: 101 mEq/L (ref 96–112)
Creatinine, Ser: 0.81 mg/dL (ref 0.40–1.20)
GFR: 73.37 mL/min (ref >60.00)
Glucose, Bld: 94 mg/dL (ref 70–99)
Potassium: 3.5 mEq/L (ref 3.5–5.1)
Sodium: 137 mEq/L (ref 135–145)

## 2022-06-10 LAB — TROPONIN I (HIGH SENSITIVITY): High Sens Troponin I: 4 ng/L (ref 2–17)

## 2022-06-10 LAB — BRAIN NATRIURETIC PEPTIDE: Pro B Natriuretic peptide (BNP): 77 pg/mL (ref 0.0–100.0)

## 2022-06-10 LAB — TSH: TSH: 0.62 u[IU]/mL (ref 0.35–5.50)

## 2022-06-10 NOTE — Patient Instructions (Signed)

## 2022-06-10 NOTE — Progress Notes (Signed)
Subjective:  Patient ID: Susan Mccarty, female    DOB: 06-07-1951  Age: 71 y.o. MRN: [id number]  CC: Anemia, Annual Exam, Hypertension, and Hyperlipidemia   HPI [name] presents for a CPX and f/up -  She complains of a one month hx of DOE and fatigue.  She denies chest pain, diaphoresis, palpitations, dizziness, lightheadedness, near-syncope, or edema.  Outpatient Medications Prior to Visit  Medication Sig Dispense Refill   brimonidine (ALPHAGAN) 0.2 % ophthalmic solution 1 drop 2 (two) times daily.     chlorthalidone (HYGROTON) 25 MG tablet TAKE 1 TABLET(25 MG) BY MOUTH DAILY 90 tablet 1   cyanocobalamin 2000 MCG tablet Take 1 tablet (2,000 mcg total) by mouth daily. 90 tablet 3   Iron-Vitamins (GERITOL) LIQD Take by mouth daily.     vitamin C (ASCORBIC ACID) 500 MG tablet Take 500 mg by mouth daily.     VITAMIN D PO Take 5,000 Int'l Units by mouth daily.     Crisaborole (EUCRISA) 2 % OINT Apply 1 Act topically 2 (two) times daily. 60 g 3   No facility-administered medications prior to visit.    ROS Review of Systems  Constitutional:  Positive for fatigue. Negative for appetite change, diaphoresis and unexpected weight change.  HENT: Negative.    Eyes: Negative.   Respiratory:  Positive for shortness of breath. Negative for cough, chest tightness and wheezing.   Cardiovascular:  Negative for chest pain, palpitations and leg swelling.  Gastrointestinal:  Negative for abdominal pain, diarrhea, nausea and vomiting.  Endocrine: Negative.   Genitourinary: Negative.  Negative for difficulty urinating.  Musculoskeletal: Negative.  Negative for myalgias.  Skin: Negative.  Negative for color change.  Neurological:  Negative for dizziness, weakness, light-headedness, numbness and headaches.  Hematological:  Negative for adenopathy. Does not bruise/bleed easily.    Objective:  BP 126/72 (BP Location: Left Arm, Patient Position: Sitting, Cuff Size: Normal)   Pulse 64   Temp  98.4 F (36.9 C) (Oral)   Ht 5' 6.5" (1.689 m)   Wt 134 lb (60.8 kg)   SpO2 96%   BMI 21.30 kg/m   BP Readings from Last 3 Encounters:  06/10/22 126/72  03/03/22 (!) 152/76  10/29/21 102/64    Wt Readings from Last 3 Encounters:  06/10/22 134 lb (60.8 kg)  03/03/22 135 lb (61.2 kg)  10/29/21 130 lb (59 kg)    Physical Exam Vitals reviewed.  Constitutional:      Appearance: Normal appearance.  HENT:     Nose: Nose normal.     Mouth/Throat:     Mouth: Mucous membranes are moist.  Eyes:     General: No scleral icterus.    Conjunctiva/sclera: Conjunctivae normal.  Cardiovascular:     Rate and Rhythm: Regular rhythm. Bradycardia present.     Heart sounds: Normal heart sounds, S1 normal and S2 normal. No murmur heard.    No friction rub. No gallop.     Comments: EKG- SB with SA ?LAE No LVH or Q waves Pulmonary:     Breath sounds: No stridor. No wheezing, rhonchi or rales.  Abdominal:     General: Abdomen is flat. Bowel sounds are normal. There is abdominal bruit. There is no distension.     Palpations: Abdomen is soft. There is no hepatomegaly, splenomegaly or mass.     Tenderness: There is no abdominal tenderness. There is no guarding.  Musculoskeletal:        General: No swelling.  Cervical back: Neck supple.     Right lower leg: No edema.     Left lower leg: No edema.  Lymphadenopathy:     Cervical: No cervical adenopathy.  Skin:    General: Skin is warm and dry.  Neurological:     General: No focal deficit present.     Mental Status: She is alert. Mental status is at baseline.  Psychiatric:        Mood and Affect: Mood normal.        Behavior: Behavior normal.     Lab Results  Component Value Date   WBC 4.8 06/10/2022   HGB 12.0 06/10/2022   HCT 36.6 06/10/2022   PLT 189.0 06/10/2022   GLUCOSE 94 06/10/2022   CHOL 169 06/10/2022   TRIG 44.0 06/10/2022   HDL 63.20 06/10/2022   LDLCALC 97 06/10/2022   ALT 8 03/03/2022   AST 22 03/03/2022   NA  137 06/10/2022   K 3.5 06/10/2022   CL 101 06/10/2022   CREATININE 0.81 06/10/2022   BUN 24 (H) 06/10/2022   CO2 30 06/10/2022   TSH 0.62 06/10/2022    DG Humerus Right  Result Date: 06/04/2021 CLINICAL DATA:  Upper arm pain EXAM: RIGHT HUMERUS - 2+ VIEW COMPARISON:  None. FINDINGS: There is no evidence of fracture or other focal bone lesions. Soft tissues are unremarkable. IMPRESSION: Negative. Electronically Signed   By: Donavan Foil M.D.   On: 06/04/2021 19:43    Assessment & Plan:   Jahanna was seen today for anemia, annual exam, hypertension and hyperlipidemia.  Diagnoses and all orders for this visit:  Essential hypertension- Her blood pressure is well controlled. -     Basic metabolic panel; Future -     TSH; Future -     EKG 12-Lead -     TSH -     Basic metabolic panel  Bradycardia, sinus- She is asymptomatic with this. -     TSH; Future -     EKG 12-Lead -     TSH  Iron deficiency anemia secondary to inadequate dietary iron intake- Her H/H are normal. -     CBC with Differential/Platelet; Future -     CBC with Differential/Platelet  Other dietary vitamin B12 deficiency anemia- Her H&H are normal. -     CBC with Differential/Platelet; Future -     CBC with Differential/Platelet  Hyperlipidemia with target LDL less than 130- Her ASCVD risk score is only 10%.  I do not recommend that she start a statin at this time. -     Lipid panel; Future -     TSH; Future -     TSH -     Lipid panel  Abdominal bruit- Will screen for AAA. -     VAS [place] AAA DUPLEX; Future  DOE (dyspnea on exertion)- Her labs are normal.  I recommended that she undergo a calcium score to screen for atherosclerosis.  If she has an elevated score then I will consider starting a statin. -     Troponin I (High Sensitivity); Future -     Brain natriuretic peptide; Future -     Brain natriuretic peptide -     Troponin I (High Sensitivity) -     CT CARDIAC SCORING (SELF PAY ONLY);  Future  Encounter for general adult medical examination with abnormal findings- Exam completed, labs reviewed, vaccines reviewed- she refused a pneumonia vaccine and shingles vaccine, cancer screenings are up-to-date, patient education was  given.   I have discontinued Deetya Drouillard. Duddy's Crisaborole. I am also having her maintain her vitamin C, Geritol, cyanocobalamin, VITAMIN D PO, brimonidine, and chlorthalidone.  No orders of the defined types were placed in this encounter.    Follow-up: Return in about 3 months (around 09/10/2022).  Scarlette Calico, MD

## 2022-06-17 ENCOUNTER — Ambulatory Visit (HOSPITAL_COMMUNITY)
Admission: RE | Admit: 2022-06-17 | Discharge: 2022-06-17 | Disposition: A | Discharge status: Home / Self Care | Payer: Medicare HMO | Source: Ambulatory Visit | Attending: Internal Medicine | Admitting: Internal Medicine

## 2022-06-17 DIAGNOSIS — R0989 Other specified symptoms and signs involving the circulatory and respiratory systems: Secondary | ICD-10-CM | POA: Diagnosis not present

## 2022-06-24 DIAGNOSIS — H25813 Combined forms of age-related cataract, bilateral: Secondary | ICD-10-CM | POA: Diagnosis not present

## 2022-06-24 DIAGNOSIS — H401123 Primary open-angle glaucoma, left eye, severe stage: Secondary | ICD-10-CM | POA: Diagnosis not present

## 2022-06-24 DIAGNOSIS — H401112 Primary open-angle glaucoma, right eye, moderate stage: Secondary | ICD-10-CM | POA: Diagnosis not present

## 2022-07-18 LAB — GLUCOSE, POCT (MANUAL RESULT ENTRY): POC Glucose: 162 mg/dl — AB (ref 70–99)

## 2022-07-27 ENCOUNTER — Ambulatory Visit: Payer: Self-pay | Admitting: Licensed Clinical Social Worker

## 2022-07-27 NOTE — Patient Outreach (Signed)
  Care Coordination   07/27/2022 Name: SHADONNA BENEDICK MRN: [id number] DOB: [birth year]   Care Coordination Outreach Attempts:  An unsuccessful telephone outreach was attempted today to offer the patient information about available care coordination services as a benefit of their health plan.   Follow Up Plan:  Additional outreach attempts will be made to offer the patient care coordination information and services.   Encounter Outcome:  No Answer  Care Coordination Interventions Activated:  No   Care Coordination Interventions:  No, not indicated    Casimer Lanius, [place] [phone]

## 2022-08-04 ENCOUNTER — Other Ambulatory Visit: Payer: Self-pay | Admitting: Obstetrics & Gynecology

## 2022-08-04 ENCOUNTER — Ambulatory Visit (INDEPENDENT_AMBULATORY_CARE_PROVIDER_SITE_OTHER): Payer: Medicare HMO

## 2022-08-04 DIAGNOSIS — Z780 Asymptomatic menopausal state: Secondary | ICD-10-CM | POA: Diagnosis not present

## 2022-08-04 DIAGNOSIS — M810 Age-related osteoporosis without current pathological fracture: Secondary | ICD-10-CM

## 2022-08-04 DIAGNOSIS — Z13820 Encounter for screening for osteoporosis: Secondary | ICD-10-CM

## 2022-09-02 ENCOUNTER — Telehealth: Payer: Self-pay

## 2022-09-02 NOTE — Patient Outreach (Signed)
  Care Coordination   Initial Visit Note   09/02/2022 Name: Susan Mccarty MRN: [id number] DOB: 1951/09/19  Susan Mccarty is a 71 y.o. year old female who sees Janith Lima, MD for primary care. I  called to discuss care coordination services. Patient declines to verify HIPAA. Patient states she will speak with Primary care Provider to verify the service.    Goals Addressed             This Visit's Progress    COMPLETED: Care Coordination Activities-no follow up required       Care Coordination Interventions: Discussed care coordination program Encouraged patient to discuss with provider and provided care coordinator contact number and encouraged to contact if needs change.         SDOH assessments and interventions completed:  No  Care Coordination Interventions Activated:  Yes  Care Coordination Interventions:  Yes, provided   Follow up plan: No further intervention required.   Encounter Outcome:  Pt. Refused   Thea Silversmith, RN, MSN, BSN, [place] Coordinator [phone]

## 2022-09-09 ENCOUNTER — Ambulatory Visit
Admission: RE | Admit: 2022-09-09 | Discharge: 2022-09-09 | Disposition: A | Discharge status: Home / Self Care | Payer: No Typology Code available for payment source | Source: Ambulatory Visit | Attending: Internal Medicine | Admitting: Internal Medicine

## 2022-09-09 ENCOUNTER — Telehealth: Payer: Self-pay | Admitting: Internal Medicine

## 2022-09-09 DIAGNOSIS — R0609 Other forms of dyspnea: Secondary | ICD-10-CM

## 2022-09-09 DIAGNOSIS — I700 Atherosclerosis of aorta: Secondary | ICD-10-CM | POA: Diagnosis not present

## 2022-09-09 NOTE — Telephone Encounter (Signed)
N/A unable to leave a message for patient to call back to schedule Medicare Annual Wellness Visit   Last AWV  09/07/18  Please schedule at anytime with [facility] if patient calls the office back.     Any questions, please call me at [phone]

## 2022-09-10 ENCOUNTER — Encounter: Payer: Self-pay | Admitting: Internal Medicine

## 2022-09-16 ENCOUNTER — Telehealth: Payer: Self-pay | Admitting: Internal Medicine

## 2022-09-16 NOTE — Telephone Encounter (Signed)
PT calls today in regards to their recent CT they had done that was ordered by Dr.Jones. PT was informed that the results came back normal. PT is still dealing with the shortness of breath as well as occasional dizziness and with this, wanted to know what the next plan of action would be. PT is requesting Dr.Jones look over the CT imaging when possible.  CB: [phone]

## 2022-09-21 NOTE — Telephone Encounter (Signed)
Pt has requested to be scheduled on 10/18 due to prior obligations in the coming weeks.  I have her scheduled for 10/18 @ 9.20am.

## 2022-09-24 ENCOUNTER — Telehealth: Payer: Self-pay | Admitting: Internal Medicine

## 2022-09-24 NOTE — Telephone Encounter (Signed)
N/A when call to schedule AWV with [facility]

## 2022-10-06 ENCOUNTER — Ambulatory Visit: Payer: Medicare HMO | Admitting: Obstetrics & Gynecology

## 2022-10-06 ENCOUNTER — Encounter: Payer: Self-pay | Admitting: Obstetrics & Gynecology

## 2022-10-06 VITALS — BP 106/64 | HR 52

## 2022-10-06 DIAGNOSIS — M810 Age-related osteoporosis without current pathological fracture: Secondary | ICD-10-CM | POA: Diagnosis not present

## 2022-10-06 MED ORDER — ALENDRONATE SODIUM 70 MG PO TABS: 70.0000 mg | ORAL_TABLET | ORAL | 4 refills | Status: DC

## 2022-10-06 NOTE — Progress Notes (Signed)
    Susan Mccarty March 23, 1951 [id number]        71 y.o.  G1P0010   RP: Counseling and management of Osteoporosis   HPI: Bone Density 08/04/22 with Osteoporosis at many sites.  Lowest Lt total hip/Rt femoral neck T-Score -2.6.  Stable c/w BD in 02/2018.  Mother with h/o Hip fracture.  Not on any bone medication.  Not taking Ca++ daily.  Taking Vit D.  Walking daily.  No recent fall, good balance.  No h/o fragility fracture.   OB History  Gravida Para Term Preterm AB Living  1 0     1 0  SAB IAB Ectopic Multiple Live Births               # Outcome Date GA Lbr Len/2nd Weight Sex Delivery Anes PTL Lv  1 AB             Past medical history,surgical history, problem list, medications, allergies, family history and social history were all reviewed and documented in the EPIC chart.   Directed ROS with pertinent positives and negatives documented in the history of present illness/assessment and plan.  Exam:  Vitals:   10/06/22 0852  BP: 106/64  Pulse: (!) 52  SpO2: 98%   General appearance:  Normal  Bone Density 08/04/2022:  Osteoporosis Lt total hip/Rt femoral neck T-Score -2.6.  Stable c/w 02/2018.     Assessment/Plan:  71 y.o. G1P0010   1. Age-related osteoporosis without current pathological fracture Bone Density 08/04/22 with Osteoporosis at many sites.  Lowest Lt total hip/Rt femoral neck T-Score -2.6.  Stable c/w BD in 02/2018.  Mother with h/o Hip fracture.  Not on any bone medication.  Not taking Ca++ daily.  Taking Vit D.  Walking daily.  No recent fall, good balance.  No h/o fragility fracture.  Will optimize her Ca++ intake to 1.5 g/d total.  Will add Vit K2.  Start on Fosamax 70 mg PO weekly.  Usage, risks and benefits thoroughly reviewed including the risk of jaw necrosis.  Will stop Fosamax around major dental work.  Repeat BD in 2 years.  Other orders - alendronate (FOSAMAX) 70 MG tablet; Take 1 tablet (70 mg total) by mouth every 7 (seven) days. Take with a full glass of  water on an empty stomach.   [name] MD, 9:13 AM 10/06/2022

## 2022-10-07 ENCOUNTER — Ambulatory Visit: Payer: Medicare HMO | Admitting: Internal Medicine

## 2022-10-07 NOTE — Progress Notes (Signed)
Cardiology Office Note:    Date:  10/20/2022   ID:  Susan Mccarty, DOB [birth year], MRN [id number]  PCP:  Susan Lima, MD  [place] [facility] Cardiologist:  Susan Dawley, MD     Referring MD: Susan Lima, MD   Chief Complaint:  No chief complaint on file.     History of Present Illness:   Susan Mccarty is a 71 y.o. female   with a hx of sinus bradycardia, HTN and HLD.  I saw the patient with bradycardia and SOB when walking. The only medication she was on at that time was timolol for glaucoma. TTE 09/07/17 showed normal LVEF 55-60% with grade 1 DD mild TR and mildly elevated pulmonary pressure. Normal GXT 10/2018.    She saw Dr. Johney Mccarty 04/2021 and HR was 50's off timolol.      Patient says she gets tired after her work out in the am and lays down for 5 minutes and feels good. 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 Denies syncope. Hasn't happened in several weeks. Occasional DOE with dizziness eases with rest.     Past Medical History:  Diagnosis Date   Bradycardia 08/17/2017   Bradycardia, sinus 06/02/2016   Deficiency anemia 08/17/2017   Essential hypertension 06/02/2016   Fibroid, uterine 09/17/2016   Glaucoma    Hyperlipidemia with target LDL less than 130 05/17/2014   [place] risk score is 4%   Hypokalemia 05/11/2017   Iron deficiency anemia secondary to inadequate dietary iron intake 08/19/2017   Loss of weight 09/29/2016   Osteoporosis 01/2018   T score -2.5   Osteoporosis    Other dietary vitamin B12 deficiency anemia 08/19/2017   Postmenopausal postcoital bleeding 09/29/2016   Routine health maintenance 09/26/2011   Visit for screening mammogram 05/27/2015   Vitamin D deficiency    Weight loss, non-intentional 09/17/2016   Current Medications: Current Meds  Medication Sig   alendronate (FOSAMAX) 70 MG tablet Take 1 tablet (70 mg total) by mouth every 7 (seven) days. Take with a full glass of water on  an empty stomach.   brimonidine (ALPHAGAN) 0.2 % ophthalmic solution 1 drop 2 (two) times daily.   chlorthalidone (HYGROTON) 25 MG tablet TAKE 1 TABLET(25 MG) BY MOUTH DAILY   cyanocobalamin 2000 MCG tablet Take 1 tablet (2,000 mcg total) by mouth daily.   dorzolamide (TRUSOPT) 2 % ophthalmic solution Place 1 drop into both eyes 2 (two) times daily.   Iron-Vitamins (GERITOL) LIQD Take by mouth daily.   Netarsudil-Latanoprost (ROCKLATAN) 0.02-0.005 % SOLN Apply 1 drop to eye daily at 6 (six) AM.   vitamin C (ASCORBIC ACID) 500 MG tablet Take 500 mg by mouth daily.   VITAMIN D PO Take 5,000 Int'l Units by mouth daily.    Allergies:   Patient has no known allergies.   Social History   Tobacco Use   Smoking status: Never    Passive exposure: Never   Smokeless tobacco: Never  Vaping Use   Vaping Use: Never used  Substance Use Topics   Alcohol use: Yes    Comment: occ   Drug use: No    Family Hx: The patient's family history includes Alcohol abuse in her mother; Diabetes in her maternal grandmother, mother, and sister; Heart attack in her maternal grandmother; Hypertension in her father and sister.  ROS     Physical Exam:    VS:  BP 136/70   Pulse (!) 57   Ht  5' 6.5" (1.689 m)   Wt 133 lb 9.6 oz (60.6 kg)   SpO2 100%   BMI 21.24 kg/m     Wt Readings from Last 3 Encounters:  10/20/22 133 lb 9.6 oz (60.6 kg)  06/10/22 134 lb (60.8 kg)  03/03/22 135 lb (61.2 kg)    Physical Exam  GEN: Thin, in no acute distress  Neck: no JVD, carotid bruits, or masses Cardiac:RRR; no murmurs, rubs, or gallops  Respiratory:  clear to auscultation bilaterally, normal work of breathing GI: soft, nontender, nondistended, + BS Ext: without cyanosis, clubbing, or edema, Good distal pulses bilaterally Neuro:  Alert and Oriented x 3, Psych: euthymic mood, full affect        EKGs/Labs/Other Test Reviewed:    EKG:  EKG is  not ordered today.     Recent Labs: 03/03/2022: ALT 8 06/10/2022:  BUN 24; Creatinine, Ser 0.81; Hemoglobin 12.0; Platelets 189.0; Potassium 3.5; Pro B Natriuretic peptide (BNP) 77.0; Sodium 137; TSH 0.62   Recent Lipid Panel Recent Labs    06/10/22 0921  CHOL 169  TRIG 44.0  HDL 63.20  VLDL 8.8  LDLCALC 97     Prior CV Studies:    Cardiac monitor 08/2019:   Sinus bradycardia to sinus tachycardia. Average heart rate 51 BPM.   Sinus bradycardia to sinus tachycardia. Frequent significant bradycardia in low 40' during awake hours. I would recommend to switch timolol to another medication. She should discuss with her ophthalmologist.    11/15/18 ETT: Blood pressure demonstrated a hypertensive response to exercise. There was no ST segment deviation noted during stress.   ETT with fair exercise tolerance (6:30); no chest pain; hypertensive BP response; no ST changes; negative adequate ETT; Duke treadmill score 6.       Risk Assessment/Calculations/Metrics:              ASSESSMENT & PLAN:   No problem-specific Assessment & Plan notes found for this encounter. Sinus Bradycardia: Patient with history of sinus bradycardia thought to be secondary to timolol.  HR has improved to 50s . She has some DOE and dizziness with exercise or after.  Marland Kitchen ECG with no blocks. ETT in 2019 normal. TTE 2018 with LVEF 55-60%, G1DD, no significant valve disease. TSH normal 05/2022 -will place 2 week Zio. Consider stress test in future   HTN: Well controlled and BP mainly 110-130s. -Continue chlorthalidone '25mg'$  daily   HLD: Well controlled with LDL 97 -Diet and lifestyle modifications; not on statins   Glaucoma: Followed by Ophthalmologist.                Dispo:  No follow-ups on file.   Medication Adjustments/Labs and Tests Ordered: Current medicines are reviewed at length with the patient today.  Concerns regarding medicines are outlined above.  Tests Ordered: No orders of the defined types were placed in this encounter.  Medication Changes: No  orders of the defined types were placed in this encounter.  Signed, Ermalinda Barrios, PA-C  10/20/2022 1:24 PM    [address], [place], [place]  [address] Phone: [phone]; Fax: [phone]

## 2022-10-20 ENCOUNTER — Ambulatory Visit: Payer: Medicare HMO

## 2022-10-20 ENCOUNTER — Encounter: Payer: Self-pay | Admitting: Physician Assistant

## 2022-10-20 ENCOUNTER — Ambulatory Visit (INDEPENDENT_AMBULATORY_CARE_PROVIDER_SITE_OTHER): Payer: Medicare HMO

## 2022-10-20 ENCOUNTER — Ambulatory Visit: Payer: Medicare HMO | Attending: Physician Assistant | Admitting: Physician Assistant

## 2022-10-20 ENCOUNTER — Other Ambulatory Visit: Payer: Self-pay | Admitting: *Deleted

## 2022-10-20 VITALS — BP 136/70 | HR 57 | Ht 66.5 in | Wt 133.6 lb

## 2022-10-20 DIAGNOSIS — I10 Essential (primary) hypertension: Secondary | ICD-10-CM | POA: Diagnosis not present

## 2022-10-20 DIAGNOSIS — R001 Bradycardia, unspecified: Secondary | ICD-10-CM

## 2022-10-20 DIAGNOSIS — E785 Hyperlipidemia, unspecified: Secondary | ICD-10-CM | POA: Diagnosis not present

## 2022-10-20 NOTE — Patient Instructions (Addendum)
Medication Instructions:  Your physician recommends that you continue on your current medications as directed. Please refer to the Current Medication list given to you today.  *If you need a refill on your cardiac medications before your next appointment, please call your pharmacy*   Lab Work: None If you have labs (blood work) drawn today and your tests are completely normal, you will receive your results only by: [place] (if you have MyChart) OR A paper copy in the mail If you have any lab test that is abnormal or we need to change your treatment, we will call you to review the results.   Follow-Up: At [facility], you and your health needs are our priority.  As part of our continuing mission to provide you with exceptional heart care, we have created designated Provider Care Teams.  These Care Teams include your primary Cardiologist (physician) and Advanced Practice Providers (APPs -  Physician Assistants and Nurse Practitioners) who all work together to provide you with the care you need, when you need it.  We recommend signing up for the patient portal called "MyChart".  Sign up information is provided on this After Visit Summary.  MyChart is used to connect with patients for Virtual Visits (Telemedicine).  Patients are able to view lab/test results, encounter notes, upcoming appointments, etc.  Non-urgent messages can be sent to your provider as well.   To learn more about what you can do with MyChart, go to [email or web address].    Your next appointment:   1 year(s)  The format for your next appointment:   In Person  Provider:   Gwyndolyn Kaufman, MD    [place] Monitor Instructions  Your physician has requested you wear a ZIO patch monitor for 14 days.  This is a single patch monitor. [employer] supplies one patch monitor per enrollment. Additional stickers are not available. Please do not apply patch if you will be having a Nuclear Stress Test,   Echocardiogram, Cardiac CT, MRI, or Chest Xray during the period you would be wearing the  monitor. The patch cannot be worn during these tests. You cannot remove and re-apply the  ZIO XT patch monitor.  Your ZIO patch monitor will be mailed 3 day USPS to your address on file. It may take 3-5 days  to receive your monitor after you have been enrolled.  Once you have received your monitor, please review the enclosed instructions. Your monitor  has already been registered assigning a specific monitor serial # to you.  Billing and Patient Assistance Program Information  We have supplied [employer] with any of your insurance information on file for billing purposes. [employer] offers a sliding scale Patient Assistance Program for patients that do not have  insurance, or whose insurance does not completely cover the cost of the ZIO monitor.  You must apply for the Patient Assistance Program to qualify for this discounted rate.  To apply, please call [employer] at [phone], select option 4, select option 2, ask to apply for  Patient Assistance Program. Theodore Demark will ask your household income, and how many people  are in your household. They will quote your out-of-pocket cost based on that information.  [employer] will also be able to set up a 67-month interest-free payment plan if needed.  Applying the monitor   Shave hair from upper left chest.  Hold abrader disc by orange tab. Rub abrader in 40 strokes over the upper left chest as  indicated in your monitor instructions.  Clean area with 4 enclosed alcohol pads. Let dry.  Apply patch as indicated in monitor instructions. Patch will be placed under collarbone on left  side of chest with arrow pointing upward.  Rub patch adhesive wings for 2 minutes. Remove white label marked "1". Remove the white  label marked "2". Rub patch adhesive wings for 2 additional minutes.  While looking in a mirror, press and release button in center of patch. A small  green light will  flash 3-4 times. This will be your only indicator that the monitor has been turned on.  Do not shower for the first 24 hours. You may shower after the first 24 hours.  Press the button if you feel a symptom. You will hear a small click. Record Date, Time and  Symptom in the Patient Logbook.  When you are ready to remove the patch, follow instructions on the last 2 pages of Patient  Logbook. Stick patch monitor onto the last page of Patient Logbook.  Place Patient Logbook in the blue and white box. Use locking tab on box and tape box closed  securely. The blue and white box has prepaid postage on it. Please place it in the mailbox as  soon as possible. Your physician should have your test results approximately 7 days after the  monitor has been mailed back to [facility].  Call [facility] [place] at [phone] if you have questions regarding  your ZIO XT patch monitor. Call them immediately if you see an orange light blinking on your  monitor.  If your monitor falls off in less than 4 days, contact our Monitor department at [phone].  If your monitor becomes loose or falls off after 4 days call [employer] at [phone] for  suggestions on securing your monitor

## 2022-10-20 NOTE — Progress Notes (Unsigned)
ZIO XT [id number] from office inventory applied to patient.

## 2022-10-20 NOTE — Progress Notes (Unsigned)
ZIO XT [id number] from office inventory applied to patient.  Dr. Johney Frame to read.

## 2022-11-03 ENCOUNTER — Ambulatory Visit (INDEPENDENT_AMBULATORY_CARE_PROVIDER_SITE_OTHER): Payer: Medicare HMO | Admitting: Internal Medicine

## 2022-11-03 ENCOUNTER — Encounter: Payer: Self-pay | Admitting: Internal Medicine

## 2022-11-03 VITALS — BP 128/70 | HR 57 | Temp 97.8°F | Resp 16 | Ht 66.5 in | Wt 133.0 lb

## 2022-11-03 DIAGNOSIS — I10 Essential (primary) hypertension: Secondary | ICD-10-CM | POA: Diagnosis not present

## 2022-11-03 DIAGNOSIS — R001 Bradycardia, unspecified: Secondary | ICD-10-CM

## 2022-11-03 DIAGNOSIS — D508 Other iron deficiency anemias: Secondary | ICD-10-CM

## 2022-11-03 DIAGNOSIS — D513 Other dietary vitamin B12 deficiency anemia: Secondary | ICD-10-CM

## 2022-11-03 DIAGNOSIS — K635 Polyp of colon: Secondary | ICD-10-CM

## 2022-11-03 NOTE — Patient Instructions (Signed)
Hypertension, Adult High blood pressure (hypertension) is when the force of blood pumping through the arteries is too strong. The arteries are the blood vessels that carry blood from the heart throughout the body. Hypertension forces the heart to work harder to pump blood and may cause arteries to become narrow or stiff. Untreated or uncontrolled hypertension can lead to a heart attack, heart failure, a stroke, kidney disease, and other problems. A blood pressure reading consists of a higher number over a lower number. Ideally, your blood pressure should be below 120/80. The first ("top") number is called the systolic pressure. It is a measure of the pressure in your arteries as your heart beats. The second ("bottom") number is called the diastolic pressure. It is a measure of the pressure in your arteries as the heart relaxes. What are the causes? The exact cause of this condition is not known. There are some conditions that result in high blood pressure. What increases the risk? Certain factors may make you more likely to develop high blood pressure. Some of these risk factors are under your control, including: Smoking. Not getting enough exercise or physical activity. Being overweight. Having too much fat, sugar, calories, or salt (sodium) in your diet. Drinking too much alcohol. Other risk factors include: Having a personal history of heart disease, diabetes, high cholesterol, or kidney disease. Stress. Having a family history of high blood pressure and high cholesterol. Having obstructive sleep apnea. Age. The risk increases with age. What are the signs or symptoms? High blood pressure may not cause symptoms. Very high blood pressure (hypertensive crisis) may cause: Headache. Fast or irregular heartbeats (palpitations). Shortness of breath. Nosebleed. Nausea and vomiting. Vision changes. Severe chest pain, dizziness, and seizures. How is this diagnosed? This condition is diagnosed by  measuring your blood pressure while you are seated, with your arm resting on a flat surface, your legs uncrossed, and your feet flat on the floor. The cuff of the blood pressure monitor will be placed directly against the skin of your upper arm at the level of your heart. Blood pressure should be measured at least twice using the same arm. Certain conditions can cause a difference in blood pressure between your right and left arms. If you have a high blood pressure reading during one visit or you have normal blood pressure with other risk factors, you may be asked to: Return on a different day to have your blood pressure checked again. Monitor your blood pressure at home for 1 week or longer. If you are diagnosed with hypertension, you may have other blood or imaging tests to help your health care provider understand your overall risk for other conditions. How is this treated? This condition is treated by making healthy lifestyle changes, such as eating healthy foods, exercising more, and reducing your alcohol intake. You may be referred for counseling on a healthy diet and physical activity. Your health care provider may prescribe medicine if lifestyle changes are not enough to get your blood pressure under control and if: Your systolic blood pressure is above 130. Your diastolic blood pressure is above 80. Your personal target blood pressure may vary depending on your medical conditions, your age, and other factors. Follow these instructions at home: Eating and drinking  Eat a diet that is high in fiber and potassium, and low in sodium, added sugar, and fat. An example of this eating plan is called the DASH diet. DASH stands for Dietary Approaches to Stop Hypertension. To eat this way: Eat   plenty of fresh fruits and vegetables. Try to fill one half of your plate at each meal with fruits and vegetables. Eat whole grains, such as whole-wheat pasta, brown rice, or whole-grain bread. Fill about one  fourth of your plate with whole grains. Eat or drink low-fat dairy products, such as skim milk or low-fat yogurt. Avoid fatty cuts of meat, processed or cured meats, and poultry with skin. Fill about one fourth of your plate with lean proteins, such as fish, chicken without skin, beans, eggs, or tofu. Avoid pre-made and processed foods. These tend to be higher in sodium, added sugar, and fat. Reduce your daily sodium intake. Many people with hypertension should eat less than 1,500 mg of sodium a day. Do not drink alcohol if: Your health care provider tells you not to drink. You are pregnant, may be pregnant, or are planning to become pregnant. If you drink alcohol: Limit how much you have to: 0-1 drink a day for women. 0-2 drinks a day for men. Know how much alcohol is in your drink. In the [place] one drink equals one 12 oz bottle of beer (355 mL), one 5 oz glass of wine (148 mL), or one 1 oz glass of hard liquor (44 mL). Lifestyle  Work with your health care provider to maintain a healthy body weight or to lose weight. Ask what an ideal weight is for you. Get at least 30 minutes of exercise that causes your heart to beat faster (aerobic exercise) most days of the week. Activities may include walking, swimming, or biking. Include exercise to strengthen your muscles (resistance exercise), such as Pilates or lifting weights, as part of your weekly exercise routine. Try to do these types of exercises for 30 minutes at least 3 days a week. Do not use any products that contain nicotine or tobacco. These products include cigarettes, chewing tobacco, and vaping devices, such as e-cigarettes. If you need help quitting, ask your health care provider. Monitor your blood pressure at home as told by your health care provider. Keep all follow-up visits. This is important. Medicines Take over-the-counter and prescription medicines only as told by your health care provider. Follow directions carefully. Blood  pressure medicines must be taken as prescribed. Do not skip doses of blood pressure medicine. Doing this puts you at risk for problems and can make the medicine less effective. Ask your health care provider about side effects or reactions to medicines that you should watch for. Contact a health care provider if you: Think you are having a reaction to a medicine you are taking. Have headaches that keep coming back (recurring). Feel dizzy. Have swelling in your ankles. Have trouble with your vision. Get help right away if you: Develop a severe headache or confusion. Have unusual weakness or numbness. Feel faint. Have severe pain in your chest or abdomen. Vomit repeatedly. Have trouble breathing. These symptoms may be an emergency. Get help right away. Call 911. Do not wait to see if the symptoms will go away. Do not drive yourself to the hospital. Summary Hypertension is when the force of blood pumping through your arteries is too strong. If this condition is not controlled, it may put you at risk for serious complications. Your personal target blood pressure may vary depending on your medical conditions, your age, and other factors. For most people, a normal blood pressure is less than 120/80. Hypertension is treated with lifestyle changes, medicines, or a combination of both. Lifestyle changes include losing weight, eating a healthy,   low-sodium diet, exercising more, and limiting alcohol. This information is not intended to replace advice given to you by your health care provider. Make sure you discuss any questions you have with your health care provider. Document Revised: 10/14/2021 Document Reviewed: 10/14/2021 Elsevier Patient Education  2023 [employer]  

## 2022-11-03 NOTE — Progress Notes (Signed)
Subjective:  Patient ID: Susan Mccarty, female    DOB: 1950-12-31  Age: 71 y.o. MRN: [id number]  CC: Hypertension   HPI Susan Mccarty presents for f/up -   She complains of intermittent episodes of dizziness.  She is active and denies chest pain, shortness of breath, diaphoresis, or near syncope.  She was not willing to get any vaccines today.  Outpatient Medications Prior to Visit  Medication Sig Dispense Refill   alendronate (FOSAMAX) 70 MG tablet Take 1 tablet (70 mg total) by mouth every 7 (seven) days. Take with a full glass of water on an empty stomach. 12 tablet 4   brimonidine (ALPHAGAN) 0.2 % ophthalmic solution 1 drop 2 (two) times daily.     cyanocobalamin 2000 MCG tablet Take 1 tablet (2,000 mcg total) by mouth daily. 90 tablet 3   dorzolamide (TRUSOPT) 2 % ophthalmic solution Place 1 drop into both eyes 2 (two) times daily.     Iron-Vitamins (GERITOL) LIQD Take by mouth daily.     Netarsudil-Latanoprost (ROCKLATAN) 0.02-0.005 % SOLN Apply 1 drop to eye daily at 6 (six) AM.     vitamin C (ASCORBIC ACID) 500 MG tablet Take 500 mg by mouth daily.     VITAMIN D PO Take 5,000 Int'l Units by mouth daily.     chlorthalidone (HYGROTON) 25 MG tablet TAKE 1 TABLET(25 MG) BY MOUTH DAILY 90 tablet 1   No facility-administered medications prior to visit.    ROS Review of Systems  Constitutional: Negative.  Negative for diaphoresis and fatigue.  HENT: Negative.    Eyes: Negative.   Respiratory:  Negative for chest tightness and wheezing.   Cardiovascular:  Negative for chest pain, palpitations and leg swelling.  Gastrointestinal:  Negative for abdominal pain, constipation, diarrhea, nausea and vomiting.  Endocrine: Negative.   Genitourinary: Negative.  Negative for difficulty urinating.  Musculoskeletal: Negative.   Skin: Negative.   Allergic/Immunologic: Negative.   Neurological:  Positive for dizziness. Negative for syncope, weakness and light-headedness.   Psychiatric/Behavioral: Negative.      Objective:  BP 128/70 (BP Location: Left Arm, Patient Position: Sitting, Cuff Size: Large)   Pulse (!) 57   Temp 97.8 F (36.6 C) (Oral)   Resp 16   Ht 5' 6.5" (1.689 m)   Wt 133 lb (60.3 kg)   SpO2 99%   BMI 21.15 kg/m   BP Readings from Last 3 Encounters:  11/03/22 128/70  10/20/22 136/70  10/06/22 106/64    Wt Readings from Last 3 Encounters:  11/03/22 133 lb (60.3 kg)  10/20/22 133 lb 9.6 oz (60.6 kg)  06/10/22 134 lb (60.8 kg)    Physical Exam Vitals reviewed.  HENT:     Nose: Nose normal.     Mouth/Throat:     Mouth: Mucous membranes are moist.  Eyes:     General: No scleral icterus.    Conjunctiva/sclera: Conjunctivae normal.  Cardiovascular:     Rate and Rhythm: Regular rhythm. Bradycardia present.     Heart sounds: Normal heart sounds, S1 normal and S2 normal.  Pulmonary:     Effort: Pulmonary effort is normal.     Breath sounds: No stridor. No wheezing, rhonchi or rales.  Abdominal:     General: Abdomen is flat.     Palpations: There is no mass.     Tenderness: There is no abdominal tenderness. There is no guarding.     Hernia: No hernia is present.  Musculoskeletal:  General: Normal range of motion.     Cervical back: Neck supple.     Right lower leg: No edema.     Left lower leg: No edema.  Lymphadenopathy:     Cervical: No cervical adenopathy.  Skin:    General: Skin is warm and dry.  Neurological:     General: No focal deficit present.     Mental Status: She is alert. Mental status is at baseline.  Psychiatric:        Mood and Affect: Mood normal.        Behavior: Behavior normal.     Lab Results  Component Value Date   WBC 4.8 06/10/2022   HGB 12.0 06/10/2022   HCT 36.6 06/10/2022   PLT 189.0 06/10/2022   GLUCOSE 94 06/10/2022   CHOL 169 06/10/2022   TRIG 44.0 06/10/2022   HDL 63.20 06/10/2022   LDLCALC 97 06/10/2022   ALT 8 03/03/2022   AST 22 03/03/2022   NA 137 06/10/2022    K 3.5 06/10/2022   CL 101 06/10/2022   CREATININE 0.81 06/10/2022   BUN 24 (H) 06/10/2022   CO2 30 06/10/2022   TSH 0.62 06/10/2022    CT CARDIAC SCORING ([facility] LOCATIONS ONLY)  Result Date: 09/10/2022 CLINICAL DATA:  A 71 year old African American female with history of chest pain presents for coronary artery calcium scoring. * Tracking Code: [place] * EXAM: CT CARDIAC CORONARY ARTERY CALCIUM SCORE TECHNIQUE: Non-contrast imaging through the heart was performed using prospective ECG gating. Image post processing was performed on an independent workstation, allowing for quantitative analysis of the heart and coronary arteries. Note that this exam targets the heart and the chest was not imaged in its entirety. Radiation dose reduction: This exam was performed according to the departmental dose-optimization program which includes automated exposure control, adjustment of the mA and/or kV according to patient size and/or use of iterative reconstruction technique. COMPARISON:  None available FINDINGS: CORONARY CALCIUM SCORES: Left Main: 0 LAD: 0 LCx: 0 RCA: 0 Total Agatston Score: 0 MESA database percentile: 0 AORTA MEASUREMENTS: Ascending Aorta: 2.9 cm Descending Aorta:2.4 cm OTHER FINDINGS: Cardiovascular: Mild calcified aortic atherosclerotic changes. Normal heart size. Normal caliber of the thoracic aorta and central pulmonary vessels to the extent evaluated. Mediastinum/Nodes: RIGHT middle lobe nodule (image 48/9) 5 mm. This appears triangular and is seen along the fissure in the RIGHT mid chest. No effusion. No consolidation. Airways are patent to the extent evaluated. Lungs/Pleura: No adenopathy or acute process in the mediastinum. Upper Abdomen: Incidental imaging of upper abdominal contents without acute process. Musculoskeletal: No acute bone finding. No destructive bone process. Spinal degenerative changes. Degenerative changes are mild. IMPRESSION: 1. Coronary artery calcium score of 0. No calcified  coronary artery disease. 2. 5 mm right solid pulmonary nodule. Per Fleischner Society Guidelines, no routine follow-up imaging is recommended. This is more likely a small intrapulmonary lymph node. These guidelines do not apply to immunocompromised patients and patients with cancer. Follow up in patients with significant comorbidities as clinically warranted. For lung cancer screening, adhere to Lung-RADS guidelines. Reference: Radiology. 2017; 284(1):228-43. 3. Aortic atherosclerosis. Aortic Atherosclerosis (ICD10-I70.0). Electronically Signed   By: Zetta Bills M.D.   On: 09/10/2022 10:03    Assessment & Plan:   Marimar was seen today for hypertension.  Diagnoses and all orders for this visit:  Essential hypertension- Her blood pressure is overcontrolled and she is symptomatic.  Will discontinue chlorthalidone.  Other dietary vitamin B12 deficiency anemia  Iron deficiency anemia  secondary to inadequate dietary iron intake  Polyp of colon, unspecified part of colon, unspecified type -     Ambulatory referral to Gastroenterology  Bradycardia, sinus- She is asymptomatic and is wearing a heart monitor.   I have discontinued Jenniffer Vessels. Piche's chlorthalidone. I am also having her maintain her ascorbic acid, Geritol, cyanocobalamin, VITAMIN D PO, brimonidine, alendronate, Rocklatan, and dorzolamide.  No orders of the defined types were placed in this encounter.    Follow-up: Return in about 6 months (around 05/04/2023).  Scarlette Calico, MD

## 2022-11-10 DIAGNOSIS — H401123 Primary open-angle glaucoma, left eye, severe stage: Secondary | ICD-10-CM | POA: Diagnosis not present

## 2022-11-10 DIAGNOSIS — H25813 Combined forms of age-related cataract, bilateral: Secondary | ICD-10-CM | POA: Diagnosis not present

## 2022-11-10 DIAGNOSIS — H401112 Primary open-angle glaucoma, right eye, moderate stage: Secondary | ICD-10-CM | POA: Diagnosis not present

## 2022-11-11 ENCOUNTER — Encounter: Payer: Self-pay | Admitting: Internal Medicine

## 2022-11-11 DIAGNOSIS — R001 Bradycardia, unspecified: Secondary | ICD-10-CM | POA: Diagnosis not present

## 2022-11-16 ENCOUNTER — Telehealth: Payer: Self-pay

## 2022-11-16 DIAGNOSIS — I471 Supraventricular tachycardia, unspecified: Secondary | ICD-10-CM

## 2022-11-16 NOTE — Telephone Encounter (Signed)
-----   Message from [name], PA-C sent at 11/16/2022  3:48 PM EST ----- Monitor showed mostly normal rhythm but some long fast heart beats. We can't add anything for this because of bradycardia. Please refer to EPS for further recommendations of SVT. thanks

## 2022-12-01 ENCOUNTER — Encounter: Payer: Self-pay | Admitting: Physician Assistant

## 2022-12-28 ENCOUNTER — Other Ambulatory Visit: Payer: Self-pay | Admitting: Internal Medicine

## 2022-12-28 DIAGNOSIS — I10 Essential (primary) hypertension: Secondary | ICD-10-CM

## 2023-02-16 ENCOUNTER — Ambulatory Visit (INDEPENDENT_AMBULATORY_CARE_PROVIDER_SITE_OTHER): Payer: Medicare HMO

## 2023-02-16 VITALS — BP 162/74 | Ht 66.5 in | Wt 142.0 lb

## 2023-02-16 DIAGNOSIS — Z0000 Encounter for general adult medical examination without abnormal findings: Secondary | ICD-10-CM | POA: Diagnosis not present

## 2023-02-16 DIAGNOSIS — Z1211 Encounter for screening for malignant neoplasm of colon: Secondary | ICD-10-CM

## 2023-02-16 NOTE — Addendum Note (Signed)
Addended by: Sheral Flow on: 02/16/2023 10:12 AM   Modules accepted: Orders

## 2023-02-16 NOTE — Progress Notes (Signed)
I connected with  Susan Mccarty on 02/16/2023 at 9:15 a.m. EST by telephone and verified that I am speaking with the correct person using two identifiers.  Location: Patient: Home Provider: Viola Persons participating in the virtual visit: [place]   I discussed the limitations, risks, security and privacy concerns of performing an evaluation and management service by telephone and the availability of in person appointments. The patient expressed understanding and agreed to proceed.  Interactive audio and video telecommunications were attempted between this nurse and patient, however failed, due to patient having technical difficulties OR patient did not have access to video capability.  We continued and completed visit with audio only.  Some vital signs may be absent or patient reported.   Sheral Flow, LPN  Subjective:   Susan Mccarty is a 72 y.o. female who presents for Medicare Annual (Subsequent) preventive examination.  Review of Systems     Cardiac Risk Factors include: advanced age (>71mn, >>13women);dyslipidemia;hypertension;family history of premature cardiovascular disease     Objective:    Today's Vitals   02/16/23 0918  BP: (!) 162/74  Weight: 142 lb (64.4 kg)  Height: 5' 6.5" (1.689 m)  PainSc: 0-No pain   Body mass index is 22.58 kg/m.     02/16/2023    9:20 AM 09/07/2018   11:20 AM 09/30/2015    9:37 AM  Advanced Directives  Does Patient Have a Medical Advance Directive? Yes No No  Type of AParamedicof ADexterLiving will    Copy of HHavre de Gracein Chart? No - copy requested    Would patient like information on creating a medical advance directive?  Yes (ED - Information included in AVS)     Current Medications (verified) Outpatient Encounter Medications as of 02/16/2023  Medication Sig   alendronate (FOSAMAX) 70 MG tablet Take 1 tablet (70 mg total) by mouth every 7 (seven)  days. Take with a full glass of water on an empty stomach.   brimonidine (ALPHAGAN) 0.2 % ophthalmic solution 1 drop 2 (two) times daily.   cyanocobalamin 2000 MCG tablet Take 1 tablet (2,000 mcg total) by mouth daily.   dorzolamide (TRUSOPT) 2 % ophthalmic solution Place 1 drop into both eyes 2 (two) times daily.   Iron-Vitamins (GERITOL) LIQD Take by mouth daily.   Netarsudil-Latanoprost (ROCKLATAN) 0.02-0.005 % SOLN Apply 1 drop to eye daily at 6 (six) AM.   vitamin C (ASCORBIC ACID) 500 MG tablet Take 500 mg by mouth daily.   VITAMIN D PO Take 5,000 Int'l Units by mouth daily.   No facility-administered encounter medications on file as of 02/16/2023.    Allergies (verified) Patient has no known allergies.   History: Past Medical History:  Diagnosis Date   Bradycardia 08/17/2017   Bradycardia, sinus 06/02/2016   Deficiency anemia 08/17/2017   Essential hypertension 06/02/2016   Fibroid, uterine 09/17/2016   Glaucoma    Hyperlipidemia with target LDL less than 130 05/17/2014   F[place]ingham risk score is 4%   Hypokalemia 05/11/2017   Iron deficiency anemia secondary to inadequate dietary iron intake 08/19/2017   Loss of weight 09/29/2016   Osteoporosis 01/2018   T score -2.5   Osteoporosis    Other dietary vitamin B12 deficiency anemia 08/19/2017   Postmenopausal postcoital bleeding 09/29/2016   Routine health maintenance 09/26/2011   Visit for screening mammogram 05/27/2015   Vitamin D deficiency    Weight loss, non-intentional 09/17/2016   Past Surgical History:  Procedure Laterality Date   BREAST CYST EXCISION  2006   right    Family History  Problem Relation Age of Onset   Diabetes Mother    Alcohol abuse Mother    Hypertension Father    Diabetes Sister    Hypertension Sister    Diabetes Maternal Grandmother    Heart attack Maternal Grandmother    Social History   Socioeconomic History   Marital status: Single    Spouse name: Not on file   Number of  children: 0   Years of education: 12   Highest education level: Not on file  Occupational History   Occupation: SEAMSTRESS    Employer: [employer]  Tobacco Use   Smoking status: Never    Passive exposure: Never   Smokeless tobacco: Never  Vaping Use   Vaping Use: Never used  Substance and Sexual Activity   Alcohol use: Yes    Comment: occ   Drug use: No   Sexual activity: Yes    Partners: Male    Birth control/protection: Post-menopausal    Comment: 1st intercourse 72 yo-Fewer than 5 partners  Other Topics Concern   Not on file  Social History Narrative   HSG, no college. Single - in a relationship. No children. 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   Social Determinants of Health   Financial Resource Strain: Low Risk  (02/16/2023)   Overall Financial Resource Strain (CARDIA)    Difficulty of Paying Living Expenses: Not hard at all  Food Insecurity: No Food Insecurity (02/16/2023)   Hunger Vital Sign    Worried About Running Out of Food in the Last Year: Never true    Ran Out of Food in the Last Year: Never true  Transportation Needs: No Transportation Needs (02/16/2023)   PRAPARE - Hydrologist (Medical): No    Lack of Transportation (Non-Medical): No  Physical Activity: Sufficiently Active (02/16/2023)   Exercise Vital Sign    Days of Exercise per Week: 3 days    Minutes of Exercise per Session: 60 min  Stress: No Stress Concern Present (02/16/2023)   [place]    Feeling of Stress : Not at all  Social Connections: Moderately Integrated (02/16/2023)   Social Connection and Isolation Panel [NHANES]    Frequency of Communication with Friends and Family: More than three times a week    Frequency of Social Gatherings with Friends and Family: More than three times a week    Attends Religious Services: More than 4 times per year    Active Member of [employer] or  Organizations: Yes    Attends Music therapist: More than 4 times per year    Marital Status: Never married    Tobacco Counseling Counseling given: Not Answered   Clinical Intake:  Pre-visit preparation completed: Yes  Pain : No/denies pain Pain Score: 0-No pain     BMI - recorded: 22.58 Nutritional Status: BMI of 19-24  Normal Nutritional Risks: None Diabetes: No  How often do you need to have someone help you when you read instructions, pamphlets, or other written materials from your doctor or pharmacy?: 1 - Never What is the last grade level you completed in school?: HSG; Sewing Training  Diabetic? No  Interpreter Needed?: No  Information entered by :: Lisette Abu, LPN.   Activities of Daily Living    02/16/2023    9:24 AM  In your  present state of health, do you have any difficulty performing the following activities:  Hearing? 0  Vision? 0  Difficulty concentrating or making decisions? 1  Comment keeps notes  Walking or climbing stairs? 0  Dressing or bathing? 0  Doing errands, shopping? 0  Preparing Food and eating ? N  Using the Toilet? N  In the past six months, have you accidently leaked urine? Y  Comment no protection  Do you have problems with loss of bowel control? N  Managing your Medications? N  Managing your Finances? N  Housekeeping or managing your Housekeeping? N    Patient Care Team: Janith Lima, MD as PCP - General (Internal Medicine) Dorothy Spark, MD as PCP - Cardiology (Cardiology) Lorelle Gibbs, MD (Radiology)  Indicate any recent Medical Services you may have received from other than [facility] providers in the past year (date may be approximate).     Assessment:   This is a routine wellness examination for Aishani.  Hearing/Vision screen Hearing Screening - Comments:: Denies hearing difficulties   Vision Screening - Comments:: Wears rx glasses - up to date with routine eye exams with Warden Fillers, MD.   Dietary issues and exercise activities discussed: Current Exercise Habits: Home exercise routine, Type of exercise: walking;Other - see comments (exercise videos), Time (Minutes): 60, Frequency (Times/Week): 3, Weekly Exercise (Minutes/Week): 180, Intensity: Moderate, Exercise limited by: None identified   Goals Addressed             This Visit's Progress    My goal for 2024 is to get back into the gym and gain some weight.  My ideal weight is 150 lbs.        Depression Screen    02/16/2023    9:24 AM 06/10/2022    8:36 AM 05/07/2021    8:15 AM 01/16/2020    9:55 AM 09/29/2018    4:09 PM 09/07/2018   11:29 AM 05/11/2017   10:41 AM  PHQ 2/9 Scores  PHQ - 2 Score 0 1 0 0 0 0 0  PHQ- 9 Score  2 0    3    Fall Risk    02/16/2023    9:21 AM 06/10/2022    8:36 AM 05/07/2021    8:15 AM 01/16/2020    9:55 AM 09/29/2018    4:09 PM  Fall Risk   Falls in the past year? 0 1 0 0 No  Number falls in past yr: 0 1  0   Injury with Fall? 0 0  0   Risk for fall due to : No Fall Risks      Follow up Falls prevention discussed   Falls evaluation completed     FALL RISK PREVENTION PERTAINING TO THE HOME:  Any stairs in or around the home? No  If so, are there any without handrails? No  Home free of loose throw rugs in walkways, pet beds, electrical cords, etc? Yes  Adequate lighting in your home to reduce risk of falls? Yes   ASSISTIVE DEVICES UTILIZED TO PREVENT FALLS:  Life alert? No  Use of a cane, walker or w/c? No  Grab bars in the bathroom? No  Shower chair or bench in shower? No  Elevated toilet seat or a handicapped toilet? No   TIMED UP AND GO:  Was the test performed? No . Telephonic Visit  Cognitive Function:    09/07/2018   11:31 AM  MMSE - Mini Mental State Exam  Orientation to  time 5  Orientation to Place 5  Registration 3  Attention/ Calculation 5  Recall 3  Language- name 2 objects 2  Language- repeat 1  Language- follow 3 step command 3   Language- read & follow direction 1  Write a sentence 1  Copy design 1  Total score 30        02/16/2023    9:24 AM  6CIT Screen  What Year? 0 points  What month? 0 points  What time? 0 points  Count back from 20 0 points  Months in reverse 0 points  Repeat phrase 0 points  Total Score 0 points    Immunizations Immunization History  Administered Date(s) Administered   PFIZER(Purple Top)SARS-COV-2 Vaccination 04/29/2020, 05/20/2020, 11/26/2020   Tdap 09/04/2010, 08/06/2020    TDAP status: Up to date  Flu Vaccine status: Declined, Education has been provided regarding the importance of this vaccine but patient still declined. Advised may receive this vaccine at local pharmacy or Health Dept. Aware to provide a copy of the vaccination record if obtained from local pharmacy or Health Dept. Verbalized acceptance and understanding.  Pneumococcal vaccine status: Declined,  Education has been provided regarding the importance of this vaccine but patient still declined. Advised may receive this vaccine at local pharmacy or Health Dept. Aware to provide a copy of the vaccination record if obtained from local pharmacy or Health Dept. Verbalized acceptance and understanding.   Covid-19 vaccine status: Declined, Education has been provided regarding the importance of this vaccine but patient still declined. Advised may receive this vaccine at local pharmacy or Health Dept.or vaccine clinic. Aware to provide a copy of the vaccination record if obtained from local pharmacy or Health Dept. Verbalized acceptance and understanding.  Qualifies for Shingles Vaccine? Yes   Zostavax completed No   Shingrix Completed?: No.    Education has been provided regarding the importance of this vaccine. Patient has been advised to call insurance company to determine out of pocket expense if they have not yet received this vaccine. Advised may also receive vaccine at local pharmacy or Health Dept. Verbalized  acceptance and understanding.  Screening Tests Health Maintenance  Topic Date Due   Zoster Vaccines- Shingrix (1 of 2) Never done   Pneumonia Vaccine 30+ Years old (1 of 1 - PCV) Never done   COLONOSCOPY (Pts 45-37yr Insurance coverage will need to be confirmed)  10/22/2018   INFLUENZA VACCINE  Never done   COVID-19 Vaccine (4 - 2023-24 season) 08/21/2022   MAMMOGRAM  12/25/2023   Medicare Annual Wellness (AWV)  02/17/2024   DTaP/Tdap/Td (3 - Td or Tdap) 08/06/2030   DEXA SCAN  Completed   Hepatitis C Screening  Completed   HPV VACCINES  Aged Out    Health Maintenance  Health Maintenance Due  Topic Date Due   Zoster Vaccines- Shingrix (1 of 2) Never done   Pneumonia Vaccine 72 Years old (1 of 1 - PCV) Never done   COLONOSCOPY (Pts 45-484yrInsurance coverage will need to be confirmed)  10/22/2018   INFLUENZA VACCINE  Never done   COVID-19 Vaccine (4 - 2023-24 season) 08/21/2022    Colorectal cancer screening: Referral to GI placed 02/16/2023. Pt aware the office will call re: appt.  Mammogram status: Completed 12/24/2021. Repeat every year  Bone Density status: Completed 08/04/2022. Results reflect: Bone density results: OSTEOPOROSIS. Repeat every 2 years.  Lung Cancer Screening: (Low Dose CT Chest recommended if Age 72-80ears, 30 pack-year currently smoking OR have quit w/in  15years.) does not qualify.   Lung Cancer Screening Referral: no  Additional Screening:  Hepatitis C Screening: does qualify; Completed 10/08/2016  Vision Screening: Recommended annual ophthalmology exams for early detection of glaucoma and other disorders of the eye. Is the patient up to date with their annual eye exam?  Yes  Who is the provider or what is the name of the office in which the patient attends annual eye exams? Warden Fillers, MD. If pt is not established with a provider, would they like to be referred to a provider to establish care? No .   Dental Screening: Recommended annual  dental exams for proper oral hygiene  Community Resource Referral / Chronic Care Management: CRR required this visit?  No   CCM required this visit?  No      Plan:     I have personally reviewed and noted the following in the patient's chart:   Medical and social history Use of alcohol, tobacco or illicit drugs  Current medications and supplements including opioid prescriptions. Patient is not currently taking opioid prescriptions. Functional ability and status Nutritional status Physical activity Advanced directives List of other physicians Hospitalizations, surgeries, and ER visits in previous 12 months Vitals Screenings to include cognitive, depression, and falls Referrals and appointments  In addition, I have reviewed and discussed with patient certain preventive protocols, quality metrics, and best practice recommendations. A written personalized care plan for preventive services as well as general preventive health recommendations were provided to patient.     Sheral Flow, LPN   [id number]   Nurse Notes:  Normal cognitive status assessed by direct observation by this Nurse Health Advisor. No abnormalities found.  Patient provided blood pressure for this visit.

## 2023-02-16 NOTE — Patient Instructions (Addendum)
Susan Mccarty , Thank you for taking time to come for your Medicare Wellness Visit. I appreciate your ongoing commitment to your health goals. Please review the following plan we discussed and let me know if I can assist you in the future.   These are the goals we discussed:  Goals      My goal for 2024 is to get back into the gym and gain some weight.  My ideal weight is 150 lbs.        This is a list of the screening recommended for you and due dates:  Health Maintenance  Topic Date Due   Zoster (Shingles) Vaccine (1 of 2) Never done   Pneumonia Vaccine (1 of 1 - PCV) Never done   Colon Cancer Screening  10/22/2018   Flu Shot  Never done   COVID-19 Vaccine (4 - 2023-24 season) 08/21/2022   Mammogram  12/25/2023   Medicare Annual Wellness Visit  02/17/2024   DTaP/Tdap/Td vaccine (3 - Td or Tdap) 08/06/2030   DEXA scan (bone density measurement)  Completed   Hepatitis C Screening: USPSTF Recommendation to screen - Ages 36-79 yo.  Completed   HPV Vaccine  Aged Out    Advanced directives: Yes; Please bring a copy of your health care power of attorney and living will to the office at your convenience.  Conditions/risks identified: Yes; Hypertension  Next appointment: Follow up in one year for your annual wellness visit.   Preventive Care 72 Years and Older, Female Preventive care refers to lifestyle choices and visits with your health care provider that can promote health and wellness. What does preventive care include? A yearly physical exam. This is also called an annual well check. Dental exams once or twice a year. Routine eye exams. Ask your health care provider how often you should have your eyes checked. Personal lifestyle choices, including: Daily care of your teeth and gums. Regular physical activity. Eating a healthy diet. Avoiding tobacco and drug use. Limiting alcohol use. Practicing safe sex. Taking low-dose aspirin every day. Taking vitamin and mineral  supplements as recommended by your health care provider. What happens during an annual well check? The services and screenings done by your health care provider during your annual well check will depend on your age, overall health, lifestyle risk factors, and family history of disease. Counseling  Your health care provider may ask you questions about your: Alcohol use. Tobacco use. Drug use. Emotional well-being. Home and relationship well-being. Sexual activity. Eating habits. History of falls. Memory and ability to understand (cognition). Work and work Statistician. Reproductive health. Screening  You may have the following tests or measurements: Height, weight, and BMI. Blood pressure. Lipid and cholesterol levels. These may be checked every 5 years, or more frequently if you are over 45 years old. Skin check. Lung cancer screening. You may have this screening every year starting at age 63 if you have a 30-pack-year history of smoking and currently smoke or have quit within the past 15 years. Fecal occult blood test (FOBT) of the stool. You may have this test every year starting at age 40. Flexible sigmoidoscopy or colonoscopy. You may have a sigmoidoscopy every 5 years or a colonoscopy every 10 years starting at age 89. Hepatitis C blood test. Hepatitis B blood test. Sexually transmitted disease (STD) testing. Diabetes screening. This is done by checking your blood sugar (glucose) after you have not eaten for a while (fasting). You may have this done every 1-3 years. Bone density  scan. This is done to screen for osteoporosis. You may have this done starting at age 60. Mammogram. This may be done every 1-2 years. Talk to your health care provider about how often you should have regular mammograms. Talk with your health care provider about your test results, treatment options, and if necessary, the need for more tests. Vaccines  Your health care provider may recommend certain  vaccines, such as: Influenza vaccine. This is recommended every year. Tetanus, diphtheria, and acellular pertussis (Tdap, Td) vaccine. You may need a Td booster every 10 years. Zoster vaccine. You may need this after age 64. Pneumococcal 13-valent conjugate (PCV13) vaccine. One dose is recommended after age 44. Pneumococcal polysaccharide (PPSV23) vaccine. One dose is recommended after age 46. Talk to your health care provider about which screenings and vaccines you need and how often you need them. This information is not intended to replace advice given to you by your health care provider. Make sure you discuss any questions you have with your health care provider. Document Released: 01/03/2016 Document Revised: 08/26/2016 Document Reviewed: 10/08/2015 Elsevier Interactive Patient Education  2017 [place] Prevention in the Home Falls can cause injuries. They can happen to people of all ages. There are many things you can do to make your home safe and to help prevent falls. What can I do on the outside of my home? Regularly fix the edges of walkways and driveways and fix any cracks. Remove anything that might make you trip as you walk through a door, such as a raised step or threshold. Trim any bushes or trees on the path to your home. Use bright outdoor lighting. Clear any walking paths of anything that might make someone trip, such as rocks or tools. Regularly check to see if handrails are loose or broken. Make sure that both sides of any steps have handrails. Any raised decks and porches should have guardrails on the edges. Have any leaves, snow, or ice cleared regularly. Use sand or salt on walking paths during winter. Clean up any spills in your garage right away. This includes oil or grease spills. What can I do in the bathroom? Use night lights. Install grab bars by the toilet and in the tub and shower. Do not use towel bars as grab bars. Use non-skid mats or decals in  the tub or shower. If you need to sit down in the shower, use a plastic, non-slip stool. Keep the floor dry. Clean up any water that spills on the floor as soon as it happens. Remove soap buildup in the tub or shower regularly. Attach bath mats securely with double-sided non-slip rug tape. Do not have throw rugs and other things on the floor that can make you trip. What can I do in the bedroom? Use night lights. Make sure that you have a light by your bed that is easy to reach. Do not use any sheets or blankets that are too big for your bed. They should not hang down onto the floor. Have a firm chair that has side arms. You can use this for support while you get dressed. Do not have throw rugs and other things on the floor that can make you trip. What can I do in the kitchen? Clean up any spills right away. Avoid walking on wet floors. Keep items that you use a lot in easy-to-reach places. If you need to reach something above you, use a strong step stool that has a grab bar. Keep electrical  cords out of the way. Do not use floor polish or wax that makes floors slippery. If you must use wax, use non-skid floor wax. Do not have throw rugs and other things on the floor that can make you trip. What can I do with my stairs? Do not leave any items on the stairs. Make sure that there are handrails on both sides of the stairs and use them. Fix handrails that are broken or loose. Make sure that handrails are as long as the stairways. Check any carpeting to make sure that it is firmly attached to the stairs. Fix any carpet that is loose or worn. Avoid having throw rugs at the top or bottom of the stairs. If you do have throw rugs, attach them to the floor with carpet tape. Make sure that you have a light switch at the top of the stairs and the bottom of the stairs. If you do not have them, ask someone to add them for you. What else can I do to help prevent falls? Wear shoes that: Do not have high  heels. Have rubber bottoms. Are comfortable and fit you well. Are closed at the toe. Do not wear sandals. If you use a stepladder: Make sure that it is fully opened. Do not climb a closed stepladder. Make sure that both sides of the stepladder are locked into place. Ask someone to hold it for you, if possible. Clearly mark and make sure that you can see: Any grab bars or handrails. First and last steps. Where the edge of each step is. Use tools that help you move around (mobility aids) if they are needed. These include: Canes. Walkers. Scooters. Crutches. Turn on the lights when you go into a dark area. Replace any light bulbs as soon as they burn out. Set up your furniture so you have a clear path. Avoid moving your furniture around. If any of your floors are uneven, fix them. If there are any pets around you, be aware of where they are. Review your medicines with your doctor. Some medicines can make you feel dizzy. This can increase your chance of falling. Ask your doctor what other things that you can do to help prevent falls. This information is not intended to replace advice given to you by your health care provider. Make sure you discuss any questions you have with your health care provider. Document Released: 10/03/2009 Document Revised: 05/14/2016 Document Reviewed: 01/11/2015 Elsevier Interactive Patient Education  2017 [employer].

## 2023-04-06 DIAGNOSIS — H401123 Primary open-angle glaucoma, left eye, severe stage: Secondary | ICD-10-CM | POA: Diagnosis not present

## 2023-04-06 DIAGNOSIS — H401112 Primary open-angle glaucoma, right eye, moderate stage: Secondary | ICD-10-CM | POA: Diagnosis not present

## 2023-04-06 DIAGNOSIS — H25813 Combined forms of age-related cataract, bilateral: Secondary | ICD-10-CM | POA: Diagnosis not present

## 2023-05-06 ENCOUNTER — Telehealth: Payer: Self-pay

## 2023-05-06 NOTE — Telephone Encounter (Signed)
We received medical clearance for dental treatment form for patient. Form was faxed in 04-13-23 & patient was notified. A copy was sent to patient.

## 2023-06-07 ENCOUNTER — Ambulatory Visit (INDEPENDENT_AMBULATORY_CARE_PROVIDER_SITE_OTHER): Payer: Medicare HMO | Admitting: Internal Medicine

## 2023-06-07 ENCOUNTER — Encounter: Payer: Self-pay | Admitting: Internal Medicine

## 2023-06-07 VITALS — BP 180/80 | HR 48 | Temp 98.2°F | Ht 66.5 in | Wt 139.0 lb

## 2023-06-07 DIAGNOSIS — I10 Essential (primary) hypertension: Secondary | ICD-10-CM

## 2023-06-07 MED ORDER — CHLORTHALIDONE 15 MG PO TABS: 15.0000 mg | ORAL_TABLET | Freq: Every day | ORAL | 0 refills | Status: DC

## 2023-06-07 NOTE — Patient Instructions (Addendum)
We have started chlorthalidone to take 1 pill daily to help the blood pressure. Come back in 2-4 weeks to recheck the labs and pressure.

## 2023-06-07 NOTE — Progress Notes (Signed)
   Subjective:   Patient ID: Susan Mccarty, female    DOB: 04-22-1951, 72 y.o.   MRN: [id number]  Hypertension Associated symptoms include headaches. Pertinent negatives include no chest pain, palpitations or shortness of breath.   The patient is a 73 YO female coming in for blood pressure issues. High for some time and noted at [facility] not addressed or recommended visit.   Review of Systems  Constitutional: Negative.   HENT: Negative.    Eyes: Negative.   Respiratory:  Negative for cough, chest tightness and shortness of breath.   Cardiovascular:  Negative for chest pain, palpitations and leg swelling.  Gastrointestinal:  Negative for abdominal distention, abdominal pain, constipation, diarrhea, nausea and vomiting.  Musculoskeletal: Negative.   Skin: Negative.   Neurological:  Positive for headaches.  Psychiatric/Behavioral: Negative.      Objective:  Physical Exam Constitutional:      Appearance: She is well-developed.  HENT:     Head: Normocephalic and atraumatic.  Cardiovascular:     Rate and Rhythm: Normal rate and regular rhythm.  Pulmonary:     Effort: Pulmonary effort is normal. No respiratory distress.     Breath sounds: Normal breath sounds. No wheezing or rales.  Abdominal:     General: Bowel sounds are normal. There is no distension.     Palpations: Abdomen is soft.     Tenderness: There is no abdominal tenderness. There is no rebound.  Musculoskeletal:     Cervical back: Normal range of motion.  Skin:    General: Skin is warm and dry.  Neurological:     Mental Status: She is alert and oriented to person, place, and time.     Coordination: Coordination normal.     Vitals:   06/07/23 1009 06/07/23 1027  BP: (!) 180/100 (!) 180/80  Pulse: (!) 48   Temp: 98.2 F (36.8 C)   TempSrc: Oral   SpO2: 99%   Weight: 139 lb (63 kg)   Height: 5' 6.5" (1.689 m)     Assessment & Plan:

## 2023-06-07 NOTE — Assessment & Plan Note (Signed)
Rx chlorthalidone 15 mg daily as she tolerated this well in the past. She has been off BP medications for about 8 months or so and she does not recall why it was stopped. No recent change in diet and is exercising. Follow up PCP 2-4 weeks.

## 2023-06-09 ENCOUNTER — Telehealth: Payer: Self-pay | Admitting: Internal Medicine

## 2023-06-09 NOTE — Telephone Encounter (Signed)
Patient would also like to know what she can do in the meantime until that medication is filled. She would like a call back at [phone].

## 2023-06-09 NOTE — Telephone Encounter (Signed)
Patient called and said the blood pressure pill you called in  Costs too much.  She would like something else called in to [facility] [address]  wants her to be on hydrochlorithizide.

## 2023-06-10 ENCOUNTER — Other Ambulatory Visit: Payer: Self-pay

## 2023-06-10 MED ORDER — HYDROCHLOROTHIAZIDE 12.5 MG PO CAPS: 12.5000 mg | ORAL_CAPSULE | Freq: Every day | ORAL | 0 refills | Status: DC

## 2023-06-10 NOTE — Telephone Encounter (Signed)
Prescription has been sent in to changed pharmacy

## 2023-06-10 NOTE — Telephone Encounter (Signed)
Patient would like the medication sent to [facility] [address] in [place] instead.  Best callback is [phone].

## 2023-06-10 NOTE — Telephone Encounter (Signed)
Sent in alternative

## 2023-08-13 ENCOUNTER — Telehealth: Payer: Self-pay | Admitting: Internal Medicine

## 2023-08-13 NOTE — Telephone Encounter (Signed)
Patient was prescribed hydrochlorothiazide (MICROZIDE) 12.5 MG capsule for high blood pressure. She said she's still having issues with it getting high and would like to know if she should increase the amount she takes. Patient would like a call back at [phone].

## 2023-08-13 NOTE — Telephone Encounter (Signed)
Called pt inform she has to be seen before any meds can be increase. Made appt for Next 08/18/23.Susan Mccarty

## 2023-08-18 ENCOUNTER — Ambulatory Visit: Payer: Medicare HMO | Admitting: Internal Medicine

## 2023-08-31 ENCOUNTER — Ambulatory Visit: Payer: Medicare HMO | Admitting: Internal Medicine

## 2023-09-01 DIAGNOSIS — H401112 Primary open-angle glaucoma, right eye, moderate stage: Secondary | ICD-10-CM | POA: Diagnosis not present

## 2023-09-01 DIAGNOSIS — H25813 Combined forms of age-related cataract, bilateral: Secondary | ICD-10-CM | POA: Diagnosis not present

## 2023-09-01 DIAGNOSIS — H401123 Primary open-angle glaucoma, left eye, severe stage: Secondary | ICD-10-CM | POA: Diagnosis not present

## 2023-09-12 ENCOUNTER — Other Ambulatory Visit: Payer: Self-pay | Admitting: Internal Medicine

## 2023-10-06 ENCOUNTER — Ambulatory Visit: Payer: Medicare HMO | Admitting: Internal Medicine

## 2023-10-06 ENCOUNTER — Encounter: Payer: Self-pay | Admitting: Internal Medicine

## 2023-10-06 VITALS — BP 132/72 | HR 43 | Temp 98.3°F | Resp 16 | Ht 66.5 in | Wt 137.0 lb

## 2023-10-06 DIAGNOSIS — Z0001 Encounter for general adult medical examination with abnormal findings: Secondary | ICD-10-CM

## 2023-10-06 DIAGNOSIS — Z1211 Encounter for screening for malignant neoplasm of colon: Secondary | ICD-10-CM

## 2023-10-06 DIAGNOSIS — I10 Essential (primary) hypertension: Secondary | ICD-10-CM

## 2023-10-06 DIAGNOSIS — Z0000 Encounter for general adult medical examination without abnormal findings: Secondary | ICD-10-CM | POA: Diagnosis not present

## 2023-10-06 DIAGNOSIS — D513 Other dietary vitamin B12 deficiency anemia: Secondary | ICD-10-CM | POA: Diagnosis not present

## 2023-10-06 DIAGNOSIS — R001 Bradycardia, unspecified: Secondary | ICD-10-CM

## 2023-10-06 DIAGNOSIS — E785 Hyperlipidemia, unspecified: Secondary | ICD-10-CM | POA: Diagnosis not present

## 2023-10-06 DIAGNOSIS — E559 Vitamin D deficiency, unspecified: Secondary | ICD-10-CM

## 2023-10-06 LAB — BASIC METABOLIC PANEL
BUN: 19 mg/dL (ref 6–23)
CO2: 29 meq/L (ref 19–32)
Calcium: 9.4 mg/dL (ref 8.4–10.5)
Chloride: 103 meq/L (ref 96–112)
Creatinine, Ser: 0.82 mg/dL (ref 0.40–1.20)
GFR: 71.63 mL/min (ref >60.00)
Glucose, Bld: 87 mg/dL (ref 70–99)
Potassium: 3.8 meq/L (ref 3.5–5.1)
Sodium: 138 meq/L (ref 135–145)

## 2023-10-06 LAB — HEPATIC FUNCTION PANEL
ALT: 9 U/L (ref 0–35)
AST: 19 U/L (ref 0–37)
Albumin: 4 g/dL (ref 3.5–5.2)
Alkaline Phosphatase: 63 U/L (ref 39–117)
Bilirubin, Direct: 0.2 mg/dL (ref 0.0–0.3)
Total Bilirubin: 0.9 mg/dL (ref 0.2–1.2)
Total Protein: 7.1 g/dL (ref 6.0–8.3)

## 2023-10-06 LAB — LIPID PANEL
Cholesterol: 177 mg/dL (ref 0–200)
HDL: 64.1 mg/dL (ref >39.00)
LDL Cholesterol: 104 mg/dL — ABNORMAL HIGH (ref 0–99)
NonHDL: 112.84
Total CHOL/HDL Ratio: 3
Triglycerides: 44 mg/dL (ref 0.0–149.0)
VLDL: 8.8 mg/dL (ref 0.0–40.0)

## 2023-10-06 LAB — FOLATE: Folate: 15.4 ng/mL (ref >5.9)

## 2023-10-06 LAB — VITAMIN B12: Vitamin B-12: 327 pg/mL (ref 211–911)

## 2023-10-06 LAB — URINALYSIS, ROUTINE W REFLEX MICROSCOPIC
Bilirubin Urine: NEGATIVE
Hgb urine dipstick: NEGATIVE
Ketones, ur: NEGATIVE
Leukocytes,Ua: NEGATIVE
Nitrite: NEGATIVE
Specific Gravity, Urine: 1.02 (ref 1.000–1.030)
Total Protein, Urine: NEGATIVE
Urine Glucose: NEGATIVE
Urobilinogen, UA: 1 (ref 0.0–1.0)
pH: 6.5 (ref 5.0–8.0)

## 2023-10-06 LAB — CBC WITH DIFFERENTIAL/PLATELET
Basophils Absolute: 0 10*3/uL (ref 0.0–0.1)
Basophils Relative: 0.8 % (ref 0.0–3.0)
Eosinophils Absolute: 0.1 10*3/uL (ref 0.0–0.7)
Eosinophils Relative: 3.8 % (ref 0.0–5.0)
HCT: 37.7 % (ref 36.0–46.0)
Hemoglobin: 12.3 g/dL (ref 12.0–15.0)
Lymphocytes Relative: 33.2 % (ref 12.0–46.0)
Lymphs Abs: 1.1 10*3/uL (ref 0.7–4.0)
MCHC: 32.5 g/dL (ref 30.0–36.0)
MCV: 84.2 fL (ref 78.0–100.0)
Monocytes Absolute: 0.4 10*3/uL (ref 0.1–1.0)
Monocytes Relative: 12.8 % — ABNORMAL HIGH (ref 3.0–12.0)
Neutro Abs: 1.6 10*3/uL (ref 1.4–7.7)
Neutrophils Relative %: 49.4 % (ref 43.0–77.0)
Platelets: 251 10*3/uL (ref 150.0–400.0)
RBC: 4.48 Mil/uL (ref 3.87–5.11)
RDW: 13.7 % (ref 11.5–15.5)
WBC: 3.2 10*3/uL — ABNORMAL LOW (ref 4.0–10.5)

## 2023-10-06 LAB — VITAMIN D 25 HYDROXY (VIT D DEFICIENCY, FRACTURES): VITD: 35.86 ng/mL (ref 30.00–100.00)

## 2023-10-06 LAB — TSH: TSH: 0.99 u[IU]/mL (ref 0.35–5.50)

## 2023-10-06 NOTE — Progress Notes (Signed)
Subjective:  Patient ID: Susan Mccarty, female    DOB: 05-26-1951  Age: 72 y.o. MRN: [id number]  CC: Annual Exam   HPI Susan Mccarty presents for a CPX and f/up -----  Discussed the use of AI scribe software for clinical note transcription with the patient, who gave verbal consent to proceed.  History of Present Illness   The patient, with a history of hypertension, presented for a routine physical. She reported a recent episode of elevated blood pressure, which was managed by another physician with medication. The patient denied experiencing any symptoms such as headaches or blurred vision associated with the hypertensive episode.  The patient also reported a period of fatigue lasting a couple of weeks, which she attributed to low B12 levels. However, she denied any associated numbness, tingling, or other symptoms typically associated with B12 deficiency. She reported taking a range of vitamin supplements, including B12.  The patient underwent a mammogram in July, the results of which were reportedly normal. The patient did not recall the exact location where the mammogram was performed.  The patient declined all vaccinations, including flu, shingles, and pneumonia. She also reported a heart rate in the forties and mentioned a need to find a new cardiologist, as her last visit was a year ago.       Outpatient Medications Prior to Visit  Medication Sig Dispense Refill   alendronate (FOSAMAX) 70 MG tablet Take 1 tablet (70 mg total) by mouth every 7 (seven) days. Take with a full glass of water on an empty stomach. 12 tablet 4   brimonidine (ALPHAGAN) 0.2 % ophthalmic solution 1 drop 2 (two) times daily.     cyanocobalamin 2000 MCG tablet Take 1 tablet (2,000 mcg total) by mouth daily. 90 tablet 3   dorzolamide (TRUSOPT) 2 % ophthalmic solution Place 1 drop into both eyes 2 (two) times daily.     hydrochlorothiazide (MICROZIDE) 12.5 MG capsule TAKE 1 CAPSULE BY MOUTH EVERY DAY 90 capsule  0   Iron-Vitamins (GERITOL) LIQD Take by mouth daily.     Netarsudil-Latanoprost (ROCKLATAN) 0.02-0.005 % SOLN Apply 1 drop to eye daily at 6 (six) AM.     vitamin C (ASCORBIC ACID) 500 MG tablet Take 500 mg by mouth daily.     VITAMIN D PO Take 5,000 Int'l Units by mouth daily.     No facility-administered medications prior to visit.    ROS Review of Systems  Constitutional:  Positive for fatigue. Negative for appetite change, chills, diaphoresis and unexpected weight change.  HENT: Negative.    Eyes: Negative.  Negative for visual disturbance.  Respiratory:  Negative for cough, chest tightness, shortness of breath and wheezing.   Cardiovascular:  Negative for chest pain, palpitations and leg swelling.  Gastrointestinal:  Negative for abdominal pain, blood in stool, constipation, diarrhea, nausea and vomiting.  Genitourinary: Negative.  Negative for difficulty urinating.  Musculoskeletal: Negative.  Negative for arthralgias and myalgias.  Skin: Negative.   Neurological:  Negative for dizziness, syncope, weakness and light-headedness.    Objective:  BP 132/72 (BP Location: Left Arm, Patient Position: Sitting, Cuff Size: Large)   Pulse (!) 43   Temp 98.3 F (36.8 C) (Oral)   Resp 16   Ht 5' 6.5" (1.689 m)   Wt 137 lb (62.1 kg)   SpO2 95%   BMI 21.78 kg/m   BP Readings from Last 3 Encounters:  10/06/23 132/72  06/07/23 (!) 180/80  02/16/23 (!) 162/74    Wt Readings  from Last 3 Encounters:  10/06/23 137 lb (62.1 kg)  06/07/23 139 lb (63 kg)  02/16/23 142 lb (64.4 kg)    Physical Exam Vitals reviewed.  Constitutional:      Appearance: Normal appearance.  HENT:     Nose: Nose normal.     Mouth/Throat:     Mouth: Mucous membranes are moist.  Eyes:     General: No scleral icterus.    Conjunctiva/sclera: Conjunctivae normal.  Cardiovascular:     Rate and Rhythm: Regular rhythm. Bradycardia present.     Heart sounds: No murmur heard.    No friction rub. No gallop.      Comments: EKG- SB with SA, 43 bpm ?LAE Septal infarct pattern Unchanged Pulmonary:     Effort: Pulmonary effort is normal.     Breath sounds: No stridor. No wheezing, rhonchi or rales.  Abdominal:     General: Abdomen is flat.     Palpations: There is no mass.     Tenderness: There is no abdominal tenderness. There is no guarding.     Hernia: No hernia is present.  Musculoskeletal:        General: Normal range of motion.     Cervical back: Neck supple.     Right lower leg: No edema.     Left lower leg: No edema.  Skin:    General: Skin is warm and dry.  Neurological:     General: No focal deficit present.     Mental Status: She is alert. Mental status is at baseline.  Psychiatric:        Mood and Affect: Mood normal.        Behavior: Behavior normal.     Lab Results  Component Value Date   WBC 3.2 (L) 10/06/2023   HGB 12.3 10/06/2023   HCT 37.7 10/06/2023   PLT 251.0 10/06/2023   GLUCOSE 87 10/06/2023   CHOL 177 10/06/2023   TRIG 44.0 10/06/2023   HDL 64.10 10/06/2023   LDLCALC 104 (H) 10/06/2023   ALT 9 10/06/2023   AST 19 10/06/2023   NA 138 10/06/2023   K 3.8 10/06/2023   CL 103 10/06/2023   CREATININE 0.82 10/06/2023   BUN 19 10/06/2023   CO2 29 10/06/2023   TSH 0.99 10/06/2023    CT CARDIAC SCORING ([facility] LOCATIONS ONLY)  Result Date: 09/10/2022 CLINICAL DATA:  A 72 year old African American female with history of chest pain presents for coronary artery calcium scoring. * Tracking Code: FCC * EXAM: CT CARDIAC CORONARY ARTERY CALCIUM SCORE TECHNIQUE: Non-contrast imaging through the heart was performed using prospective ECG gating. Image post processing was performed on an independent workstation, allowing for quantitative analysis of the heart and coronary arteries. Note that this exam targets the heart and the chest was not imaged in its entirety. Radiation dose reduction: This exam was performed according to the departmental dose-optimization program  which includes automated exposure control, adjustment of the mA and/or kV according to patient size and/or use of iterative reconstruction technique. COMPARISON:  None available FINDINGS: CORONARY CALCIUM SCORES: Left Main: 0 LAD: 0 LCx: 0 RCA: 0 Total Agatston Score: 0 MESA database percentile: 0 AORTA MEASUREMENTS: Ascending Aorta: 2.9 cm Descending Aorta:2.4 cm OTHER FINDINGS: Cardiovascular: Mild calcified aortic atherosclerotic changes. Normal heart size. Normal caliber of the thoracic aorta and central pulmonary vessels to the extent evaluated. Mediastinum/Nodes: RIGHT middle lobe nodule (image 48/9) 5 mm. This appears triangular and is seen along the fissure in the RIGHT mid chest.  No effusion. No consolidation. Airways are patent to the extent evaluated. Lungs/Pleura: No adenopathy or acute process in the mediastinum. Upper Abdomen: Incidental imaging of upper abdominal contents without acute process. Musculoskeletal: No acute bone finding. No destructive bone process. Spinal degenerative changes. Degenerative changes are mild. IMPRESSION: 1. Coronary artery calcium score of 0. No calcified coronary artery disease. 2. 5 mm right solid pulmonary nodule. Per Fleischner Society Guidelines, no routine follow-up imaging is recommended. This is more likely a small intrapulmonary lymph node. These guidelines do not apply to immunocompromised patients and patients with cancer. Follow up in patients with significant comorbidities as clinically warranted. For lung cancer screening, adhere to Lung-RADS guidelines. Reference: Radiology. 2017; 284(1):228-43. 3. Aortic atherosclerosis. Aortic Atherosclerosis (ICD10-I70.0). Electronically Signed   By: Donzetta Kohut M.D.   On: 09/10/2022 10:03    Assessment & Plan:   Hyperlipidemia with target LDL less than 130- Will start a statin for CV risk reduction. -     Lipid panel; Future -     TSH; Future -     Hepatic function panel; Future -     Rosuvastatin Calcium;  Take 1 tablet (10 mg total) by mouth daily.  Dispense: 90 tablet; Refill: 1  Essential hypertension - Her BP is well controlled. -     TSH; Future -     Hepatic function panel; Future -     CBC with Differential/Platelet; Future -     Basic metabolic panel; Future -     Urinalysis, Routine w reflex microscopic; Future  Bradycardia, sinus -     TSH; Future -     EKG 12-Lead -     Ambulatory referral to Cardiology  Other dietary vitamin B12 deficiency anemia -     Folate; Future -     CBC with Differential/Platelet; Future -     Vitamin B12; Future  Encounter for general adult medical examination with abnormal findings- Exam completed, labs reviewed, vaccines reviewed, cancer screenings addressed, pt ed material was given.   Vitamin D deficiency -     VITAMIN D 25 Hydroxy (Vit-D Deficiency, Fractures); Future  Screening for colon cancer -     Ambulatory referral to Gastroenterology     Follow-up: Return in about 6 months (around 04/05/2024).  Sanda Linger, MD

## 2023-10-06 NOTE — Patient Instructions (Signed)

## 2023-10-08 MED ORDER — ROSUVASTATIN CALCIUM 10 MG PO TABS: 10.0000 mg | ORAL_TABLET | Freq: Every day | ORAL | 1 refills | Status: AC

## 2023-10-19 ENCOUNTER — Encounter: Payer: Self-pay | Admitting: Cardiology

## 2023-10-19 ENCOUNTER — Ambulatory Visit: Payer: Medicare HMO | Attending: Cardiology | Admitting: Cardiology

## 2023-10-19 ENCOUNTER — Ambulatory Visit (INDEPENDENT_AMBULATORY_CARE_PROVIDER_SITE_OTHER): Payer: Medicare HMO

## 2023-10-19 VITALS — BP 132/52 | HR 50 | Ht 68.0 in | Wt 139.4 lb

## 2023-10-19 DIAGNOSIS — E785 Hyperlipidemia, unspecified: Secondary | ICD-10-CM | POA: Diagnosis not present

## 2023-10-19 DIAGNOSIS — I10 Essential (primary) hypertension: Secondary | ICD-10-CM

## 2023-10-19 DIAGNOSIS — R001 Bradycardia, unspecified: Secondary | ICD-10-CM

## 2023-10-19 NOTE — Patient Instructions (Addendum)
Medication Instructions:  Your physician recommends that you continue on your current medications as directed. Please refer to the Current Medication list given to you today. *If you need a refill on your cardiac medications before your next appointment, please call your pharmacy*   Lab Work: None ordered   Testing/Procedures: ZIO XT- Long Term Monitor Instructions  Your physician has requested you wear a ZIO patch monitor for 7 days.  This is a single patch monitor. [employer] supplies one patch monitor per enrollment. Additional stickers are not available. Please do not apply patch if you will be having a Nuclear Stress Test,  Echocardiogram, Cardiac CT, MRI, or Chest Xray during the period you would be wearing the  monitor. The patch cannot be worn during these tests. You cannot remove and re-apply the  ZIO XT patch monitor.  Your ZIO patch monitor will be mailed 3 day USPS to your address on file. It may take 3-5 days  to receive your monitor after you have been enrolled.  Once you have received your monitor, please review the enclosed instructions. Your monitor  has already been registered assigning a specific monitor serial # to you.  Billing and Patient Assistance Program Information  We have supplied [employer] with any of your insurance information on file for billing purposes. [employer] offers a sliding scale Patient Assistance Program for patients that do not have  insurance, or whose insurance does not completely cover the cost of the ZIO monitor.  You must apply for the Patient Assistance Program to qualify for this discounted rate.  To apply, please call [employer] at [phone], select option 4, select option 2, ask to apply for  Patient Assistance Program. Meredeth Ide will ask your household income, and how many people  are in your household. They will quote your out-of-pocket cost based on that information.  [employer] will also be able to set up a 25-month, interest-free payment  plan if needed.  Applying the monitor   Shave hair from upper left chest.  Hold abrader disc by orange tab. Rub abrader in 40 strokes over the upper left chest as  indicated in your monitor instructions.  Clean area with 4 enclosed alcohol pads. Let dry.  Apply patch as indicated in monitor instructions. Patch will be placed under collarbone on left  side of chest with arrow pointing upward.  Rub patch adhesive wings for 2 minutes. Remove white label marked "1". Remove the white  label marked "2". Rub patch adhesive wings for 2 additional minutes.  While looking in a mirror, press and release button in center of patch. A small green light will  flash 3-4 times. This will be your only indicator that the monitor has been turned on.  Do not shower for the first 24 hours. You may shower after the first 24 hours.  Press the button if you feel a symptom. You will hear a small click. Record Date, Time and  Symptom in the Patient Logbook.  When you are ready to remove the patch, follow instructions on the last 2 pages of Patient  Logbook. Stick patch monitor onto the last page of Patient Logbook.  Place Patient Logbook in the blue and white box. Use locking tab on box and tape box closed  securely. The blue and white box has prepaid postage on it. Please place it in the mailbox as  soon as possible. Your physician should have your test results approximately 7 days after the  monitor has been mailed back to [facility].  Call [facility] Customer Care at [phone] if you have questions regarding  your ZIO XT patch monitor. Call them immediately if you see an orange light blinking on your  monitor.  If your monitor falls off in less than 4 days, contact our Monitor department at [phone].  If your monitor becomes loose or falls off after 4 days call [employer] at [phone] for  suggestions on securing your monitor   Follow-Up: At [facility], you and your health needs  are our priority.  As part of our continuing mission to provide you with exceptional heart care, we have created designated Provider Care Teams.  These Care Teams include your primary Cardiologist (physician) and Advanced Practice Providers (APPs -  Physician Assistants and Nurse Practitioners) who all work together to provide you with the care you need, when you need it.  We recommend signing up for the patient portal called "MyChart".  Sign up information is provided on this After Visit Summary.  MyChart is used to connect with patients for Virtual Visits (Telemedicine).  Patients are able to view lab/test results, encounter notes, upcoming appointments, etc.  Non-urgent messages can be sent to your provider as well.   To learn more about what you can do with MyChart, go to [email or web address].    Your next appointment:   6 month(s)  Provider:   Perlie Gold, PA-C       Other Instructions

## 2023-10-19 NOTE — Progress Notes (Signed)
Cardiology Office Note:   Date:  10/19/2023  ID:  Susan Mccarty, DOB 1951/05/06, MRN [id number] PCP: Etta Grandchild, MD  [facility] Providers Cardiologist:  None    History of Present Illness:   Discussed the use of AI scribe software for clinical note transcription with the patient, who gave verbal consent to proceed.  History of Present Illness   A 72 year old patient with a history of sinus bradycardia, hypertension, and hyperlipidemia presents for evaluation of a low heart rate. The patient's bradycardia was first noted in 2018, at which time she was asymptomatic.  An echocardiogram in 2018 showed a normal left ventricular ejection fraction. The bradycardia was initially thought to be secondary to Timolol used for glaucoma and this was discontinued. Patient had only a mild increase in HR after stopping but has remained stable/asymptomatic since her initial evaluation. The patient wore a heart monitor for 14 days last year, which showed an average heart rate of 56 beats per minute.  The patient's recent EKG at her primary care physician's office showed a heart rate of 43, prompting this visit today. The patient reports feeling fine overall, with occasional tiredness, particularly after a day of work. She denies regular dizziness or lightheadedness, but notes occasional episodes when moving quickly at work, occurring approximately a couple of times a month. The frequency of these episodes has not increased over the past year. The patient denies chest pain but reports occasional shortness of breath with exertion in hot weather. She says this has improved with cooler weather. She maintains an active lifestyle, including daily walks and regular exercise three times a week, with no change in exercise tolerance.  The patient's labs, including thyroid panel, cholesterol, liver function, and blood count, are all within normal limits.      ROS summary: Shortness of breath with hot weather and  rare dizziness on standing. Patient otherwise denies chest pain, lower extremity edema, fatigue, palpitations, melena, hematuria, hemoptysis, diaphoresis, weakness, presyncope, syncope, orthopnea, and PND.   Studies Reviewed:    EKG:   EKG Interpretation Date/Time:  Tuesday October 19 2023 09:48:30 EDT Ventricular Rate:  49 PR Interval:  158 QRS Duration:  80 QT Interval:  468 QTC Calculation: 422 R Axis:   83  Text Interpretation: Sinus bradycardia with sinus arrhythmia No previous ECGs available Confirmed by Perlie Gold [phone]) on 10/19/2023 10:01:53 AM    11/11/22 Zio     Patch wear time was 13 days and 20 hours   Predominant rhythm was SR with average HR 56bpm   12 runs of SVT however true duration of episodes difficult to quantify due to baseline artifact   Rare SVE (<1%), rare VE (<1%)   No Afib or significant pauses    Risk Assessment/Calculations:              Physical Exam:   VS:  BP (!) 132/52   Pulse (!) 50   Ht 5\' 8"  (1.727 m)   Wt 139 lb 6.4 oz (63.2 kg)   SpO2 99%   BMI 21.20 kg/m    Wt Readings from Last 3 Encounters:  10/19/23 139 lb 6.4 oz (63.2 kg)  10/06/23 137 lb (62.1 kg)  06/07/23 139 lb (63 kg)     Physical Exam Vitals reviewed.  Constitutional:      Appearance: Normal appearance.  HENT:     Head: Normocephalic.     Nose: Nose normal.  Eyes:     Pupils: Pupils are equal, round, and  reactive to light.  Cardiovascular:     Rate and Rhythm: Regular rhythm. Bradycardia present.     Pulses: Normal pulses.     Heart sounds: Normal heart sounds. No murmur heard.    No friction rub. No gallop.  Pulmonary:     Effort: Pulmonary effort is normal.     Breath sounds: Normal breath sounds.  Abdominal:     General: Abdomen is flat.  Musculoskeletal:     Right lower leg: No edema.     Left lower leg: No edema.  Skin:    General: Skin is warm and dry.     Capillary Refill: Capillary refill takes less than 2 seconds.  Neurological:      General: No focal deficit present.     Mental Status: She is alert and oriented to person, place, and time.  Psychiatric:        Mood and Affect: Mood normal.        Behavior: Behavior normal.        Thought Content: Thought content normal.        Judgment: Judgment normal.      ASSESSMENT AND PLAN:     Assessment and Plan    Sinus Bradycardia Long-standing history of asymptomatic bradycardia, previously thought to be secondary to Timolol eye drops for glaucoma. Recent EKG showed heart rate of 43, stable compared to previous tracings. EKG today with HR 49, no evidence of heart block. No significant symptoms of dizziness, fatigue, or exercise intolerance. No change from previous exam. -Order 7-day heart monitor to ensure no significant change in average heart rate (previously 56) or underlying arrhythmia. --No indication for PPM placement at this time but discussed potential future need for pacemaker if symptoms develop or heart rate significantly decreases.  Hypertension  BP stable at 132/52. -Given bradycardia, will tolerate slightly higher resting BP. Continue hydrochlorothiazide 12.5mg .   Hyperlipidemia LDL well controlled at 104.  -Continue dietary management.  Glaucoma Followed by ophthalmology. Continue to avoid beta blocking agents.          Signed, Perlie Gold, PA-C

## 2023-10-19 NOTE — Progress Notes (Unsigned)
Applied a 7 day Zio XT monitor to patient in the office  [place] to read

## 2023-11-01 DIAGNOSIS — R001 Bradycardia, unspecified: Secondary | ICD-10-CM | POA: Diagnosis not present

## 2023-12-09 ENCOUNTER — Other Ambulatory Visit: Payer: Self-pay | Admitting: Internal Medicine

## 2024-02-03 ENCOUNTER — Ambulatory Visit: Payer: Medicare HMO

## 2024-02-03 VITALS — BP 162/62 | HR 57 | Ht 65.0 in | Wt 140.4 lb

## 2024-02-03 DIAGNOSIS — Z0000 Encounter for general adult medical examination without abnormal findings: Secondary | ICD-10-CM | POA: Diagnosis not present

## 2024-02-03 DIAGNOSIS — Z1231 Encounter for screening mammogram for malignant neoplasm of breast: Secondary | ICD-10-CM

## 2024-02-03 NOTE — Patient Instructions (Addendum)
Ms. Smithers , Thank you for taking time to come for your Medicare Wellness Visit. I appreciate your ongoing commitment to your health goals. Please review the following plan we discussed and let me know if I can assist you in the future.   Referrals/Orders/Follow-Ups/Clinician Recommendations: Aim for 30 minutes of exercise or brisk walking, 6-8 glasses of water, and 5 servings of fruits and vegetables each day.   This is a list of the screening recommended for you and due dates:  Health Maintenance  Topic Date Due   Colon Cancer Screening  10/22/2018   COVID-19 Vaccine (4 - 2024-25 season) 08/22/2023   Mammogram  12/25/2023   Flu Shot  03/20/2024*   Pneumonia Vaccine (1 of 1 - PCV) 10/05/2024*   Medicare Annual Wellness Visit  02/02/2025   DTaP/Tdap/Td vaccine (3 - Td or Tdap) 08/06/2030   DEXA scan (bone density measurement)  Completed   Hepatitis C Screening  Completed   HPV Vaccine  Aged Out   Zoster (Shingles) Vaccine  Discontinued  *Topic was postponed. The date shown is not the original due date.    Advanced directives: (Provided) Advance directive discussed with you today. I have provided a copy for you to complete at home and have notarized. Once this is complete, please bring a copy in to our office so we can scan it into your chart.   Next Medicare Annual Wellness Visit scheduled for next year: Yes - 02/06/2025

## 2024-02-03 NOTE — Progress Notes (Addendum)
 Subjective:   Susan Mccarty is a 73 y.o. female who presents for Medicare Annual (Subsequent) preventive examination.  Visit Complete: In person   Cardiac Risk Factors include: advanced age (>71men, >72 women);hypertension;dyslipidemia     Objective:    Today's Vitals   02/03/24 1314 02/03/24 1329  BP: (!) 160/62 (!) 162/62  Pulse: (!) 57   SpO2: 99%   Weight: 140 lb 6.4 oz (63.7 kg)   Height: 5\' 5"  (1.651 m)    Body mass index is 23.36 kg/m.     02/03/2024    1:13 PM 02/16/2023    9:20 AM 09/07/2018   11:20 AM 09/30/2015    9:37 AM  Advanced Directives  Does Patient Have a Medical Advance Directive? No Yes No No  Type of Special educational needs teacher of [place];Living will    Copy of Healthcare Power of Attorney in Chart?  No - copy requested    Would patient like information on creating a medical advance directive? Yes (MAU/Ambulatory/Procedural Areas - Information given)  Yes (ED - Information included in AVS)     Current Medications (verified) Outpatient Encounter Medications as of 02/03/2024  Medication Sig   alendronate (FOSAMAX) 70 MG tablet Take 1 tablet (70 mg total) by mouth every 7 (seven) days. Take with a full glass of water on an empty stomach.   brimonidine (ALPHAGAN) 0.2 % ophthalmic solution 1 drop 2 (two) times daily.   cyanocobalamin 2000 MCG tablet Take 1 tablet (2,000 mcg total) by mouth daily.   dorzolamide (TRUSOPT) 2 % ophthalmic solution Place 1 drop into both eyes 2 (two) times daily.   hydrochlorothiazide (MICROZIDE) 12.5 MG capsule TAKE 1 CAPSULE BY MOUTH EVERY DAY   Iron-Vitamins (GERITOL) LIQD Take by mouth daily.   Netarsudil-Latanoprost (ROCKLATAN) 0.02-0.005 % SOLN Apply 1 drop to eye daily at 6 (six) AM.   rosuvastatin (CRESTOR) 10 MG tablet Take 1 tablet (10 mg total) by mouth daily.   vitamin C (ASCORBIC ACID) 500 MG tablet Take 500 mg by mouth daily.   VITAMIN D PO Take 5,000 Int'l Units by mouth daily.   No  facility-administered encounter medications on file as of 02/03/2024.    Allergies (verified) Patient has no known allergies.   History: Past Medical History:  Diagnosis Date   Bradycardia 08/17/2017   Bradycardia, sinus 06/02/2016   Deficiency anemia 08/17/2017   Essential hypertension 06/02/2016   Fibroid, uterine 09/17/2016   Glaucoma    Hyperlipidemia with target LDL less than 130 05/17/2014   F[place]ingham risk score is 4%   Hypokalemia 05/11/2017   Iron deficiency anemia secondary to inadequate dietary iron intake 08/19/2017   Loss of weight 09/29/2016   Osteoporosis 01/2018   T score -2.5   Osteoporosis    Other dietary vitamin B12 deficiency anemia 08/19/2017   Postmenopausal postcoital bleeding 09/29/2016   Routine health maintenance 09/26/2011   Visit for screening mammogram 05/27/2015   Vitamin D deficiency    Weight loss, non-intentional 09/17/2016   Past Surgical History:  Procedure Laterality Date   BREAST CYST EXCISION  2006   right    Family History  Problem Relation Age of Onset   Diabetes Mother    Alcohol abuse Mother    Hypertension Father    Diabetes Sister    Hypertension Sister    Diabetes Maternal Grandmother    Heart attack Maternal Grandmother    Social History   Socioeconomic History   Marital status: Single    Spouse  name: Not on file   Number of children: 0   Years of education: 65   Highest education level: Not on file  Occupational History   Occupation: SEAMSTRESS    Employer: [employer]  Tobacco Use   Smoking status: Never    Passive exposure: Never   Smokeless tobacco: Never  Vaping Use   Vaping status: Never Used  Substance and Sexual Activity   Alcohol use: Yes    Alcohol/week: 1.0 standard drink of alcohol    Types: 1 Standard drinks or equivalent per week    Comment: occ   Drug use: No   Sexual activity: Yes    Partners: Male    Birth control/protection: Post-menopausal    Comment: 1st intercourse 73  yo-Fewer than 5 partners  Other Topics Concern   Not on file  Social History Narrative   HSG, no college. Single - in a relationship. No children. Work - Neurosurgeon. Exercises daily. No history of abuse.   Social Drivers of Corporate investment banker Strain: Low Risk  (02/03/2024)   Overall Financial Resource Strain (CARDIA)    Difficulty of Paying Living Expenses: Not hard at all  Food Insecurity: No Food Insecurity (02/03/2024)   Hunger Vital Sign    Worried About Running Out of Food in the Last Year: Never true    Ran Out of Food in the Last Year: Never true  Transportation Needs: No Transportation Needs (02/03/2024)   PRAPARE - Administrator, Civil Service (Medical): No    Lack of Transportation (Non-Medical): No  Physical Activity: Sufficiently Active (02/03/2024)   Exercise Vital Sign    Days of Exercise per Week: 7 days    Minutes of Exercise per Session: 30 min  Stress: No Stress Concern Present (02/03/2024)   [employer] of Occupational Health - Occupational Stress Questionnaire    Feeling of Stress : Not at all  Social Connections: Moderately Integrated (02/03/2024)   Social Connection and Isolation Panel [NHANES]    Frequency of Communication with Friends and Family: More than three times a week    Frequency of Social Gatherings with Friends and Family: More than three times a week    Attends Religious Services: More than 4 times per year    Active Member of [employer] or Organizations: Yes    Attends Engineer, structural: More than 4 times per year    Marital Status: Never married    Tobacco Counseling Counseling given: Not Answered   Clinical Intake:  Pre-visit preparation completed: Yes  Pain : No/denies pain     BMI - recorded: 23.36 Nutritional Risks: None Diabetes: No  How often do you need to have someone help you when you read instructions, pamphlets, or other written materials from your doctor or pharmacy?: 1 -  Never  Interpreter Needed?: No  Information entered by :: Hassell Halim, CMA   Activities of Daily Living    02/03/2024    1:19 PM 02/16/2023    9:24 AM  In your present state of health, do you have any difficulty performing the following activities:  Hearing? 0 0  Vision? 0 0  Difficulty concentrating or making decisions? 0 1  Comment  keeps notes  Walking or climbing stairs? 0 0  Dressing or bathing? 0 0  Doing errands, shopping? 0 0  Preparing Food and eating ? N N  Using the Toilet? N N  In the past six months, have you accidently leaked urine? [place]  Comment  no protection  Do you have problems with loss of bowel control? N N  Managing your Medications? N N  Managing your Finances? N N  Housekeeping or managing your Housekeeping? N N    Patient Care Team: Etta Grandchild, MD as PCP - General (Internal Medicine) Althea Charon, MD (Radiology)  Indicate any recent Medical Services you may have received from other than [facility] providers in the past year (date may be approximate).     Assessment:   This is a routine wellness examination for Myeasha.  Hearing/Vision screen Hearing Screening - Comments:: Denies hearing difficulties   Vision Screening - Comments:: Wears rx glasses - up to date with routine eye exams with  Dr Dione Booze   Goals Addressed               This Visit's Progress     Patient Stated (pt-stated)        Patient plans to get a better routine with exercising.         Depression Screen    02/03/2024    1:24 PM 06/07/2023   10:12 AM 02/16/2023    9:24 AM 06/10/2022    8:36 AM 05/07/2021    8:15 AM 01/16/2020    9:55 AM 09/29/2018    4:09 PM  PHQ 2/9 Scores  PHQ - 2 Score 0 0 0 1 0 0 0  PHQ- 9 Score  0  2 0      Fall Risk    02/03/2024    1:19 PM 06/07/2023   10:12 AM 02/16/2023    9:21 AM 06/10/2022    8:36 AM 05/07/2021    8:15 AM  Fall Risk   Falls in the past year? 0 0 0 1 0  Number falls in past yr: 0 0 0 1   Injury with Fall? 0  0 0 0   Risk for fall due to : No Fall Risks  No Fall Risks    Follow up Falls prevention discussed;Falls evaluation completed Falls evaluation completed Falls prevention discussed      MEDICARE RISK AT HOME: Medicare Risk at Home Any stairs in or around the home?: No If so, are there any without handrails?: No Home free of loose throw rugs in walkways, pet beds, electrical cords, etc?: Yes Adequate lighting in your home to reduce risk of falls?: Yes Life alert?: No Use of a cane, walker or w/c?: No Grab bars in the bathroom?: No Shower chair or bench in shower?: No Elevated toilet seat or a handicapped toilet?: Yes  TIMED UP AND GO:  Was the test performed?  No    Cognitive Function:    09/07/2018   11:31 AM  MMSE - Mini Mental State Exam  Orientation to time 5  Orientation to Place 5  Registration 3  Attention/ Calculation 5  Recall 3  Language- name 2 objects 2  Language- repeat 1  Language- follow 3 step command 3  Language- read & follow direction 1  Write a sentence 1  Copy design 1  Total score 30        02/03/2024    1:20 PM 02/16/2023    9:24 AM  6CIT Screen  What Year? 0 points 0 points  What month? 0 points 0 points  What time? 0 points 0 points  Count back from 20 0 points 0 points  Months in reverse 0 points 0 points  Repeat phrase 0 points 0 points  Total Score 0  points 0 points    Immunizations Immunization History  Administered Date(s) Administered   PFIZER(Purple Top)SARS-COV-2 Vaccination 04/29/2020, 05/20/2020, 11/26/2020   Tdap 09/04/2010, 08/06/2020    TDAP status: Due, Education has been provided regarding the importance of this vaccine. Advised may receive this vaccine at local pharmacy or Health Dept. Aware to provide a copy of the vaccination record if obtained from local pharmacy or Health Dept. Verbalized acceptance and understanding.  Flu Vaccine status: Declined, Education has been provided regarding the importance of this  vaccine but patient still declined. Advised may receive this vaccine at local pharmacy or Health Dept. Aware to provide a copy of the vaccination record if obtained from local pharmacy or Health Dept. Verbalized acceptance and understanding.  Pneumococcal vaccine status: Declined,  Education has been provided regarding the importance of this vaccine but patient still declined. Advised may receive this vaccine at local pharmacy or Health Dept. Aware to provide a copy of the vaccination record if obtained from local pharmacy or Health Dept. Verbalized acceptance and understanding.   Covid-19 vaccine status: Declined, Education has been provided regarding the importance of this vaccine but patient still declined. Advised may receive this vaccine at local pharmacy or Health Dept.or vaccine clinic. Aware to provide a copy of the vaccination record if obtained from local pharmacy or Health Dept. Verbalized acceptance and understanding.  Qualifies for Shingles Vaccine? Yes   Zostavax completed No   Shingrix Completed?: No.    Education has been provided regarding the importance of this vaccine. Patient has been advised to call insurance company to determine out of pocket expense if they have not yet received this vaccine. Advised may also receive vaccine at local pharmacy or Health Dept. Verbalized acceptance and understanding.  Screening Tests Health Maintenance  Topic Date Due   Colonoscopy  10/22/2018   COVID-19 Vaccine (4 - 2024-25 season) 08/22/2023   MAMMOGRAM  12/25/2023   INFLUENZA VACCINE  03/20/2024 (Originally 07/22/2023)   Pneumonia Vaccine 29+ Years old (1 of 1 - PCV) 10/05/2024 (Originally 09/01/2016)   Medicare Annual Wellness (AWV)  02/02/2025   DTaP/Tdap/Td (3 - Td or Tdap) 08/06/2030   DEXA SCAN  Completed   Hepatitis C Screening  Completed   HPV VACCINES  Aged Out   Zoster Vaccines- Shingrix  Discontinued    Health Maintenance  Health Maintenance Due  Topic Date Due    Colonoscopy  10/22/2018   COVID-19 Vaccine (4 - 2024-25 season) 08/22/2023   MAMMOGRAM  12/25/2023    Colorectal cancer screening: Type of screening: Colonoscopy. Completed 10/23/2015. Repeat every 3 years  Mammogram status: Ordered 02/03/2024. Pt provided with contact info and advised to call to schedule appt.   Bone Density status: Completed 08/04/2022. Results reflect: Bone density results: OSTEOPENIA. Repeat every 3-5 years. Pt's Gynecologist schedules the DEXA  and is planned for 2025.  Additional Screening:  Hepatitis C Screening: does qualify; Completed 09/28/2016  Vision Screening: Recommended annual ophthalmology exams for early detection of glaucoma and other disorders of the eye. Is the patient up to date with their annual eye exam?  Yes  Who is the provider or what is the name of the office in which the patient attends annual eye exams? Dr Marchelle Gearing If pt is not established with a provider, would they like to be referred to a provider to establish care? No .   Dental Screening: Recommended annual dental exams for proper oral hygiene   Community Resource Referral / Chronic Care Management: CRR required this visit?  No   CCM required this visit?  No     Plan:     I have personally reviewed and noted the following in the patient's chart:   Medical and social history Use of alcohol, tobacco or illicit drugs  Current medications and supplements including opioid prescriptions. Patient is not currently taking opioid prescriptions. Functional ability and status Nutritional status Physical activity Advanced directives List of other physicians Hospitalizations, surgeries, and ER visits in previous 12 months Vitals Screenings to include cognitive, depression, and falls Referrals and appointments  In addition, I have reviewed and discussed with patient certain preventive protocols, quality metrics, and best practice recommendations. A written personalized care plan for  preventive services as well as general preventive health recommendations were provided to patient.     Darreld Mclean, CMA   02/03/2024   After Visit Summary: (In Person-Printed) AVS printed and given to the patient  Nurse Notes: ordered Mammogram for 2025.  Will schedule an appt for the pt to see PCP or other Provider in the office to f/u on elevated BP.

## 2024-02-04 ENCOUNTER — Encounter: Payer: Self-pay | Admitting: Family Medicine

## 2024-02-04 ENCOUNTER — Ambulatory Visit (INDEPENDENT_AMBULATORY_CARE_PROVIDER_SITE_OTHER): Payer: Medicare HMO | Admitting: Family Medicine

## 2024-02-04 VITALS — BP 170/68 | HR 55 | Temp 98.3°F | Ht 65.0 in | Wt 141.2 lb

## 2024-02-04 DIAGNOSIS — I10 Essential (primary) hypertension: Secondary | ICD-10-CM

## 2024-02-04 MED ORDER — HYDROCHLOROTHIAZIDE 12.5 MG PO CAPS: 25.0000 mg | ORAL_CAPSULE | Freq: Every day | ORAL | 0 refills | Status: DC

## 2024-02-04 NOTE — Patient Instructions (Signed)
INCREASE hydrochlorothiazide to 25mg  once daily in the morning.  Continue to check BPs at home.  Follow up with [place]  in a month to recheck BP and meds, sooner if needed

## 2024-02-04 NOTE — Progress Notes (Signed)
   Established Patient Office Visit  Subjective   Patient ID: Susan Mccarty, female    DOB: 03-16-1951  Age: 73 y.o. MRN: [id number]  Chief Complaint  Patient presents with   Hypertension    Home readings are running 150's over low 60's. Noticed about a month ago, has headaches at night    HPI Patient presents today for evaluation of elevated blood pressure. Reports headaches recently at night. Reports compliance with medication regimen. Denies the need for refills. Not fasting today. Denies other concerns. Medical history as outlined below.  ROS Per HPI    Objective:     BP (!) 170/68 (BP Location: Left Arm, Patient Position: Sitting, Cuff Size: Normal)   Pulse (!) 55   Temp 98.3 F (36.8 C) (Oral)   Ht 5\' 5"  (1.651 m)   Wt 141 lb 3.2 oz (64 kg)   SpO2 98%   BMI 23.50 kg/m   Physical Exam Vitals and nursing note reviewed.  Constitutional:      General: She is not in acute distress.    Appearance: Normal appearance. She is normal weight.  HENT:     Head: Normocephalic and atraumatic.  Eyes:     Extraocular Movements: Extraocular movements intact.     Pupils: Pupils are equal, round, and reactive to light.  Cardiovascular:     Rate and Rhythm: Normal rate and regular rhythm.     Heart sounds: Normal heart sounds.  Pulmonary:     Effort: Pulmonary effort is normal. No respiratory distress.     Breath sounds: Normal breath sounds. No wheezing, rhonchi or rales.  Musculoskeletal:        General: Normal range of motion.     Cervical back: Normal range of motion.     Right lower leg: No edema.     Left lower leg: No edema.  Neurological:     General: No focal deficit present.     Mental Status: She is alert and oriented to person, place, and time.  Psychiatric:        Mood and Affect: Mood normal.        Thought Content: Thought content normal.     No results found for any visits on 02/04/24.   The 10-year ASCVD risk score (Arnett DK, et al., 2019) is:  19.5%    Assessment & Plan:   Essential hypertension -     hydroCHLOROthiazide; Take 2 capsules (25 mg total) by mouth daily.  Dispense: 90 capsule; Refill: 0  Increase hydrochlorothiazide to 25mg  once daily.  F/u in one month for re eval  Return in about 4 weeks (around 03/03/2024) for BP, meds.    Moshe Cipro, FNP

## 2024-03-13 ENCOUNTER — Encounter: Payer: Self-pay | Admitting: Internal Medicine

## 2024-03-15 DIAGNOSIS — H401123 Primary open-angle glaucoma, left eye, severe stage: Secondary | ICD-10-CM | POA: Diagnosis not present

## 2024-03-15 DIAGNOSIS — H401112 Primary open-angle glaucoma, right eye, moderate stage: Secondary | ICD-10-CM | POA: Diagnosis not present

## 2024-03-15 DIAGNOSIS — H25813 Combined forms of age-related cataract, bilateral: Secondary | ICD-10-CM | POA: Diagnosis not present

## 2024-03-18 ENCOUNTER — Other Ambulatory Visit: Payer: Self-pay | Admitting: Internal Medicine

## 2024-03-18 DIAGNOSIS — I10 Essential (primary) hypertension: Secondary | ICD-10-CM

## 2024-03-27 ENCOUNTER — Other Ambulatory Visit: Payer: Self-pay | Admitting: Family Medicine

## 2024-03-27 DIAGNOSIS — I10 Essential (primary) hypertension: Secondary | ICD-10-CM

## 2024-04-18 ENCOUNTER — Encounter

## 2024-05-08 ENCOUNTER — Encounter: Admitting: Internal Medicine

## 2024-05-23 ENCOUNTER — Telehealth: Payer: Self-pay | Admitting: *Deleted

## 2024-05-23 ENCOUNTER — Encounter: Payer: Self-pay | Admitting: Internal Medicine

## 2024-05-23 ENCOUNTER — Ambulatory Visit (AMBULATORY_SURGERY_CENTER): Admitting: *Deleted

## 2024-05-23 VITALS — Ht 67.0 in | Wt 140.0 lb

## 2024-05-23 DIAGNOSIS — Z860100 Personal history of colon polyps, unspecified: Secondary | ICD-10-CM

## 2024-05-23 MED ORDER — NA SULFATE-K SULFATE-MG SULF 17.5-3.13-1.6 GM/177ML PO SOLN: 1.0000 | Freq: Once | ORAL | 0 refills | Status: AC

## 2024-05-23 NOTE — Telephone Encounter (Signed)
 Attempt to reach pt for pre-visit. LM with call back #.   Will attempt other number in profile  Second attempt to reached pt

## 2024-05-23 NOTE — Progress Notes (Signed)
 Pt's name and DOB verified at the beginning of the pre-visit wit 2 identifiers   Pt denies any difficulty with ambulating,sitting, laying down or rolling side to side  Pt has no issues moving head neck or swallowing  No egg or soy allergy known to patient   No issues known to pt with past sedation with any surgeries or procedures  Patient denies ever being intubated  No FH of Malignant Hyperthermia  Pt is not on home 02   Pt is not on blood thinners   Pt denies issues with constipation   Pt is not on dialysis  Pt hx of sinus Bradycardia  Pt denies any upcoming cardiac testing  Patient's chart reviewed by Rogena Class CNRA prior to pre-visit and patient appropriate for the [facility].  Pre-visit completed and red dot placed by patient's name on their procedure day (on provider's schedule).     Visit by phone  Pt states weight is 140 lb  IInstructions reviewed. Pt given  both [facility] main # and MD on call # prior to instructions.  Pt states understanding of instructions. Instructed pt to review instructions again prior to procedure and call main # given if has questions.. Pt states they will.

## 2024-05-28 ENCOUNTER — Encounter: Payer: Self-pay | Admitting: Internal Medicine

## 2024-06-01 ENCOUNTER — Telehealth: Payer: Self-pay | Admitting: Internal Medicine

## 2024-06-01 NOTE — Telephone Encounter (Signed)
 PT is scheduled for a colonoscopy 6/16 and wants to know will she still be able to take blood pressure medications. Please advise.

## 2024-06-01 NOTE — Telephone Encounter (Signed)
 RN made second attempt to call patient regarding her question about whether she should take her BP medications prior to her procedure; phone rang again multiple times with no answer or VM message. RN also tried patient's work phone number, but the number is not working.

## 2024-06-01 NOTE — Telephone Encounter (Signed)
 RN attempted to return patient call regarding whether she is able to take her BP medications prior to her procedure on 6/16. Patient phone rang multiple times with no answer. RN will attempt call later.

## 2024-06-05 ENCOUNTER — Ambulatory Visit: Admitting: Internal Medicine

## 2024-06-05 ENCOUNTER — Encounter: Payer: Self-pay | Admitting: Internal Medicine

## 2024-06-05 VITALS — BP 158/64 | HR 60 | Temp 97.0°F | Resp 15 | Ht 67.0 in | Wt 140.0 lb

## 2024-06-05 DIAGNOSIS — K648 Other hemorrhoids: Secondary | ICD-10-CM | POA: Diagnosis not present

## 2024-06-05 DIAGNOSIS — Z1211 Encounter for screening for malignant neoplasm of colon: Secondary | ICD-10-CM | POA: Diagnosis not present

## 2024-06-05 DIAGNOSIS — K5730 Diverticulosis of large intestine without perforation or abscess without bleeding: Secondary | ICD-10-CM | POA: Diagnosis not present

## 2024-06-05 DIAGNOSIS — Z860100 Personal history of colon polyps, unspecified: Secondary | ICD-10-CM

## 2024-06-05 DIAGNOSIS — I10 Essential (primary) hypertension: Secondary | ICD-10-CM | POA: Diagnosis not present

## 2024-06-05 DIAGNOSIS — D123 Benign neoplasm of transverse colon: Secondary | ICD-10-CM

## 2024-06-05 DIAGNOSIS — K635 Polyp of colon: Secondary | ICD-10-CM

## 2024-06-05 DIAGNOSIS — Z860101 Personal history of adenomatous and serrated colon polyps: Secondary | ICD-10-CM | POA: Diagnosis not present

## 2024-06-05 MED ORDER — SODIUM CHLORIDE 0.9 % IV SOLN: 500.0000 mL | Freq: Once | INTRAVENOUS | Status: DC

## 2024-06-05 NOTE — Progress Notes (Signed)
VS completed by DT.  Pt's states no medical or surgical changes since previsit or office visit.  

## 2024-06-05 NOTE — Op Note (Signed)
 [facility] Patient Name: Susan Mccarty Procedure Date: 06/05/2024 2:14 PM MRN: [id number] Endoscopist: Nannette Babe , MD, [id number] Age: 73 Referring MD:  Date of Birth: [birth year] Gender: Female Account #: [id number] Procedure:                Colonoscopy Indications:              High risk colon cancer surveillance: Personal                            history of multiple adenomas, Last colonoscopy:                            October 2016 (TA x 3) Medicines:                Monitored Anesthesia Care Procedure:                Pre-Anesthesia Assessment:                           - Prior to the procedure, a History and Physical                            was performed, and patient medications and                            allergies were reviewed. The patient's tolerance of                            previous anesthesia was also reviewed. The risks                            and benefits of the procedure and the sedation                            options and risks were discussed with the patient.                            All questions were answered, and informed consent                            was obtained. Prior Anticoagulants: The patient has                            taken no anticoagulant or antiplatelet agents. ASA                            Grade Assessment: II - A patient with mild systemic                            disease. After reviewing the risks and benefits,                            the patient was deemed in satisfactory condition to  undergo the procedure.                           After obtaining informed consent, the colonoscope                            was passed under direct vision. Throughout the                            procedure, the patient's blood pressure, pulse, and                            oxygen saturations were monitored continuously. The                            Olympus Scope SN: [id number] was introduced  through                            the anus and advanced to the cecum, identified by                            appendiceal orifice and ileocecal valve. The                            colonoscopy was performed without difficulty. The                            patient tolerated the procedure well. The quality                            of the bowel preparation was good. The ileocecal                            valve, appendiceal orifice, and rectum were                            photographed. Scope In: 2:36:56 PM Scope Out: 2:50:15 PM Scope Withdrawal Time: 0 hours 8 minutes 41 seconds  Total Procedure Duration: 0 hours 13 minutes 19 seconds  Findings:                 The digital rectal exam was normal.                           Two sessile polyps were found in the transverse                            colon. The polyps were 3 to 4 mm in size. These                            polyps were removed with a cold snare. Resection                            and retrieval were complete.  Multiple medium-mouthed and small-mouthed                            diverticula were found in the sigmoid colon.                           Internal hemorrhoids were found during                            retroflexion. The hemorrhoids were small. Complications:            No immediate complications. Estimated Blood Loss:     Estimated blood loss: none. Impression:               - Two 3 to 4 mm polyps in the transverse colon,                            removed with a cold snare. Resected and retrieved.                           - Mild diverticulosis in the sigmoid colon.                           - Small internal hemorrhoids. Recommendation:           - Patient has a contact number available for                            emergencies. The signs and symptoms of potential                            delayed complications were discussed with the                            patient. Return  to normal activities tomorrow.                            Written discharge instructions were provided to the                            patient.                           - Resume previous diet.                           - Continue present medications.                           - Await pathology results.                           - Repeat colonoscopy may be recommended for                            surveillance. The colonoscopy interval (versus  discontinuation of surveillance based on age) will                            be determined after pathology results from today's                            exam become available for review. Nannette Babe, MD 06/05/2024 2:53:05 PM This report has been signed electronically.

## 2024-06-05 NOTE — Progress Notes (Signed)
 Report given to PACU, vss

## 2024-06-05 NOTE — Patient Instructions (Addendum)

## 2024-06-05 NOTE — Progress Notes (Signed)
 Called to room to assist during endoscopic procedure.  Patient ID and intended procedure confirmed with present staff. Received instructions for my participation in the procedure from the performing physician.

## 2024-06-05 NOTE — Progress Notes (Signed)
 GASTROENTEROLOGY PROCEDURE H&P NOTE   Primary Care Physician: Arcadio Knuckles, MD    Reason for Procedure:  History of adenomatous colon polyps  Plan:    Surveillance colonoscopy  Patient is appropriate for endoscopic procedure(s) in the ambulatory ([facility]) setting.  The nature of the procedure, as well as the risks, benefits, and alternatives were carefully and thoroughly reviewed with the patient. Ample time for discussion and questions allowed. The patient understood, was satisfied, and agreed to proceed.     HPI: Susan Mccarty is a 73 y.o. female who presents for surveillance colonoscopy.  Medical history as below.  Tolerated the prep.  No recent chest pain or shortness of breath.  No abdominal pain today.  Past Medical History:  Diagnosis Date   Bradycardia 08/17/2017   Bradycardia, sinus 06/02/2016   Deficiency anemia 08/17/2017   Essential hypertension 06/02/2016   Fibroid, uterine 09/17/2016   Glaucoma    Hyperlipidemia with target LDL less than 130 05/17/2014   Framingham risk score is 4%   Hypokalemia 05/11/2017   Iron deficiency anemia secondary to inadequate dietary iron intake 08/19/2017   Loss of weight 09/29/2016   Osteoporosis 01/2018   T score -2.5   Other dietary vitamin B12 deficiency anemia 08/19/2017   Postmenopausal postcoital bleeding 09/29/2016   Routine health maintenance 09/26/2011   Visit for screening mammogram 05/27/2015   Vitamin D  deficiency    Weight loss, non-intentional 09/17/2016    Past Surgical History:  Procedure Laterality Date   BREAST CYST EXCISION  12/21/2004   right    COLONOSCOPY      Prior to Admission medications   Medication Sig Start Date End Date Taking? Authorizing Provider  brimonidine (ALPHAGAN) 0.2 % ophthalmic solution 1 drop 2 (two) times daily. 07/29/21  Yes [provider]  Cyanocobalamin  (VITAMIN B 12 PO) Take 500 mg by mouth.   Yes [provider]  cyanocobalamin  2000 MCG tablet Take 1  tablet (2,000 mcg total) by mouth daily. 08/20/17  Yes Arcadio Knuckles, MD  dorzolamide (TRUSOPT) 2 % ophthalmic solution Place 1 drop into both eyes 2 (two) times daily.   Yes [provider]  hydrochlorothiazide  (MICROZIDE ) 12.5 MG capsule TAKE 2 CAPSULES BY MOUTH DAILY. 03/27/24  Yes Arcadio Knuckles, MD  Iron-Vitamins (GERITOL) LIQD Take by mouth daily.   Yes [provider]  Netarsudil-Latanoprost (ROCKLATAN) 0.02-0.005 % SOLN Apply 1 drop to eye daily at 6 (six) AM.   Yes [provider]  rosuvastatin  (CRESTOR ) 10 MG tablet Take 1 tablet (10 mg total) by mouth daily. 10/08/23  Yes Arcadio Knuckles, MD  vitamin C (ASCORBIC ACID) 500 MG tablet Take 500 mg by mouth daily.   Yes [provider]  VITAMIN D  PO Take 5,000 Int'l Units by mouth daily.   Yes [provider]  alendronate  (FOSAMAX ) 70 MG tablet Take 1 tablet (70 mg total) by mouth every 7 (seven) days. Take with a full glass of water on an empty stomach. Patient not taking: Reported on 06/05/2024 10/06/22   Lavoie, Marie-Lyne, MD    Current Outpatient Medications  Medication Sig Dispense Refill   brimonidine (ALPHAGAN) 0.2 % ophthalmic solution 1 drop 2 (two) times daily.     Cyanocobalamin  (VITAMIN B 12 PO) Take 500 mg by mouth.     cyanocobalamin  2000 MCG tablet Take 1 tablet (2,000 mcg total) by mouth daily. 90 tablet 3   dorzolamide (TRUSOPT) 2 % ophthalmic solution Place 1 drop into both eyes  2 (two) times daily.     hydrochlorothiazide  (MICROZIDE ) 12.5 MG capsule TAKE 2 CAPSULES BY MOUTH DAILY. 90 capsule 1   Iron-Vitamins (GERITOL) LIQD Take by mouth daily.     Netarsudil-Latanoprost (ROCKLATAN) 0.02-0.005 % SOLN Apply 1 drop to eye daily at 6 (six) AM.     rosuvastatin  (CRESTOR ) 10 MG tablet Take 1 tablet (10 mg total) by mouth daily. 90 tablet 1   vitamin C (ASCORBIC ACID) 500 MG tablet Take 500 mg by mouth daily.     VITAMIN D  PO Take 5,000 Int'l Units by mouth daily.     alendronate   (FOSAMAX ) 70 MG tablet Take 1 tablet (70 mg total) by mouth every 7 (seven) days. Take with a full glass of water on an empty stomach. (Patient not taking: Reported on 06/05/2024) 12 tablet 4   Current Facility-Administered Medications  Medication Dose Route Frequency Provider Last Rate Last Admin   0.9 %  sodium chloride  infusion  500 mL Intravenous Once Jes Costales, Amber Bail, MD        Allergies as of 06/05/2024   (No Known Allergies)    Family History  Problem Relation Age of Onset   Diabetes Mother    Alcohol abuse Mother    Hypertension Father    Diabetes Sister    Hypertension Sister    Diabetes Maternal Grandmother    Heart attack Maternal Grandmother    Colon cancer Neg Hx    Colon polyps Neg Hx    Esophageal cancer Neg Hx    Rectal cancer Neg Hx    Stomach cancer Neg Hx     Social History   Socioeconomic History   Marital status: Single    Spouse name: Not on file   Number of children: 0   Years of education: 12   Highest education level: Not on file  Occupational History   Occupation: SEAMSTRESS    Employer: [employer]  Tobacco Use   Smoking status: Never    Passive exposure: Never   Smokeless tobacco: Never  Vaping Use   Vaping status: Never Used  Substance and Sexual Activity   Alcohol use: Yes    Alcohol/week: 1.0 standard drink of alcohol    Types: 1 Standard drinks or equivalent per week    Comment: occ   Drug use: No   Sexual activity: Yes    Partners: Male    Birth control/protection: Post-menopausal    Comment: 1st intercourse 73 yo-Fewer than 5 partners  Other Topics Concern   Not on file  Social History Narrative   HSG, no college. Single - in a relationship. No children. Work - Neurosurgeon. Exercises daily. No history of abuse.   Social Drivers of Corporate investment banker Strain: Low Risk  (02/03/2024)   Overall Financial Resource Strain (CARDIA)    Difficulty of Paying Living Expenses: Not hard at all  Food Insecurity: No Food  Insecurity (02/03/2024)   Hunger Vital Sign    Worried About Running Out of Food in the Last Year: Never true    Ran Out of Food in the Last Year: Never true  Transportation Needs: No Transportation Needs (02/03/2024)   PRAPARE - Administrator, Civil Service (Medical): No    Lack of Transportation (Non-Medical): No  Physical Activity: Sufficiently Active (02/03/2024)   Exercise Vital Sign    Days of Exercise per Week: 7 days    Minutes of Exercise per Session: 30 min  Stress: No Stress Concern Present (02/03/2024)  [employer] of Occupational Health - Occupational Stress Questionnaire    Feeling of Stress : Not at all  Social Connections: Moderately Integrated (02/03/2024)   Social Connection and Isolation Panel    Frequency of Communication with Friends and Family: More than three times a week    Frequency of Social Gatherings with Friends and Family: More than three times a week    Attends Religious Services: More than 4 times per year    Active Member of [employer] or Organizations: Yes    Attends Banker Meetings: More than 4 times per year    Marital Status: Never married  Intimate Partner Violence: Not At Risk (02/03/2024)   Humiliation, Afraid, Rape, and Kick questionnaire    Fear of Current or Ex-Partner: No    Emotionally Abused: No    Physically Abused: No    Sexually Abused: No    Physical Exam: Vital signs in last 24 hours: @BP  (!) 142/61   Pulse (!) 55   Temp (!) 97 F (36.1 C) (Temporal)   Ht 5' 7 (1.702 m)   Wt 140 lb (63.5 kg)   SpO2 100%   BMI 21.93 kg/m  GEN: NAD EYE: Sclerae anicteric ENT: MMM CV: Non-tachycardic Pulm: CTA b/l GI: Soft, NT/ND NEURO:  Alert & Oriented x 3   Laurell Pond, MD [facility]  06/05/2024 2:21 PM

## 2024-06-05 NOTE — Progress Notes (Signed)
 1433 BP 194/91, Labetalol given IV, MD update, vss

## 2024-06-06 ENCOUNTER — Telehealth: Payer: Self-pay | Admitting: *Deleted

## 2024-06-06 NOTE — Telephone Encounter (Signed)
  Follow up Call-     06/05/2024    1:20 PM  Call back number  Post procedure Call Back phone  # [phone]  Permission to leave phone message Yes     Patient questions:  Do you have a fever, pain , or abdominal swelling? No. Pain Score  0 *  Have you tolerated food without any problems? Yes.    Have you been able to return to your normal activities? Yes.    Do you have any questions about your discharge instructions: Diet   No. Medications  No. Follow up visit  No.  Do you have questions or concerns about your Care? No.  Actions: * If pain score is 4 or above: No action needed, pain <4.

## 2024-06-08 LAB — SURGICAL PATHOLOGY

## 2024-06-09 ENCOUNTER — Ambulatory Visit: Payer: Self-pay | Admitting: Internal Medicine

## 2024-08-05 ENCOUNTER — Other Ambulatory Visit: Payer: Self-pay | Admitting: Internal Medicine

## 2024-08-05 DIAGNOSIS — I10 Essential (primary) hypertension: Secondary | ICD-10-CM

## 2024-08-22 ENCOUNTER — Encounter: Admitting: Obstetrics and Gynecology

## 2024-10-09 ENCOUNTER — Encounter: Payer: Medicare HMO | Admitting: Internal Medicine

## 2024-10-30 ENCOUNTER — Encounter (HOSPITAL_COMMUNITY): Payer: Self-pay

## 2024-10-30 ENCOUNTER — Ambulatory Visit (HOSPITAL_COMMUNITY)
Admission: EM | Admit: 2024-10-30 | Discharge: 2024-10-30 | Disposition: A | Discharge status: Home / Self Care | Attending: Family Medicine | Admitting: Family Medicine

## 2024-10-30 ENCOUNTER — Ambulatory Visit (INDEPENDENT_AMBULATORY_CARE_PROVIDER_SITE_OTHER)

## 2024-10-30 DIAGNOSIS — M25532 Pain in left wrist: Secondary | ICD-10-CM

## 2024-10-30 MED ORDER — PREDNISONE 20 MG PO TABS: 40.0000 mg | ORAL_TABLET | Freq: Every day | ORAL | 0 refills | Status: AC

## 2024-10-30 NOTE — Discharge Instructions (Addendum)
 By my review there are some arthritis changes on your x-rays.  The radiologist will also read your x-ray, and if their interpretation differs significantly from mine, and the management of your condition would change, we will call you.  Take prednisone 20 mg--2 daily for 5 days; you can take Tylenol/acetaminophen 500 mg with this medication--take 2 Tylenol every 6 hours as needed for pain  Follow-up with your primary care about your blood pressure.  I suspect it was elevated tonight due to your pain.

## 2024-10-30 NOTE — ED Triage Notes (Signed)
 Pt c/o lt wrist pain x2 days. Denies injury. 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 States taking tylenol and wearing a brace. States can't bend her finger.

## 2024-10-30 NOTE — ED Provider Notes (Signed)
 [facility]    CSN: [id number] Arrival date & time: 10/30/24  1926      History   Chief Complaint Chief Complaint  Patient presents with   Wrist Pain    HPI Susan Mccarty is a 73 y.o. female.    Wrist Pain   Here for left wrist pain.  It began bothering her in the last 2 days.  She is having difficulty bending her left thumb. No trauma or fall She does so all day for a living.  No numbness or tingling.  NKDA  Last eGFR was 71    Past Medical History:  Diagnosis Date   Bradycardia 08/17/2017   Bradycardia, sinus 06/02/2016   Deficiency anemia 08/17/2017   Essential hypertension 06/02/2016   Fibroid, uterine 09/17/2016   Glaucoma    Hyperlipidemia with target LDL less than 130 05/17/2014   Framingham risk score is 4%   Hypokalemia 05/11/2017   Iron deficiency anemia secondary to inadequate dietary iron intake 08/19/2017   Loss of weight 09/29/2016   Osteoporosis 01/2018   T score -2.5   Other dietary vitamin B12 deficiency anemia 08/19/2017   Postmenopausal postcoital bleeding 09/29/2016   Routine health maintenance 09/26/2011   Visit for screening mammogram 05/27/2015   Vitamin D  deficiency    Weight loss, non-intentional 09/17/2016    Patient Active Problem List   Diagnosis Date Noted   Screening for colon cancer 10/06/2023   Polyp of colon 11/03/2022   Encounter for general adult medical examination with abnormal findings 03/03/2022   Eczema 09/28/2018   Vitamin D  deficiency 09/28/2018   Other dietary vitamin B12 deficiency anemia 08/19/2017   Iron deficiency anemia secondary to inadequate dietary iron intake 08/19/2017   Osteopenia 09/29/2016   Fibroid, uterine 09/17/2016   Essential hypertension 06/02/2016   Bradycardia, sinus 06/02/2016   Visit for screening mammogram 05/27/2015   Hyperlipidemia with target LDL less than 130 05/17/2014   Glaucoma 09/26/2011    Past Surgical History:  Procedure Laterality Date   BREAST CYST  EXCISION  12/21/2004   right    COLONOSCOPY      OB History     Gravida  1   Para  0   Term      Preterm      AB  1   Living  0      SAB      IAB      Ectopic      Multiple      Live Births               Home Medications    Prior to Admission medications   Medication Sig Start Date End Date Taking? Authorizing Provider  predniSONE (DELTASONE) 20 MG tablet Take 2 tablets (40 mg total) by mouth daily with breakfast for 5 days. 10/30/24 11/04/24 Yes Vonna Sharlet POUR, MD  alendronate  (FOSAMAX ) 70 MG tablet Take 1 tablet (70 mg total) by mouth every 7 (seven) days. Take with a full glass of water on an empty stomach. Patient not taking: Reported on 06/05/2024 10/06/22   Lavoie, Marie-Lyne, MD  brimonidine (ALPHAGAN) 0.2 % ophthalmic solution 1 drop 2 (two) times daily. 07/29/21   [provider]  Cyanocobalamin  (VITAMIN B 12 PO) Take 500 mg by mouth.    [provider]  cyanocobalamin  2000 MCG tablet Take 1 tablet (2,000 mcg total) by mouth daily. 08/20/17   Joshua Debby CROME, MD  dorzolamide (TRUSOPT) 2 % ophthalmic solution Place 1  drop into both eyes 2 (two) times daily.    [provider]  hydrochlorothiazide  (MICROZIDE ) 12.5 MG capsule TAKE 2 CAPSULES BY MOUTH EVERY DAY 08/09/24   Joshua Debby CROME, MD  Iron-Vitamins (GERITOL) LIQD Take by mouth daily.    [provider]  Netarsudil-Latanoprost (ROCKLATAN) 0.02-0.005 % SOLN Apply 1 drop to eye daily at 6 (six) AM.    [provider]  rosuvastatin  (CRESTOR ) 10 MG tablet Take 1 tablet (10 mg total) by mouth daily. 10/08/23   Joshua Debby CROME, MD  vitamin C (ASCORBIC ACID) 500 MG tablet Take 500 mg by mouth daily.    [provider]  VITAMIN D  PO Take 5,000 Int'l Units by mouth daily.    [provider]    Family History Family History  Problem Relation Age of Onset   Diabetes Mother    Alcohol abuse Mother    Hypertension Father    Diabetes Sister     Hypertension Sister    Diabetes Maternal Grandmother    Heart attack Maternal Grandmother    Colon cancer Neg Hx    Colon polyps Neg Hx    Esophageal cancer Neg Hx    Rectal cancer Neg Hx    Stomach cancer Neg Hx     Social History Social History   Tobacco Use   Smoking status: Never    Passive exposure: Never   Smokeless tobacco: Never  Vaping Use   Vaping status: Never Used  Substance Use Topics   Alcohol use: Yes    Alcohol/week: 1.0 standard drink of alcohol    Types: 1 Standard drinks or equivalent per week    Comment: occ   Drug use: No     Allergies   Patient has no known allergies.   Review of Systems Review of Systems   Physical Exam Triage Vital Signs ED Triage Vitals [10/30/24 1948]  Encounter Vitals Group     BP (!) 203/75     Girls Systolic BP Percentile      Girls Diastolic BP Percentile      Boys Systolic BP Percentile      Boys Diastolic BP Percentile      Pulse Rate (!) 55     Resp 18     Temp 97.9 F (36.6 C)     Temp Source Oral     SpO2 100 %     Weight      Height      Head Circumference      Peak Flow      Pain Score      Pain Loc      Pain Education      Exclude from Growth Chart    No data found.  Updated Vital Signs BP (!) 207/76 (BP Location: Right Arm)   Pulse (!) 55   Temp 97.9 F (36.6 C) (Oral)   Resp 18   SpO2 100%   Visual Acuity Right Eye Distance:   Left Eye Distance:   Bilateral Distance:    Right Eye Near:   Left Eye Near:    Bilateral Near:     Physical Exam Vitals reviewed.  Constitutional:      General: She is not in acute distress.    Appearance: She is not toxic-appearing.  Musculoskeletal:     Comments: There is some tenderness around her wrist joint line and her left thumb has limited mobility.  No erythema or ecchymosis.  Skin:    Coloration: Skin is not  jaundiced or pale.  Neurological:     Mental Status: She is alert and oriented to person, place, and time.  Psychiatric:         Behavior: Behavior normal.      UC Treatments / Results  Labs (all labs ordered are listed, but only abnormal results are displayed) Labs Reviewed - No data to display  EKG   Radiology No results found.  Procedures Procedures (including critical care time)  Medications Ordered in UC Medications - No data to display  Initial Impression / Assessment and Plan / UC Course  I have reviewed the triage vital signs and the nursing notes.  Pertinent labs & imaging results that were available during my care of the patient were reviewed by me and considered in my medical decision making (see chart for details).     X-ray by my review shows some mild degenerative changes in the wrist joint.  She is advised of radiology over read  Prednisone for 5 days is sent to the pharmacy  We supplied her with a sturdier wrist brace. She is given COND information for orthopedic/hand Final Clinical Impressions(s) / UC Diagnoses   Final diagnoses:  Left wrist pain     Discharge Instructions      By my review there are some arthritis changes on your x-rays.  The radiologist will also read your x-ray, and if their interpretation differs significantly from mine, and the management of your condition would change, we will call you.  Take prednisone 20 mg--2 daily for 5 days; you can take Tylenol/acetaminophen 500 mg with this medication--take 2 Tylenol every 6 hours as needed for pain  Follow-up with your primary care about your blood pressure.  I suspect it was elevated tonight due to your pain.    ED Prescriptions     Medication Sig Dispense Auth. Provider   predniSONE (DELTASONE) 20 MG tablet Take 2 tablets (40 mg total) by mouth daily with breakfast for 5 days. 10 tablet Vonna Mashell Sieben K, MD      PDMP not reviewed this encounter.   Vonna Sharlet POUR, MD 10/30/24 [phone]

## 2024-11-01 ENCOUNTER — Other Ambulatory Visit: Payer: Self-pay | Admitting: Obstetrics and Gynecology

## 2024-11-01 ENCOUNTER — Encounter: Payer: Self-pay | Admitting: Obstetrics and Gynecology

## 2024-11-01 ENCOUNTER — Ambulatory Visit: Admitting: Obstetrics and Gynecology

## 2024-11-01 VITALS — BP 138/60 | HR 51 | Ht 66.14 in | Wt 137.8 lb

## 2024-11-01 DIAGNOSIS — N952 Postmenopausal atrophic vaginitis: Secondary | ICD-10-CM

## 2024-11-01 DIAGNOSIS — M810 Age-related osteoporosis without current pathological fracture: Secondary | ICD-10-CM | POA: Diagnosis not present

## 2024-11-01 DIAGNOSIS — Z01419 Encounter for gynecological examination (general) (routine) without abnormal findings: Secondary | ICD-10-CM

## 2024-11-01 DIAGNOSIS — M816 Localized osteoporosis [Lequesne]: Secondary | ICD-10-CM

## 2024-11-01 LAB — COMPREHENSIVE METABOLIC PANEL WITH GFR
AG Ratio: 1.4 (calc) (ref 1.0–2.5)
ALT: 7 U/L (ref 6–29)
AST: 19 U/L (ref 10–35)
Albumin: 3.9 g/dL (ref 3.6–5.1)
Alkaline phosphatase (APISO): 55 U/L (ref 37–153)
BUN: 20 mg/dL (ref 7–25)
CO2: 29 mmol/L (ref 20–32)
Calcium: 9.4 mg/dL (ref 8.6–10.4)
Chloride: 101 mmol/L (ref 98–110)
Creat: 0.79 mg/dL (ref 0.60–1.00)
Globulin: 2.8 g/dL (ref 1.9–3.7)
Glucose, Bld: 87 mg/dL (ref 65–99)
Potassium: 3.5 mmol/L (ref 3.5–5.3)
Sodium: 138 mmol/L (ref 135–146)
Total Bilirubin: 0.7 mg/dL (ref 0.2–1.2)
Total Protein: 6.7 g/dL (ref 6.1–8.1)
eGFR: 79 mL/min/1.73m2 (ref >=60)

## 2024-11-01 LAB — VITAMIN D 25 HYDROXY (VIT D DEFICIENCY, FRACTURES): Vit D, 25-Hydroxy: 35 ng/mL (ref 30–100)

## 2024-11-01 MED ORDER — DENOSUMAB 60 MG/ML ~~LOC~~ SOSY: 60.0000 mg | PREFILLED_SYRINGE | SUBCUTANEOUS | 6 refills | Status: DC

## 2024-11-01 MED ORDER — ESTRADIOL 0.01 % VA CREA: 1.0000 | TOPICAL_CREAM | Freq: Every day | VAGINAL | 0 refills | Status: AC

## 2024-11-01 NOTE — Progress Notes (Signed)
 73 y.o. y.o. female here for annual medicare gyn exam. No LMP recorded. Patient is postmenopausal.    [id number]   RP: Counseling and management of Osteoporosis     Postmenopause, well on no HRT.  No PMB.  No pelvic pain..  Pap Neg 2017. Breasts normal.  Mammo 2022. Taking Vit D supplements/Ca++ 1.5 g/d total and doing weight bearing physical activities.  BMI 20.67.  Health labs with Fam MD.  [name] 2025   HPI: Bone Density Osteoporosis on BD 01/2018, 08/04/22 with Osteoporosis at many sites.  Lowest Lt total hip/Rt femoral neck T-Score -2.6.   Mother with h/o Hip fracture.  Not on any bone medication.  Not taking Ca++ daily.  Taking Vit D.  Walking daily.  No recent fall, good balance.  No h/o fragility fracture.  Tried fosamax  and stopped due to reflux. Nothing since. Counseled on prolia. To get PA and repeat dxa at [facility].      Body mass index is 22.15 kg/m.     Blood pressure 138/60, pulse (!) 51, height 5' 6.14 (1.68 m), weight 137 lb 12.8 oz (62.5 kg), SpO2 97%.  No results found for: DIAGPAP, HPVHIGH, ADEQPAP  GYN HISTORY: No results found for: DIAGPAP, HPVHIGH, ADEQPAP  OB History  Gravida Para Term Preterm AB Living  1 0   1 0  SAB IAB Ectopic Multiple Live Births          # Outcome Date GA Lbr Len/2nd Weight Sex Type Anes PTL Lv  1 AB             Past Medical History:  Diagnosis Date   Bradycardia 08/17/2017   Bradycardia, sinus 06/02/2016   Deficiency anemia 08/17/2017   Essential hypertension 06/02/2016   Fibroid, uterine 09/17/2016   Glaucoma    Hyperlipidemia with target LDL less than 130 05/17/2014   Framingham risk score is 4%   Hypokalemia 05/11/2017   Iron deficiency anemia secondary to inadequate dietary iron intake 08/19/2017   Loss of weight 09/29/2016   Osteoporosis 01/2018   T score -2.5   Other dietary vitamin B12 deficiency anemia 08/19/2017   Postmenopausal postcoital bleeding 09/29/2016   Routine health maintenance  09/26/2011   Visit for screening mammogram 05/27/2015   Vitamin D  deficiency    Weight loss, non-intentional 09/17/2016    Past Surgical History:  Procedure Laterality Date   BREAST CYST EXCISION  12/21/2004   right    COLONOSCOPY      Current Outpatient Medications on File Prior to Visit  Medication Sig Dispense Refill   brimonidine (ALPHAGAN) 0.2 % ophthalmic solution 1 drop 2 (two) times daily.     Cyanocobalamin  (VITAMIN B 12 PO) Take 500 mg by mouth.     dorzolamide (TRUSOPT) 2 % ophthalmic solution Place 1 drop into both eyes 2 (two) times daily.     hydrochlorothiazide  (MICROZIDE ) 12.5 MG capsule TAKE 2 CAPSULES BY MOUTH EVERY DAY 90 capsule 1   Iron-Vitamins (GERITOL) LIQD Take by mouth daily.     Netarsudil-Latanoprost (ROCKLATAN) 0.02-0.005 % SOLN Apply 1 drop to eye daily at 6 (six) AM.     predniSONE (DELTASONE) 20 MG tablet Take 2 tablets (40 mg total) by mouth daily with breakfast for 5 days. 10 tablet 0   rosuvastatin  (CRESTOR ) 10 MG tablet Take 1 tablet (10 mg total) by mouth daily. 90 tablet 1   vitamin C (ASCORBIC ACID) 500 MG tablet Take 500 mg by mouth daily.     VITAMIN D   PO Take 5,000 Int'l Units by mouth daily.     alendronate  (FOSAMAX ) 70 MG tablet Take 1 tablet (70 mg total) by mouth every 7 (seven) days. Take with a full glass of water on an empty stomach. (Patient not taking: Reported on 11/01/2024) 12 tablet 4   cyanocobalamin  2000 MCG tablet Take 1 tablet (2,000 mcg total) by mouth daily. (Patient not taking: Reported on 11/01/2024) 90 tablet 3   No current facility-administered medications on file prior to visit.    Social History   Socioeconomic History   Marital status: Single    Spouse name: Not on file   Number of children: 0   Years of education: 12   Highest education level: Not on file  Occupational History   Occupation: SEAMSTRESS    Employer: [employer]  Tobacco Use   Smoking status: Never    Passive exposure: Never   Smokeless  tobacco: Never  Vaping Use   Vaping status: Never Used  Substance and Sexual Activity   Alcohol use: Yes    Alcohol/week: 1.0 standard drink of alcohol    Types: 1 Standard drinks or equivalent per week    Comment: occ   Drug use: No   Sexual activity: Yes    Partners: Male    Birth control/protection: Post-menopausal    Comment: 1st intercourse 73 yo-Fewer than 5 partners  Other Topics Concern   Not on file  Social History Narrative   HSG, no college. Single - in a relationship. No children. Work - neurosurgeon. Exercises daily. No history of abuse.   Social Drivers of Corporate Investment Banker Strain: Low Risk  (02/03/2024)   Overall Financial Resource Strain (CARDIA)    Difficulty of Paying Living Expenses: Not hard at all  Food Insecurity: No Food Insecurity (02/03/2024)   Hunger Vital Sign    Worried About Running Out of Food in the Last Year: Never true    Ran Out of Food in the Last Year: Never true  Transportation Needs: No Transportation Needs (02/03/2024)   PRAPARE - Administrator, Civil Service (Medical): No    Lack of Transportation (Non-Medical): No  Physical Activity: Sufficiently Active (02/03/2024)   Exercise Vital Sign    Days of Exercise per Week: 7 days    Minutes of Exercise per Session: 30 min  Stress: No Stress Concern Present (02/03/2024)   [employer] of Occupational Health - Occupational Stress Questionnaire    Feeling of Stress : Not at all  Social Connections: Moderately Integrated (02/03/2024)   Social Connection and Isolation Panel    Frequency of Communication with Friends and Family: More than three times a week    Frequency of Social Gatherings with Friends and Family: More than three times a week    Attends Religious Services: More than 4 times per year    Active Member of [employer] or Organizations: Yes    Attends Banker Meetings: More than 4 times per year    Marital Status: Never married  Intimate Partner  Violence: Not At Risk (02/03/2024)   Humiliation, Afraid, Rape, and Kick questionnaire    Fear of Current or Ex-Partner: No    Emotionally Abused: No    Physically Abused: No    Sexually Abused: No    Family History  Problem Relation Age of Onset   Diabetes Mother    Alcohol abuse Mother    Hypertension Father    Diabetes Sister    Hypertension Sister  Diabetes Maternal Grandmother    Heart attack Maternal Grandmother    Colon cancer Neg Hx    Colon polyps Neg Hx    Esophageal cancer Neg Hx    Rectal cancer Neg Hx    Stomach cancer Neg Hx      No Known Allergies    Patient's last menstrual period was No LMP recorded. Patient is postmenopausal..           Review of Systems Alls systems reviewed and are negative.     Physical Exam Constitutional:      Appearance: Normal appearance.  Genitourinary:     Vulva and urethral meatus normal.     No lesions in the vagina.     Right Labia: No rash, lesions or skin changes.    Left Labia: No lesions, skin changes or rash.    No vaginal discharge or tenderness.     No vaginal prolapse present.    Moderate vaginal atrophy present.     Right Adnexa: not tender, not palpable and no mass present.    Left Adnexa: not tender, not palpable and no mass present.    No cervical motion tenderness or discharge.     Uterus is not enlarged, tender or irregular.  Breasts:    Right: Normal.     Left: Normal.  HENT:     Head: Normocephalic.  Neck:     Thyroid : No thyroid  mass, thyromegaly or thyroid  tenderness.  Cardiovascular:     Rate and Rhythm: Normal rate and regular rhythm.     Heart sounds: Normal heart sounds, S1 normal and S2 normal.  Pulmonary:     Effort: Pulmonary effort is normal.     Breath sounds: Normal breath sounds and air entry.  Abdominal:     General: There is no distension.     Palpations: Abdomen is soft. There is no mass.     Tenderness: There is no abdominal tenderness. There is no guarding or rebound.   Musculoskeletal:        General: Normal range of motion.     Cervical back: Full passive range of motion without pain, normal range of motion and neck supple. No tenderness.     Right lower leg: No edema.     Left lower leg: No edema.  Neurological:     Mental Status: She is alert.  Skin:    General: Skin is warm.  Psychiatric:        Mood and Affect: Mood normal.        Behavior: Behavior normal.        Thought Content: Thought content normal.  Vitals and nursing note reviewed. Exam conducted with a chaperone present.       A:         Well Woman GYN exam Atrophic vaginitis Osteoporosis. Could not tolerate fosamax                              P:        Pap smear not indicated Encouraged annual mammogram screening Colon cancer screening up-to-date DXA ordered today. Counseled on evenity/prolia. Patient is afraid of shots and may not be able to do monthly shots. To try for prolia. [facility] request [facility] and immunizations to do with PMD Discussed breast self exams Encouraged healthy lifestyle practices Encouraged Vit D and Calcium   Estrace sent in for atrophic vaginitis. Counseled on how to use and  importance  No follow-ups on file.  Susan Mccarty

## 2024-11-01 NOTE — Patient Instructions (Signed)
 Locations you can schedule your bone scan are:  The [facility] bone scan location scheduling line is [phone]  [facility] is [phone]  [facility] radiology [phone]   [facility] [phone]  [facility]: [phone]    Another option if they are behind is [facility] and their number is [phone].  I can send the referral if you want to go there.    Please let me know if you have any trouble scheduling. This is important for management of osteoporosis or osteopenia.   I can sit down with you after the scan to discuss results and treatment.  Dr. Glennon

## 2024-11-02 ENCOUNTER — Ambulatory Visit: Payer: Self-pay | Admitting: Obstetrics and Gynecology

## 2024-11-03 NOTE — Addendum Note (Signed)
 Addended by: GLENNON ALMARIE POUR on: 11/03/2024 09:50 AM   Modules accepted: Level of Service

## 2024-11-03 NOTE — Addendum Note (Signed)
 Addended by: GLENNON ALMARIE POUR on: 11/03/2024 09:48 AM   Modules accepted: Level of Service

## 2024-11-06 ENCOUNTER — Other Ambulatory Visit: Payer: Self-pay | Admitting: Obstetrics and Gynecology

## 2024-11-06 DIAGNOSIS — E2839 Other primary ovarian failure: Secondary | ICD-10-CM

## 2024-11-06 DIAGNOSIS — M810 Age-related osteoporosis without current pathological fracture: Secondary | ICD-10-CM

## 2024-11-08 ENCOUNTER — Telehealth: Payer: Self-pay

## 2024-11-08 ENCOUNTER — Other Ambulatory Visit (HOSPITAL_COMMUNITY): Payer: Self-pay

## 2024-11-08 ENCOUNTER — Other Ambulatory Visit: Payer: Self-pay

## 2024-11-08 ENCOUNTER — Other Ambulatory Visit: Payer: Self-pay | Admitting: *Deleted

## 2024-11-08 DIAGNOSIS — M810 Age-related osteoporosis without current pathological fracture: Secondary | ICD-10-CM

## 2024-11-08 MED ORDER — DENOSUMAB 60 MG/ML ~~LOC~~ SOSY: 60.0000 mg | PREFILLED_SYRINGE | Freq: Once | SUBCUTANEOUS | Status: DC

## 2024-11-08 NOTE — Telephone Encounter (Signed)
 SABRA

## 2024-11-08 NOTE — Telephone Encounter (Signed)
 Tried to submit PA via Latent. Patient not found.      Tried to submit test claim

## 2024-11-09 NOTE — Telephone Encounter (Signed)
 Message to [facility] appts to contact patient to confirm and update insurance on file.

## 2024-11-20 ENCOUNTER — Other Ambulatory Visit (HOSPITAL_COMMUNITY): Payer: Self-pay

## 2024-11-20 NOTE — Telephone Encounter (Signed)
 Tried to call patient to see if I could get updated insurance information. No answer and voicemail has not been set up.

## 2024-12-02 LAB — HM MAMMOGRAPHY

## 2024-12-04 ENCOUNTER — Other Ambulatory Visit: Payer: Self-pay

## 2024-12-05 ENCOUNTER — Encounter: Payer: Self-pay | Admitting: Internal Medicine

## 2024-12-07 ENCOUNTER — Telehealth: Payer: Self-pay

## 2024-12-07 ENCOUNTER — Other Ambulatory Visit (HOSPITAL_COMMUNITY): Payer: Self-pay

## 2024-12-07 NOTE — Telephone Encounter (Signed)
 Patient called triage and left a message. She stated she wanted to get her lab results and that she had not heard from anyone. She also mentioned bone density medication.  Several attempts have been made to contact patient. No answer with no voicemail set up.  Patient requested to be called back at 11am. I called her back with no answer and no voicemail set up.

## 2024-12-08 ENCOUNTER — Other Ambulatory Visit (HOSPITAL_COMMUNITY): Payer: Self-pay

## 2024-12-08 NOTE — Telephone Encounter (Signed)
 Spoke with patient. See result note and Prolia  referral.   Encounter closed.

## 2024-12-08 NOTE — Telephone Encounter (Signed)
 Buy/Bill (Office supplied medication)  Out-of-pocket cost due at time of clinic visit: $347  Number of injection/visits approved: 2  Primary: HUMANA Co-insurance: *UNDISCLOSED* (Following Medicare Advantage guideline: 20% coinsurance) Admin fee co-insurance: $15  Secondary: --- Co-insurance:  Admin fee co-insurance:   Medical Benefit Details: Date Benefits were checked: 11/07/24 Deductible: NO/ Coinsurance: 20%/ Admin Fee: $15  Prior Auth: APPROVED PA# [id number] Expiration Date: 12/08/24-12/20/25   # of doses approved: 2 -----------------------------------------------------------------------  Patient NOT eligible for Copay Card. Copay Card can make patient's cost as little as $25. Link to apply: [email or web address]  ** This summary of benefits is an estimation of the patient's out-of-pocket cost. Exact cost may very based on individual plan coverage.

## 2024-12-08 NOTE — Telephone Encounter (Addendum)
 Per ID, patient's DOB is [birth year]. Submitted medical PA via latent with correct DOB. Unable to run test claim because DOB on file is incorrect.   Key: [id number]   PHARMACY COPAY: $916.71

## 2024-12-08 NOTE — Telephone Encounter (Signed)
 SABRA

## 2024-12-08 NOTE — Telephone Encounter (Signed)
 Spoke with patient. Patient request call back on Monday 12/11/24 between 0900 and 0930 at [phone].

## 2024-12-11 NOTE — Telephone Encounter (Signed)
 Medication will be filled under Medical Benefit. MTDM appointment request cancelled.

## 2024-12-27 ENCOUNTER — Other Ambulatory Visit (HOSPITAL_COMMUNITY): Payer: Self-pay

## 2024-12-27 NOTE — Telephone Encounter (Signed)
 Prolia  VOB initiated via [email or web address]  Next Prolia  inj DUE: NEW START   PHARMACY COPAY: $1,006.45

## 2024-12-27 NOTE — Telephone Encounter (Signed)
 Does this patient need to be rerun with new calendar year?

## 2025-01-02 NOTE — Telephone Encounter (Signed)
 Susan Mccarty

## 2025-01-02 NOTE — Telephone Encounter (Signed)
 Buy/Bill (Office supplied medication)  Out-of-pocket cost due at time of clinic visit: $362  Number of injection/visits approved: 2  Primary: HUMANA Co-insurance: 20% (FOLLOWING MEDICARE GUIDELINE) Admin fee co-insurance: $10  Secondary: --- Co-insurance:  Admin fee co-insurance:   Medical Benefit Details: Date Benefits were checked: 01/01/25 Deductible: NO/ Coinsurance: 20%/ Admin Fee: $10  Prior Auth: APPROVED PA# [id number] Expiration Date: 12/08/24-12/20/25   # of doses approved: 2 -----------------------------------------------------------------------  Patient NOT eligible for Copay Card. Copay Card can make patient's cost as little as $25. Link to apply: [email or web address]  ** This summary of benefits is an estimation of the patient's out-of-pocket cost. Exact cost may very based on individual plan coverage.

## 2025-01-03 NOTE — Telephone Encounter (Signed)
 Call to patient. Patient declines Prolia  at this time. States she has been on Fosamax  in the past with no side effects and would like to continue. Unsure if she needs a refill at this time. States she will check when she gets home tonight and return call to office if refill of Fosamax  is needed. Pharmacy on file confirmed.   Routing to provider as RICK.   Encounter closed.

## 2025-01-10 ENCOUNTER — Telehealth: Payer: Self-pay

## 2025-01-10 NOTE — Telephone Encounter (Signed)
 Patient called triage line stating she had a question regarding a rx. I called her and unable to leave a message due to no voicemail set up. I called her other number with no answer.

## 2025-01-11 NOTE — Telephone Encounter (Signed)
 Patient returned call. I called her back with no answer. Voicemail not set up.

## 2025-01-23 ENCOUNTER — Other Ambulatory Visit: Payer: Self-pay | Admitting: Obstetrics and Gynecology

## 2025-01-23 MED ORDER — ALENDRONATE SODIUM 70 MG PO TABS: 70.0000 mg | ORAL_TABLET | ORAL | 6 refills | Status: AC

## 2025-01-23 NOTE — Telephone Encounter (Signed)
 Spoke with patient. Patient declines Prolia , does not want to proceed. Rx discontinued.  Patient is requesting refill of fosamax  to pharmacy on file.   Advised I will review request with Dr. Glennon and our office will f/u with recommendations once reviewed. Patient agreeable.   Last AEX 11/01/24 BMD 08/04/22 Per review of 11/06/24 BMD order, patient declines due to [facility] cost  Dr. Glennon -please advise on Rx

## 2025-01-23 NOTE — Addendum Note (Signed)
 Addended by: Shawntavia Saunders N on: 01/23/2025 03:28 PM   Modules accepted: Orders

## 2025-01-23 NOTE — Telephone Encounter (Signed)
 Patient notified. Encounter closed

## 2025-01-23 NOTE — Telephone Encounter (Signed)
No call back from patient.  Encounter closed.   

## 2025-02-06 ENCOUNTER — Encounter: Admitting: Internal Medicine

## 2025-02-06 ENCOUNTER — Ambulatory Visit: Payer: Medicare HMO

## 2025-02-14 ENCOUNTER — Ambulatory Visit: Admitting: Student in an Organized Health Care Education/Training Program

## 2025-03-06 ENCOUNTER — Ambulatory Visit: Admitting: Student in an Organized Health Care Education/Training Program
